# Patient Record
Sex: Female | Born: 1955
Health system: Southern US, Community
[De-identification: ages and names within clinical notes are randomized; demographics above are authoritative.]

## PROBLEM LIST (undated history)

## (undated) ENCOUNTER — Emergency Department (HOSPITAL_COMMUNITY): Discharge status: Absent without leave | Payer: 59

## (undated) DIAGNOSIS — T7840XA Allergy, unspecified, initial encounter: Secondary | ICD-10-CM

## (undated) DIAGNOSIS — Z8051 Family history of malignant neoplasm of kidney: Secondary | ICD-10-CM

## (undated) DIAGNOSIS — F32A Depression, unspecified: Secondary | ICD-10-CM

## (undated) DIAGNOSIS — K589 Irritable bowel syndrome without diarrhea: Secondary | ICD-10-CM

## (undated) DIAGNOSIS — K219 Gastro-esophageal reflux disease without esophagitis: Secondary | ICD-10-CM

## (undated) DIAGNOSIS — Z923 Personal history of irradiation: Secondary | ICD-10-CM

## (undated) DIAGNOSIS — G43909 Migraine, unspecified, not intractable, without status migrainosus: Secondary | ICD-10-CM

## (undated) DIAGNOSIS — F329 Major depressive disorder, single episode, unspecified: Secondary | ICD-10-CM

## (undated) DIAGNOSIS — Z800 Family history of malignant neoplasm of digestive organs: Secondary | ICD-10-CM

## (undated) DIAGNOSIS — Z803 Family history of malignant neoplasm of breast: Secondary | ICD-10-CM

## (undated) DIAGNOSIS — C50919 Malignant neoplasm of unspecified site of unspecified female breast: Secondary | ICD-10-CM

## (undated) DIAGNOSIS — M8580 Other specified disorders of bone density and structure, unspecified site: Secondary | ICD-10-CM

## (undated) DIAGNOSIS — H269 Unspecified cataract: Secondary | ICD-10-CM

## (undated) DIAGNOSIS — F419 Anxiety disorder, unspecified: Secondary | ICD-10-CM

## (undated) DIAGNOSIS — Z8041 Family history of malignant neoplasm of ovary: Secondary | ICD-10-CM

## (undated) DIAGNOSIS — K2270 Barrett's esophagus without dysplasia: Secondary | ICD-10-CM

## (undated) HISTORY — DX: Family history of malignant neoplasm of digestive organs: Z80.0

## (undated) HISTORY — DX: Migraine, unspecified, not intractable, without status migrainosus: G43.909

## (undated) HISTORY — DX: Family history of malignant neoplasm of kidney: Z80.51

## (undated) HISTORY — DX: Family history of malignant neoplasm of ovary: Z80.41

## (undated) HISTORY — DX: Gastro-esophageal reflux disease without esophagitis: K21.9

## (undated) HISTORY — PX: EYE SURGERY: SHX253

## (undated) HISTORY — DX: Barrett's esophagus without dysplasia: K22.70

## (undated) HISTORY — DX: Other specified disorders of bone density and structure, unspecified site: M85.80

## (undated) HISTORY — DX: Irritable bowel syndrome, unspecified: K58.9

## (undated) HISTORY — DX: Depression, unspecified: F32.A

## (undated) HISTORY — PX: CYSTOSCOPY: SUR368

## (undated) HISTORY — DX: Family history of malignant neoplasm of breast: Z80.3

## (undated) HISTORY — DX: Allergy, unspecified, initial encounter: T78.40XA

## (undated) HISTORY — PX: BREAST LUMPECTOMY: SHX2

## (undated) HISTORY — DX: Major depressive disorder, single episode, unspecified: F32.9

## (undated) HISTORY — DX: Unspecified cataract: H26.9

---
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
## 1976-04-05 HISTORY — PX: MOUTH SURGERY: SHX715

## 1994-04-05 HISTORY — PX: CERVICAL CONE BIOPSY: SUR198

## 1999-02-05 ENCOUNTER — Encounter: Admission: RE | Admit: 1999-02-05 | Discharge: 1999-02-05 | Payer: Self-pay | Admitting: Obstetrics and Gynecology

## 1999-02-05 ENCOUNTER — Encounter: Payer: Self-pay | Admitting: Obstetrics and Gynecology

## 2000-05-12 ENCOUNTER — Encounter: Admission: RE | Admit: 2000-05-12 | Discharge: 2000-05-12 | Payer: Self-pay | Admitting: Obstetrics and Gynecology

## 2000-05-12 ENCOUNTER — Encounter: Payer: Self-pay | Admitting: Obstetrics and Gynecology

## 2000-06-29 ENCOUNTER — Encounter: Payer: Self-pay | Admitting: Family Medicine

## 2000-06-29 ENCOUNTER — Ambulatory Visit (HOSPITAL_COMMUNITY): Admission: RE | Admit: 2000-06-29 | Discharge: 2000-06-29 | Payer: Self-pay | Admitting: Family Medicine

## 2001-06-26 ENCOUNTER — Ambulatory Visit (HOSPITAL_COMMUNITY): Admission: RE | Admit: 2001-06-26 | Discharge: 2001-06-26 | Payer: Self-pay | Admitting: Family Medicine

## 2001-06-26 ENCOUNTER — Encounter: Payer: Self-pay | Admitting: Family Medicine

## 2001-08-07 ENCOUNTER — Encounter: Admission: RE | Admit: 2001-08-07 | Discharge: 2001-08-07 | Payer: Self-pay | Admitting: Obstetrics and Gynecology

## 2001-08-07 ENCOUNTER — Encounter: Payer: Self-pay | Admitting: Obstetrics and Gynecology

## 2002-05-28 ENCOUNTER — Ambulatory Visit (HOSPITAL_COMMUNITY): Admission: RE | Admit: 2002-05-28 | Discharge: 2002-05-28 | Payer: Self-pay | Admitting: Gastroenterology

## 2002-05-28 ENCOUNTER — Encounter (INDEPENDENT_AMBULATORY_CARE_PROVIDER_SITE_OTHER): Payer: Self-pay | Admitting: *Deleted

## 2002-06-07 ENCOUNTER — Encounter: Payer: Self-pay | Admitting: Gastroenterology

## 2002-06-07 ENCOUNTER — Encounter: Admission: RE | Admit: 2002-06-07 | Discharge: 2002-06-07 | Payer: Self-pay | Admitting: Gastroenterology

## 2003-03-06 ENCOUNTER — Encounter: Admission: RE | Admit: 2003-03-06 | Discharge: 2003-03-06 | Payer: Self-pay | Admitting: Obstetrics and Gynecology

## 2004-07-06 ENCOUNTER — Encounter: Admission: RE | Admit: 2004-07-06 | Discharge: 2004-07-06 | Payer: Self-pay | Admitting: Obstetrics and Gynecology

## 2007-03-20 ENCOUNTER — Encounter: Admission: RE | Admit: 2007-03-20 | Discharge: 2007-03-20 | Payer: Self-pay | Admitting: Specialist

## 2007-07-21 HISTORY — PX: COLONOSCOPY: SHX174

## 2009-09-04 ENCOUNTER — Ambulatory Visit (HOSPITAL_COMMUNITY): Admission: RE | Admit: 2009-09-04 | Discharge: 2009-09-04 | Payer: Self-pay | Admitting: Obstetrics and Gynecology

## 2010-03-18 ENCOUNTER — Ambulatory Visit (HOSPITAL_COMMUNITY)
Admission: RE | Admit: 2010-03-18 | Discharge: 2010-03-18 | Payer: Self-pay | Source: Home / Self Care | Attending: Family Medicine | Admitting: Family Medicine

## 2010-04-26 ENCOUNTER — Encounter: Payer: Self-pay | Admitting: Family Medicine

## 2010-08-18 ENCOUNTER — Other Ambulatory Visit: Payer: Self-pay | Admitting: Family Medicine

## 2010-08-18 DIAGNOSIS — Z1231 Encounter for screening mammogram for malignant neoplasm of breast: Secondary | ICD-10-CM

## 2010-08-21 NOTE — Op Note (Signed)
NAME:  Carolyn Cooper, Carolyn Cooper                         ACCOUNT NO.:  [id number]   MEDICAL RECORD NO.:  [id number]                   PATIENT TYPE:  AMB   LOCATION:  ENDO                                 FACILITY:  [facility]   PHYSICIAN:  Petra Kuba, M.D.                 DATE OF BIRTH:  18-Jun-1955   DATE OF PROCEDURE:  05/28/2002  DATE OF DISCHARGE:                                 OPERATIVE REPORT   PROCEDURE:  Colonoscopy with biopsy.   ENDOSCOPIST:  Petra Kuba, M.D.   INDICATIONS FOR PROCEDURE:  Bright red blood per  rectum, diarrhea.  A consent was signed after the risks, benefits, methods and options were  thoroughly discussed in the office.   MEDICINES USED:  Demerol 60 mg, Versed 6 mg.   DESCRIPTION OF PROCEDURE:  The rectal inspection was pertinent for external  hemorrhoids, small.  A digital examination was negative.  The pediatric  adjustable colonoscope was inserted and advanced around the colon to the  cecum.  This did require rolling her on her back and some abdominal  pressure.  No obvious abnormality was seen on insertion.  The cecum was  identified by the appendiceal orifice and the ileocecal valve.  In fact the  scope was inserted a very short ways into the terminal ileum which was  normal.  Photo documentation and biopsies were obtained.  The scope was  slowly withdrawn.  Random colon biopsies were obtained and put in a second  container.  The prep was adequate.  There was some liquid stool that  required washing and suctioning.  The scope was slowly withdrawn through the  colon.  No abnormalities were seen.  Specifically, no polyps, masses,  diverticula, or signs of colitis.  Once back in the rectum, the scope was  retroflexed.  There were some internal hemorrhoids.  The scope was drained  and readvanced a short ways up the left side of the colon.  Air was  suctioned and the scope removed.  The patient tolerated the procedure well.  There was no obvious immediate  complication.   ENDOSCOPIC ASSESSMENT:  1. Internal and external hemorrhoids.  2. Otherwise within normal limits to the terminal ileum, status post ransom     biopsies throughout.   PLAN:  1. Await pathology to rule out microscopic abnormality.  2. A trial of antispasmodics.  3.     Probably will proceed with an upper GI, small bowel follow-through next.  4. Have her to see me back p.r.n. or in two months, recheck symptoms.  Make     sure that no further workup plans are needed.                                               Lavada Mesi  Magod, M.D.    MEM/MEDQ  D:  05/28/2002  T:  05/28/2002  Job:  [id number]   cc:   Magnus Sinning. Dimple Casey, M.D.  [address] [place]  [place] [address]  Fax: [phone]

## 2010-09-08 ENCOUNTER — Ambulatory Visit (HOSPITAL_COMMUNITY)
Admission: RE | Admit: 2010-09-08 | Discharge: 2010-09-08 | Disposition: A | Discharge status: Home / Self Care | Payer: 59 | Source: Ambulatory Visit | Attending: Family Medicine | Admitting: Family Medicine

## 2010-09-08 DIAGNOSIS — Z1231 Encounter for screening mammogram for malignant neoplasm of breast: Secondary | ICD-10-CM

## 2010-09-28 ENCOUNTER — Ambulatory Visit (INDEPENDENT_AMBULATORY_CARE_PROVIDER_SITE_OTHER): Payer: 59 | Admitting: Gynecology

## 2010-09-28 DIAGNOSIS — K648 Other hemorrhoids: Secondary | ICD-10-CM

## 2010-09-28 DIAGNOSIS — N950 Postmenopausal bleeding: Secondary | ICD-10-CM

## 2010-10-01 ENCOUNTER — Ambulatory Visit (HOSPITAL_COMMUNITY)
Admission: RE | Admit: 2010-10-01 | Discharge: 2010-10-01 | Disposition: A | Discharge status: Home / Self Care | Payer: 59 | Source: Ambulatory Visit | Attending: Family Medicine | Admitting: Family Medicine

## 2010-10-01 DIAGNOSIS — Z1231 Encounter for screening mammogram for malignant neoplasm of breast: Secondary | ICD-10-CM | POA: Insufficient documentation

## 2010-10-06 ENCOUNTER — Other Ambulatory Visit: Payer: 59

## 2010-10-06 ENCOUNTER — Other Ambulatory Visit: Payer: Self-pay | Admitting: Gynecology

## 2010-10-06 ENCOUNTER — Ambulatory Visit (INDEPENDENT_AMBULATORY_CARE_PROVIDER_SITE_OTHER): Payer: 59 | Admitting: Gynecology

## 2010-10-06 DIAGNOSIS — N882 Stricture and stenosis of cervix uteri: Secondary | ICD-10-CM

## 2010-10-06 DIAGNOSIS — N800 Endometriosis of uterus: Secondary | ICD-10-CM

## 2010-10-06 DIAGNOSIS — N83339 Acquired atrophy of ovary and fallopian tube, unspecified side: Secondary | ICD-10-CM

## 2010-10-06 DIAGNOSIS — N950 Postmenopausal bleeding: Secondary | ICD-10-CM

## 2011-06-07 ENCOUNTER — Ambulatory Visit: Payer: 59 | Attending: Family Medicine | Admitting: Physical Therapy

## 2011-06-07 DIAGNOSIS — M25676 Stiffness of unspecified foot, not elsewhere classified: Secondary | ICD-10-CM | POA: Insufficient documentation

## 2011-06-07 DIAGNOSIS — R5381 Other malaise: Secondary | ICD-10-CM | POA: Insufficient documentation

## 2011-06-07 DIAGNOSIS — IMO0001 Reserved for inherently not codable concepts without codable children: Secondary | ICD-10-CM | POA: Insufficient documentation

## 2011-06-07 DIAGNOSIS — M25673 Stiffness of unspecified ankle, not elsewhere classified: Secondary | ICD-10-CM | POA: Insufficient documentation

## 2011-06-07 DIAGNOSIS — M25579 Pain in unspecified ankle and joints of unspecified foot: Secondary | ICD-10-CM | POA: Insufficient documentation

## 2011-06-10 ENCOUNTER — Ambulatory Visit: Payer: 59 | Admitting: Physical Therapy

## 2011-06-14 ENCOUNTER — Ambulatory Visit: Payer: 59 | Admitting: Physical Therapy

## 2011-06-17 ENCOUNTER — Ambulatory Visit: Payer: 59 | Admitting: *Deleted

## 2011-06-21 ENCOUNTER — Ambulatory Visit: Payer: 59 | Admitting: Physical Therapy

## 2011-09-03 ENCOUNTER — Ambulatory Visit (INDEPENDENT_AMBULATORY_CARE_PROVIDER_SITE_OTHER): Payer: 59 | Admitting: Gynecology

## 2011-09-03 ENCOUNTER — Encounter: Payer: Self-pay | Admitting: Gynecology

## 2011-09-03 VITALS — BP 112/78 | Ht 65.25 in | Wt 164.0 lb

## 2011-09-03 DIAGNOSIS — M899 Disorder of bone, unspecified: Secondary | ICD-10-CM

## 2011-09-03 DIAGNOSIS — M8580 Other specified disorders of bone density and structure, unspecified site: Secondary | ICD-10-CM

## 2011-09-03 DIAGNOSIS — Z01419 Encounter for gynecological examination (general) (routine) without abnormal findings: Secondary | ICD-10-CM

## 2011-09-03 DIAGNOSIS — Z79890 Hormone replacement therapy (postmenopausal): Secondary | ICD-10-CM

## 2011-09-03 DIAGNOSIS — K644 Residual hemorrhoidal skin tags: Secondary | ICD-10-CM

## 2011-09-03 MED ORDER — MEDROXYPROGESTERONE ACETATE 2.5 MG PO TABS: 2.5000 mg | ORAL_TABLET | Freq: Every day | ORAL | Status: DC

## 2011-09-03 MED ORDER — ESTRADIOL 1 MG PO TABS: 1.0000 mg | ORAL_TABLET | Freq: Every day | ORAL | Status: DC

## 2011-09-03 NOTE — Patient Instructions (Signed)
Wean from hormone replacement as we discussed. Follow up if any issues. Follow up for mammogram as scheduled. Follow up for repeat bone density as discussed with your primary physician. Follow up for repeat annual gynecologic exam in 1 year

## 2011-09-03 NOTE — Progress Notes (Signed)
Carolyn Cooper 06-06-1955 [id number]        56 y.o.  for annual exam.  Several issues noted below.  Past medical history,surgical history, medications, allergies, family history and social history were all reviewed and documented in the EPIC chart. ROS:  Was performed and pertinent positives and negatives are included in the history.  Exam: Carolyn Cooper chaperone present Filed Vitals:   09/03/11 1115  BP: 112/78   General appearance  Normal Skin grossly normal Head/Neck normal with no cervical or supraclavicular adenopathy thyroid normal Lungs  clear Cardiac RR, without RMG Abdominal  soft, nontender, without masses, organomegaly or hernia Breasts  examined lying and sitting without masses, retractions, discharge or axillary adenopathy. Pelvic  Ext/BUS/vagina  normal   Cervix  normal with stenotic os noted  Uterus  axial, normal size, shape and contour, midline and mobile nontender   Adnexa  Without masses or tenderness    Anus and perineum  normal   Rectovaginal  normal sphincter tone without palpated masses or tenderness. External hemorrhoids noted.   Assessment/Plan:  56 y.o. female for annual exam.    1. HRT. Patient continues on Estrace 1 mg and Provera 2.5 mg. I again reviewed the issues of HRT in the WHI study with increased risk of stroke, heart attack, DVT and breast cancer. Patient wants to try to wean. I suggested decreasing 2.5 mg x2 weeks of Estrace then every other night then stopping. If she does well that she will stay off of this. If she has resumption of significant hot flushes she will restart her HRT accepting the risks as we reviewed. She did have a light menses in October. She underwent sonohysterogram with endometrial biopsy July 2012 which showed proliferative endometrium and no cavity defects. She's done no bleeding since October. 2. Mammography. Patient is due for mammogram in July and she knows to schedule this. SBE monthly reviewed. 3. Pap smear. Patient does have  history of cone biopsy in 96 with normal Pap smears since then. Her last Pap smear was April 2012 which was normal with negative HPV screen.  Will plan repeat at 5 your interval per current screening guidelines. 4. Osteopenia. Patient reports last DEXA 5 years ago through her primary physician's office. Have recommended repeat now she: Arrange this with them. Vitamin D level last year good at 65. 5. Colonoscopy. She is up-to-date with screening and is due to have this repeated in 2 years. 6. Health maintenance. No blood work was ordered today as she has a normal lipid profile glucose TSH last year. Assuming she continues well from a gynecologic standpoint and she will see me in a year, sooner as needed.    Dara Lords MD, 11:36 AM 09/03/2011

## 2011-09-04 LAB — URINALYSIS W MICROSCOPIC + REFLEX CULTURE
Bilirubin Urine: NEGATIVE
Casts: NONE SEEN
Glucose, UA: NEGATIVE mg/dL
Hgb urine dipstick: NEGATIVE
Leukocytes, UA: NEGATIVE
Protein, ur: NEGATIVE mg/dL
pH: 6 (ref 5.0–8.0)

## 2011-09-08 ENCOUNTER — Other Ambulatory Visit: Payer: Self-pay | Admitting: Gynecology

## 2011-09-08 DIAGNOSIS — Z1231 Encounter for screening mammogram for malignant neoplasm of breast: Secondary | ICD-10-CM

## 2011-10-05 ENCOUNTER — Ambulatory Visit (HOSPITAL_COMMUNITY)
Admission: RE | Admit: 2011-10-05 | Discharge: 2011-10-05 | Disposition: A | Discharge status: Home / Self Care | Payer: 59 | Source: Ambulatory Visit | Attending: Gynecology | Admitting: Gynecology

## 2011-10-05 DIAGNOSIS — Z1231 Encounter for screening mammogram for malignant neoplasm of breast: Secondary | ICD-10-CM | POA: Insufficient documentation

## 2011-12-01 ENCOUNTER — Encounter: Payer: Self-pay | Admitting: Gynecology

## 2012-04-29 ENCOUNTER — Telehealth: Payer: Self-pay

## 2012-04-29 NOTE — Telephone Encounter (Signed)
Pt needs refill on her adderall please call her at [phone]

## 2012-05-01 NOTE — Telephone Encounter (Signed)
Please pull paper chart.  

## 2012-05-01 NOTE — Telephone Encounter (Signed)
Message taken for wrong patient

## 2012-07-14 ENCOUNTER — Ambulatory Visit (INDEPENDENT_AMBULATORY_CARE_PROVIDER_SITE_OTHER): Payer: 59 | Admitting: *Deleted

## 2012-07-14 ENCOUNTER — Telehealth: Payer: Self-pay | Admitting: *Deleted

## 2012-07-14 VITALS — BP 112/73 | HR 84 | Temp 98.1°F

## 2012-07-14 DIAGNOSIS — R110 Nausea: Secondary | ICD-10-CM

## 2012-07-14 MED ORDER — PROMETHAZINE HCL 25 MG/ML IJ SOLN
25.0000 mg | Freq: Once | INTRAMUSCULAR | Status: AC
  Administered 2012-07-14: 25 mg via INTRAMUSCULAR

## 2012-07-14 NOTE — Patient Instructions (Addendum)
Viral Gastroenteritis Viral gastroenteritis is also known as stomach flu. This condition affects the stomach and intestinal tract. It can cause sudden diarrhea and vomiting. The illness typically lasts 3 to 8 days. Most people develop an immune response that eventually gets rid of the virus. While this natural response develops, the virus can make you quite ill. CAUSES  Many different viruses can cause gastroenteritis, such as rotavirus or noroviruses. You can catch one of these viruses by consuming contaminated food or water. You may also catch a virus by sharing utensils or other personal items with an infected person or by touching a contaminated surface. SYMPTOMS  The most common symptoms are diarrhea and vomiting. These problems can cause a severe loss of body fluids (dehydration) and a body salt (electrolyte) imbalance. Other symptoms may include:  Fever.  Headache.  Fatigue.  Abdominal pain. DIAGNOSIS  Your caregiver can usually diagnose viral gastroenteritis based on your symptoms and a physical exam. A stool sample may also be taken to test for the presence of viruses or other infections. TREATMENT  This illness typically goes away on its own. Treatments are aimed at rehydration. The most serious cases of viral gastroenteritis involve vomiting so severely that you are not able to keep fluids down. In these cases, fluids must be given through an intravenous line (IV). HOME CARE INSTRUCTIONS   Drink enough fluids to keep your urine clear or pale yellow. Drink small amounts of fluids frequently and increase the amounts as tolerated.  Ask your caregiver for specific rehydration instructions.  Avoid:  Foods high in sugar.  Alcohol.  Carbonated drinks.  Tobacco.  Juice.  Caffeine drinks.  Extremely hot or cold fluids.  Fatty, greasy foods.  Too much intake of anything at one time.  Dairy products until 24 to 48 hours after diarrhea stops.  You may consume probiotics.  Probiotics are active cultures of beneficial bacteria. They may lessen the amount and number of diarrheal stools in adults. Probiotics can be found in yogurt with active cultures and in supplements.  Wash your hands well to avoid spreading the virus.  Only take over-the-counter or prescription medicines for pain, discomfort, or fever as directed by your caregiver. Do not give aspirin to children. Antidiarrheal medicines are not recommended.  Ask your caregiver if you should continue to take your regular prescribed and over-the-counter medicines.  Keep all follow-up appointments as directed by your caregiver. SEEK IMMEDIATE MEDICAL CARE IF:   You are unable to keep fluids down.  You do not urinate at least once every 6 to 8 hours.  You develop shortness of breath.  You notice blood in your stool or vomit. This may look like coffee grounds.  You have abdominal pain that increases or is concentrated in one small area (localized).  You have persistent vomiting or diarrhea.  You have a fever.  The patient is a child younger than 3 months, and he or she has a fever.  The patient is a child older than 3 months, and he or she has a fever and persistent symptoms.  The patient is a child older than 3 months, and he or she has a fever and symptoms suddenly get worse.  The patient is a baby, and he or she has no tears when crying. MAKE SURE YOU:   Understand these instructions.  Will watch your condition.  Will get help right away if you are not doing well or get worse. Document Released: 03/22/2005 Document Revised: 06/14/2011 Document Reviewed: 01/06/2011   ExitCare Patient Information 2013 [place], [place].   Promethazine injection What is this medicine? PROMETHAZINE (proe METH a zeen) is an antihistamine. It is used to treat allergic reactions and to treat or prevent nausea and vomiting from illness or motion sickness. It is also used to make you sleep before surgery, and to help  treat pain or nausea after surgery. This medicine may be used for other purposes; ask your health care provider or pharmacist if you have questions. What should I tell my health care provider before I take this medicine? They need to know if you have any of these conditions: -glaucoma -high blood pressure or heart disease -kidney disease -liver disease -lung or breathing disease, like asthma -prostate trouble -pain or difficulty passing urine -seizures -an unusual or allergic reaction to promethazine or phenothiazines, other medicines, foods, dyes, or preservatives -pregnant or trying to get pregnant -breast-feeding How should I use this medicine? This medicine is for injection into a muscle, or into a vein. It is given by a health care professional in a hospital or clinic setting. Talk to your pediatrician regarding the use of this medicine in children. This medicine should not be given to infants and children younger than 36 years old. Overdosage: If you think you have taken too much of this medicine contact a poison control center or emergency room at once. NOTE: This medicine is only for you. Do not share this medicine with others. What if I miss a dose? This does not apply. What may interact with this medicine? Do not take this medicine with any of the following medications: -medicines called MAO Inhibitors like Nardil, Parnate, Marplan, Eldepryl -other phenothiazines like trimethobenzamide This medicine may also interact with the following medications: -barbiturates like phenobarbital -bromocriptine -certain antidepressants -certain antihistamines used in allergy or cold medicines -epinephrine -levodopa -medicines for sleep -medicines for mental problems and psychotic disturbances -medicines for movement abnormalities as in Parkinson's disease, or for gastrointestinal problems -muscle relaxants -prescription pain medicines This list may not describe all possible  interactions. Give your health care provider a list of all the medicines, herbs, non-prescription drugs, or dietary supplements you use. Also tell them if you smoke, drink alcohol, or use illegal drugs. Some items may interact with your medicine. What should I watch for while using this medicine? Your condition will be monitored carefully while you are receiving this medicine. Your healthcare professional will discuss with you the risks and the benefits of using this medicine. This medicine has caused serious side effects in some patients after it was injected into a vein. Watch closely for any signs or symptoms of a local reaction like burning, pain, redness, swelling, and blistering and tell your healthcare professional immediately if any occur. These symptoms may occur when you receive the injection or may occur hours or even days after the injection. You may get drowsy or dizzy. Do not drive, use machinery, or do anything that needs mental alertness until you know how this medicine affects you. To reduce the risk of dizzy or fainting spells, do not stand or sit up quickly, especially if you are an older patient. Alcohol may increase dizziness and drowsiness. Avoid alcoholic drinks. Your mouth may get dry. Chewing sugarless gum or sucking hard candy, and drinking plenty of water will help. This medicine may cause dry eyes and blurred vision. If you wear contact lenses you may feel some discomfort. Lubricating drops may help. See your eye doctor if the problem does not go away or is severe.  Keep out of the sun, or wear protective clothing outdoors and use a sunscreen. Do not use sun lamps or sun tanning beds or booths. If you are diabetic, check your blood-sugar levels regularly. What side effects may I notice from receiving this medicine? Side effects that you should report to your doctor or health care professional as soon as possible: -allergic reactions like skin rash, itching or hives, swelling of  the face, lips, or tongue -blurred vision -burning, blistering, pain, redness, and/or swelling at the injection site -irregular heartbeat, palpitations or chest pain -muscle or facial twitches -pain or difficulty passing urine -seizures -slowed or shallow breathing -unusual bleeding or bruising -yellowing of the eyes or skin Side effects that usually do not require medical attention (report to your doctor or health care professional if they continue or are bothersome): -headache -nightmares, agitation, nervousness, excitability, not able to sleep (these are more likely in children) -stuffy nose This list may not describe all possible side effects. Call your doctor for medical advice about side effects. You may report side effects to FDA at 1-800-FDA-1088. Where should I keep my medicine? This drug is given in a hospital or clinic and will not be stored at home. NOTE: This sheet is a summary. It may not cover all possible information. If you have questions about this medicine, talk to your doctor, pharmacist, or health care provider.  2013, Elsevier/Gold Standard. (12/20/2007 4:23:31 PM)  Patient to go home and rest.

## 2012-07-14 NOTE — Progress Notes (Signed)
Pt complains of abd discomfort and nausea x 1 day.  Has not vomited at this point.  Several close contacts have had vomiting and diarrhea over the past couple of days.  She is requesting something for nausea.

## 2012-07-14 NOTE — Telephone Encounter (Signed)
Discussed with Dr. Christell Constant patient will receive Phenergan 25mg  injection and will go home to rest.

## 2012-08-15 ENCOUNTER — Ambulatory Visit: Payer: 59

## 2012-08-16 ENCOUNTER — Ambulatory Visit: Payer: 59

## 2012-08-21 ENCOUNTER — Ambulatory Visit: Payer: 59

## 2012-09-11 ENCOUNTER — Telehealth: Payer: Self-pay

## 2012-09-11 NOTE — Telephone Encounter (Signed)
error 

## 2012-09-14 ENCOUNTER — Other Ambulatory Visit: Payer: Self-pay | Admitting: Gynecology

## 2012-09-22 ENCOUNTER — Other Ambulatory Visit: Payer: Self-pay | Admitting: Nurse Practitioner

## 2012-09-25 ENCOUNTER — Encounter: Payer: Self-pay | Admitting: Family Medicine

## 2012-10-03 ENCOUNTER — Other Ambulatory Visit: Payer: Self-pay | Admitting: Gynecology

## 2012-10-03 DIAGNOSIS — Z1231 Encounter for screening mammogram for malignant neoplasm of breast: Secondary | ICD-10-CM

## 2012-10-11 ENCOUNTER — Ambulatory Visit (INDEPENDENT_AMBULATORY_CARE_PROVIDER_SITE_OTHER): Payer: 59 | Admitting: Gynecology

## 2012-10-11 ENCOUNTER — Encounter: Payer: Self-pay | Admitting: Gynecology

## 2012-10-11 VITALS — BP 122/74 | Ht 64.75 in | Wt 167.0 lb

## 2012-10-11 DIAGNOSIS — M899 Disorder of bone, unspecified: Secondary | ICD-10-CM

## 2012-10-11 DIAGNOSIS — M8580 Other specified disorders of bone density and structure, unspecified site: Secondary | ICD-10-CM

## 2012-10-11 DIAGNOSIS — Z79890 Hormone replacement therapy (postmenopausal): Secondary | ICD-10-CM

## 2012-10-11 DIAGNOSIS — Z01419 Encounter for gynecological examination (general) (routine) without abnormal findings: Secondary | ICD-10-CM

## 2012-10-11 DIAGNOSIS — Z13220 Encounter for screening for lipoid disorders: Secondary | ICD-10-CM

## 2012-10-11 DIAGNOSIS — M949 Disorder of cartilage, unspecified: Secondary | ICD-10-CM

## 2012-10-11 LAB — LIPID PANEL
LDL Cholesterol: 88 mg/dL (ref 0–99)
Total CHOL/HDL Ratio: 2.5 Ratio
VLDL: 16 mg/dL (ref 0–40)

## 2012-10-11 LAB — TSH: TSH: 2.444 u[IU]/mL (ref 0.350–4.500)

## 2012-10-11 LAB — COMPREHENSIVE METABOLIC PANEL
ALT: 11 U/L (ref 0–35)
AST: 16 U/L (ref 0–37)
Alkaline Phosphatase: 89 U/L (ref 39–117)
Creat: 0.79 mg/dL (ref 0.50–1.10)
Sodium: 141 mEq/L (ref 135–145)
Total Bilirubin: 0.3 mg/dL (ref 0.3–1.2)

## 2012-10-11 LAB — CBC WITH DIFFERENTIAL/PLATELET
Basophils Absolute: 0.1 10*3/uL (ref 0.0–0.1)
Eosinophils Absolute: 0.1 10*3/uL (ref 0.0–0.7)
Eosinophils Relative: 1 % (ref 0–5)
Lymphocytes Relative: 25 % (ref 12–46)
MCH: 31.8 pg (ref 26.0–34.0)
MCV: 92.6 fL (ref 78.0–100.0)
Neutrophils Relative %: 66 % (ref 43–77)
Platelets: 287 10*3/uL (ref 150–400)
RDW: 13.2 % (ref 11.5–15.5)
WBC: 7.7 10*3/uL (ref 4.0–10.5)

## 2012-10-11 MED ORDER — ESTRADIOL 1 MG PO TABS: 1.0000 mg | ORAL_TABLET | Freq: Every day | ORAL | Status: DC

## 2012-10-11 MED ORDER — MEDROXYPROGESTERONE ACETATE 2.5 MG PO TABS: ORAL_TABLET | ORAL | Status: DC

## 2012-10-11 NOTE — Progress Notes (Signed)
Carolyn Cooper [birth year] [id number]        57 y.o.  G4P4 for annual exam.  Several issues noted below.  Past medical history,surgical history, medications, allergies, family history and social history were all reviewed and documented in the EPIC chart.  ROS:  Performed and pertinent positives and negatives are included in the history, assessment and plan .  Exam: Biomedical scientist Filed Vitals:   10/11/12 1501  BP: 122/74  Height: 5' 4.75" (1.645 m)  Weight: 167 lb (75.751 kg)   General appearance  Normal Skin grossly normal Head/Neck normal with no cervical or supraclavicular adenopathy thyroid normal Lungs  clear Cardiac RR, without RMG Abdominal  soft, nontender, without masses, organomegaly or hernia Breasts  examined lying and sitting without masses, retractions, discharge or axillary adenopathy. Pelvic  Ext/BUS/vagina  normal   Cervix  normal   Uterus  anteverted, normal size, shape and contour, midline and mobile nontender   Adnexa  Without masses or tenderness    Anus and perineum  normal   Rectovaginal  normal sphincter tone without palpated masses or tenderness.    Assessment/Plan:  57 y.o. G46P4 female for annual exam.   1. HRT. Patient is on Estrace 1 mg and Provera 2.5 mg doing well. She had tried weaning this past year and had unacceptable hot flushes and restarted.  I again reviewed the whole issue of HRT with her to include the WHI study with increased risk of stroke, heart attack, DVT and breast cancer. The ACOG and NAMS statements for lowest dose for the shortest period of time reviewed. Transdermal versus oral first-pass effect benefit discussed as well as whether naturalized progesterone is "safer". She has been on the current regimen for some time and prefers to remain on this.. I did ask her again during the winter months to try weaning and she will go ahead and do this. I refilled her estradiol 1 mg and Provera 2.5 mg daily. She's done no bleeding and knows to  report any vaginal bleeding. 2. Osteopenia. DEXA 2012 at her work showed T score -1.3. Recommend repeat this year at a 2 year interval. Increase calcium vitamin D reviewed. Check vitamin D level today. 3. Mammography due now and patient is to schedule this. SBE monthly reviewed. 4. Pap smear 2012. No Pap smear done today. Patient does have history of cone biopsy 1996 with subsequent Pap smears normal. Plan repeat Pap smear next year 3 year interval. 5. Colonoscopy 4 years ago. Repeat at their recommended interval. 6. Health maintenance. Baseline CBC comprehensive metabolic panel lipid profile TSH vitamin D urinalysis ordered. Followup one year, sooner as needed.    Note: This document was prepared with digital dictation and possible smart phrase technology. Any transcriptional errors that result from this process are unintentional.   Dara Lords MD, 3:40 PM 10/11/2012

## 2012-10-11 NOTE — Patient Instructions (Signed)
Followup in one year, sooner as needed. Repeat the bone density at your work this year.

## 2012-10-12 ENCOUNTER — Encounter: Payer: Self-pay | Admitting: Gynecology

## 2012-10-12 LAB — URINALYSIS W MICROSCOPIC + REFLEX CULTURE
Glucose, UA: NEGATIVE mg/dL
Leukocytes, UA: NEGATIVE
Nitrite: NEGATIVE
Protein, ur: NEGATIVE mg/dL
Urobilinogen, UA: 0.2 mg/dL (ref 0.0–1.0)

## 2012-10-12 LAB — VITAMIN D 25 HYDROXY (VIT D DEFICIENCY, FRACTURES): Vit D, 25-Hydroxy: 40 ng/mL (ref 30–89)

## 2012-10-16 ENCOUNTER — Telehealth: Payer: Self-pay | Admitting: *Deleted

## 2012-10-17 ENCOUNTER — Other Ambulatory Visit: Payer: Self-pay | Admitting: Family Medicine

## 2012-10-17 DIAGNOSIS — Z780 Asymptomatic menopausal state: Secondary | ICD-10-CM

## 2012-10-17 NOTE — Telephone Encounter (Signed)
Done

## 2012-10-20 ENCOUNTER — Ambulatory Visit (HOSPITAL_COMMUNITY)
Admission: RE | Admit: 2012-10-20 | Discharge: 2012-10-20 | Disposition: A | Discharge status: Home / Self Care | Payer: 59 | Source: Ambulatory Visit | Attending: Gynecology | Admitting: Gynecology

## 2012-10-20 DIAGNOSIS — Z1231 Encounter for screening mammogram for malignant neoplasm of breast: Secondary | ICD-10-CM | POA: Insufficient documentation

## 2012-10-23 ENCOUNTER — Other Ambulatory Visit: Payer: Self-pay | Admitting: Gynecology

## 2012-10-23 DIAGNOSIS — R928 Other abnormal and inconclusive findings on diagnostic imaging of breast: Secondary | ICD-10-CM

## 2012-10-25 ENCOUNTER — Ambulatory Visit (HOSPITAL_COMMUNITY)
Admission: RE | Admit: 2012-10-25 | Discharge: 2012-10-25 | Disposition: A | Discharge status: Home / Self Care | Payer: 59 | Source: Ambulatory Visit | Attending: Gynecology | Admitting: Gynecology

## 2012-10-25 ENCOUNTER — Other Ambulatory Visit (HOSPITAL_COMMUNITY): Payer: Self-pay | Admitting: Gynecology

## 2012-10-25 DIAGNOSIS — R928 Other abnormal and inconclusive findings on diagnostic imaging of breast: Secondary | ICD-10-CM | POA: Insufficient documentation

## 2012-10-31 ENCOUNTER — Other Ambulatory Visit: Payer: 59

## 2012-11-03 ENCOUNTER — Other Ambulatory Visit: Payer: 59

## 2012-11-24 ENCOUNTER — Ambulatory Visit (INDEPENDENT_AMBULATORY_CARE_PROVIDER_SITE_OTHER): Payer: 59 | Admitting: Physician Assistant

## 2012-11-24 ENCOUNTER — Encounter: Payer: Self-pay | Admitting: Physician Assistant

## 2012-11-24 VITALS — BP 116/71 | HR 65 | Temp 97.6°F | Ht 64.75 in | Wt 167.0 lb

## 2012-11-24 DIAGNOSIS — R21 Rash and other nonspecific skin eruption: Secondary | ICD-10-CM

## 2012-11-24 DIAGNOSIS — L0100 Impetigo, unspecified: Secondary | ICD-10-CM

## 2012-11-24 MED ORDER — MUPIROCIN 2 % EX OINT: TOPICAL_OINTMENT | Freq: Three times a day (TID) | CUTANEOUS | Status: DC

## 2012-11-24 NOTE — Progress Notes (Signed)
  Subjective:    Patient ID: Carolyn Cooper, female    DOB: 1955/09/27, 57 y.o.   MRN: [id number]  HPI 57 y/o female presents for recurrent & pruritic rash on nasolabial furrow x several months. Has been treated with Bactroban in Jan '14 w/ no relief. She states that it started on her chin and has progressed to her nasal area. No other contacts with similar symptoms.     Review of Systems  Constitutional: Negative for fever, chills and fatigue.  HENT: Negative for facial swelling, rhinorrhea, mouth sores and neck pain.   Skin: Positive for color change (erythema in AA) and rash (papular, patch rash). Negative for pallor and wound.       Objective:   Physical Exam PE reveals approximately 2cm x 1.5cm localized patch plaque lesion w/ papules and erythema. Minimal crusting. 2 separate papules are seen inferior to lower lip. Patient denies any other lesions on body surfaces.         Assessment & Plan:  1. Impetigo: Bacterial culture was taken to r/o staph or strep infection. Meanwhile, I will tx w/ Mupirocin 2% ointment Tid. If bacterial culture returns negative, I suggest culturing for herpetic origin and possible biopsy to confirm diagnosis of possible eczema/dermatitis. I also suggesting using OTC moisturizer such as Aveeno or Cetaphil. Clean with soap and water. Do not use any other creams besides ones prescribed. RTC if s/s do not improve.

## 2012-11-24 NOTE — Patient Instructions (Signed)
Use ointment 3 times daily on affected areas. Moisturize with a good moisturizer such as aveeno or cetaphil. Clean with soap and water. If does not improve with treatment, we will culture for herpetic origin or biopsy.

## 2012-11-26 LAB — AEROBIC CULTURE

## 2012-11-28 ENCOUNTER — Ambulatory Visit (INDEPENDENT_AMBULATORY_CARE_PROVIDER_SITE_OTHER): Payer: 59 | Admitting: Pharmacist Clinician (PhC)/ Clinical Pharmacy Specialist

## 2012-11-28 DIAGNOSIS — R635 Abnormal weight gain: Secondary | ICD-10-CM

## 2012-11-28 MED ORDER — PHENTERMINE HCL 30 MG PO CAPS: 30.0000 mg | ORAL_CAPSULE | ORAL | Status: DC

## 2012-11-28 NOTE — Progress Notes (Signed)
Patient presents today to discuss options on how to loose weight since going through menopausal and experiencing considerable weight gain.  She had taken phenertmine in past years with great success.  Patient is normotensive and has no contraindications to therapy with phentermine.  She did not experience hypertension or tachycardia when she took it in the past.  [facility] and have patient check her BP in office weekly to monitor adverse effects.  Will check in weight weekly as well.  Healthy diet and exercise reviewed with patient on visit today.

## 2012-12-14 ENCOUNTER — Telehealth: Payer: Self-pay | Admitting: *Deleted

## 2012-12-14 MED ORDER — VALACYCLOVIR HCL 1 G PO TABS: 1000.0000 mg | ORAL_TABLET | Freq: Two times a day (BID) | ORAL | Status: DC

## 2012-12-14 NOTE — Telephone Encounter (Signed)
CAN YOU CALL IN VALTREX TO [facility]. DEBBIE HAS SMALL ON BUMPS ON FACE THAT ARE DRAINING. THANKS.

## 2012-12-14 NOTE — Telephone Encounter (Signed)
[facility]ed [facility] 

## 2013-02-07 ENCOUNTER — Other Ambulatory Visit: Payer: Self-pay

## 2013-02-07 MED ORDER — ESTRADIOL 1 MG PO TABS: 1.0000 mg | ORAL_TABLET | Freq: Every day | ORAL | Status: DC

## 2013-02-19 ENCOUNTER — Other Ambulatory Visit: Payer: Self-pay | Admitting: Nurse Practitioner

## 2013-03-16 ENCOUNTER — Other Ambulatory Visit: Payer: Self-pay | Admitting: *Deleted

## 2013-03-16 MED ORDER — SUMATRIPTAN SUCCINATE 100 MG PO TABS: 100.0000 mg | ORAL_TABLET | ORAL | Status: DC | PRN

## 2013-03-22 ENCOUNTER — Ambulatory Visit: Payer: 59

## 2013-03-22 ENCOUNTER — Encounter: Payer: Self-pay | Admitting: Family Medicine

## 2013-03-22 ENCOUNTER — Ambulatory Visit (INDEPENDENT_AMBULATORY_CARE_PROVIDER_SITE_OTHER): Payer: 59 | Admitting: Family Medicine

## 2013-03-22 ENCOUNTER — Ambulatory Visit (INDEPENDENT_AMBULATORY_CARE_PROVIDER_SITE_OTHER): Payer: 59

## 2013-03-22 VITALS — BP 106/70 | HR 78 | Temp 99.0°F | Ht 65.5 in | Wt 162.0 lb

## 2013-03-22 DIAGNOSIS — M545 Low back pain: Secondary | ICD-10-CM

## 2013-03-22 DIAGNOSIS — S39012A Strain of muscle, fascia and tendon of lower back, initial encounter: Secondary | ICD-10-CM

## 2013-03-22 DIAGNOSIS — S335XXA Sprain of ligaments of lumbar spine, initial encounter: Secondary | ICD-10-CM

## 2013-03-22 MED ORDER — NAPROXEN 500 MG PO TABS: 500.0000 mg | ORAL_TABLET | Freq: Two times a day (BID) | ORAL | Status: DC

## 2013-03-22 MED ORDER — CYCLOBENZAPRINE HCL 10 MG PO TABS: 10.0000 mg | ORAL_TABLET | Freq: Three times a day (TID) | ORAL | Status: DC | PRN

## 2013-03-22 NOTE — Progress Notes (Signed)
   Subjective:    Patient ID: Carolyn Cooper, female    DOB: 07-24-1955, 57 y.o.   MRN: [id number]  HPI This 57 y.o. female presents for evaluation of back discomfort over her left lumbar spine and numbness And tingling radiating down to her right thigh.  She has this on occasion.  She has had a LS xray.   Review of Systems    No chest pain, SOB, HA, dizziness, vision change, N/V, diarrhea, constipation, dysuria, urinary urgency or frequency, myalgias, arthralgias or rash.  Objective:   Physical Exam  Vital signs noted  Well developed well nourished female.  HEENT - Head atraumatic Normocephalic                Eyes - PERRLA, Conjuctiva - clear Sclera- Clear EOMI                Ears - EAC's Wnl TM's Wnl Gross Hearing WNL                Throat - oropharanx wnl Respiratory - Lungs CTA bilateral Cardiac - RRR S1 and S2 without murmur GI - Abdomen soft Nontender and bowel sounds active x 4 MS - TTP LS spine right LS muscles.  Lumbar spine xray - No fracture Prelimnary reading by Angeline Slim      Assessment & Plan:  LBP radiating to right leg - Plan: DG Lumbar Spine 2-3 Views, cyclobenzaprine (FLEXERIL) 10 MG tablet, naproxen (NAPROSYN) 500 MG tablet  Lumbar strain, initial encounter - Plan: cyclobenzaprine (FLEXERIL) 10 MG tablet, naproxen (NAPROSYN) 500 MG tablet   Deatra Canter FNP

## 2013-03-26 ENCOUNTER — Ambulatory Visit: Payer: 59 | Admitting: Family Medicine

## 2013-03-28 ENCOUNTER — Encounter: Payer: Self-pay | Admitting: General Practice

## 2013-03-28 ENCOUNTER — Ambulatory Visit (INDEPENDENT_AMBULATORY_CARE_PROVIDER_SITE_OTHER): Payer: 59 | Admitting: General Practice

## 2013-03-28 VITALS — BP 126/79 | HR 77 | Temp 98.4°F

## 2013-03-28 DIAGNOSIS — N390 Urinary tract infection, site not specified: Secondary | ICD-10-CM

## 2013-03-28 DIAGNOSIS — IMO0001 Reserved for inherently not codable concepts without codable children: Secondary | ICD-10-CM

## 2013-03-28 DIAGNOSIS — R350 Frequency of micturition: Secondary | ICD-10-CM

## 2013-03-28 LAB — POCT URINALYSIS DIPSTICK
Bilirubin, UA: NEGATIVE
Nitrite, UA: NEGATIVE
Protein, UA: NEGATIVE
Urobilinogen, UA: NEGATIVE
pH, UA: 6.5

## 2013-03-28 LAB — POCT UA - MICROSCOPIC ONLY
Casts, Ur, LPF, POC: NEGATIVE
Crystals, Ur, HPF, POC: NEGATIVE
Yeast, UA: NEGATIVE

## 2013-03-28 MED ORDER — CIPROFLOXACIN HCL 500 MG PO TABS: 500.0000 mg | ORAL_TABLET | Freq: Two times a day (BID) | ORAL | Status: DC

## 2013-03-28 NOTE — Progress Notes (Signed)
   Subjective:    Patient ID: Carolyn Cooper, female    DOB: 01/11/1956, 57 y.o.   MRN: [id number]  Urinary Tract Infection  This is a new problem. The current episode started in the past 7 days. The problem has been gradually worsening. The quality of the pain is described as aching and burning. The pain is at a severity of 3/10. There has been no fever. She is not sexually active. There is no history of pyelonephritis. Associated symptoms include frequency and urgency. Pertinent negatives include no chills, flank pain, hematuria or nausea. She has tried nothing for the symptoms. There is no history of catheterization or recurrent UTIs.      Review of Systems  Constitutional: Negative for fever and chills.  Respiratory: Negative for chest tightness and shortness of breath.   Cardiovascular: Negative for chest pain and palpitations.  Gastrointestinal: Negative for nausea.  Genitourinary: Positive for urgency and frequency. Negative for hematuria and flank pain.  Neurological: Negative for dizziness, weakness and headaches.       Objective:   Physical Exam  Constitutional: She is oriented to person, place, and time. She appears well-developed and well-nourished.  Cardiovascular: Normal rate, regular rhythm and normal heart sounds.   Pulmonary/Chest: Effort normal and breath sounds normal.  Abdominal: Soft. Bowel sounds are normal. She exhibits no distension. There is tenderness in the suprapubic area. There is no CVA tenderness.  Neurological: She is alert and oriented to person, place, and time.  Skin: Skin is warm and dry.  Psychiatric: She has a normal mood and affect.      Results for orders placed in visit on 03/28/13  POCT URINALYSIS DIPSTICK      Result Value Range   Color, UA gold     Clarity, UA clear     Glucose, UA neg     Bilirubin, UA neg     Ketones, UA neg     Spec Grav, UA <=1.005     Blood, UA large     pH, UA 6.5     Protein, UA neg     Urobilinogen, UA  negative     Nitrite, UA neg     Leukocytes, UA small (1+)    POCT UA - MICROSCOPIC ONLY      Result Value Range   WBC, Ur, HPF, POC 10-15     RBC, urine, microscopic 10-12     Bacteria, U Microscopic many     Mucus, UA neg     Epithelial cells, urine per micros few     Crystals, Ur, HPF, POC neg     Casts, Ur, LPF, POC neg     Yeast, UA neg         Assessment & Plan:  1. Frequency  - POCT urinalysis dipstick - POCT UA - Microscopic Only  2. UTI (urinary tract infection)  - ciprofloxacin (CIPRO) 500 MG tablet; Take 1 tablet (500 mg total) by mouth 2 (two) times daily.  Dispense: 20 tablet; Refill: 0 -Increase fluid intake AZO over the counter X2 days Frequent voiding Proper perineal hygiene RTO prn Patient verbalized understanding [name], FNP-C

## 2013-03-28 NOTE — Patient Instructions (Signed)
Urinary Tract Infection  Urinary tract infections (UTIs) can develop anywhere along your urinary tract. Your urinary tract is your body's drainage system for removing wastes and extra water. Your urinary tract includes two kidneys, two ureters, a bladder, and a urethra. Your kidneys are a pair of bean-shaped organs. Each kidney is about the size of your fist. They are located below your ribs, one on each side of your spine.  CAUSES  Infections are caused by microbes, which are microscopic organisms, including fungi, viruses, and bacteria. These organisms are so small that they can only be seen through a microscope. Bacteria are the microbes that most commonly cause UTIs.  SYMPTOMS   Symptoms of UTIs may vary by age and gender of the patient and by the location of the infection. Symptoms in young women typically include a frequent and intense urge to urinate and a painful, burning feeling in the bladder or urethra during urination. Older women and men are more likely to be tired, shaky, and weak and have muscle aches and abdominal pain. A fever may mean the infection is in your kidneys. Other symptoms of a kidney infection include pain in your back or sides below the ribs, nausea, and vomiting.  DIAGNOSIS  To diagnose a UTI, your caregiver will ask you about your symptoms. Your caregiver also will ask to provide a urine sample. The urine sample will be tested for bacteria and white blood cells. White blood cells are made by your body to help fight infection.  TREATMENT   Typically, UTIs can be treated with medication. Because most UTIs are caused by a bacterial infection, they usually can be treated with the use of antibiotics. The choice of antibiotic and length of treatment depend on your symptoms and the type of bacteria causing your infection.  HOME CARE INSTRUCTIONS   If you were prescribed antibiotics, take them exactly as your caregiver instructs you. Finish the medication even if you feel better after you  have only taken some of the medication.   Drink enough water and fluids to keep your urine clear or pale yellow.   Avoid caffeine, tea, and carbonated beverages. They tend to irritate your bladder.   Empty your bladder often. Avoid holding urine for long periods of time.   Empty your bladder before and after sexual intercourse.   After a bowel movement, women should cleanse from front to back. Use each tissue only once.  SEEK MEDICAL CARE IF:    You have back pain.   You develop a fever.   Your symptoms do not begin to resolve within 3 days.  SEEK IMMEDIATE MEDICAL CARE IF:    You have severe back pain or lower abdominal pain.   You develop chills.   You have nausea or vomiting.   You have continued burning or discomfort with urination.  MAKE SURE YOU:    Understand these instructions.   Will watch your condition.   Will get help right away if you are not doing well or get worse.  Document Released: 12/30/2004 Document Revised: 09/21/2011 Document Reviewed: 04/30/2011  [employer] Patient Information 2014 [employer].

## 2013-04-05 ENCOUNTER — Other Ambulatory Visit: Payer: Self-pay | Admitting: General Practice

## 2013-04-06 ENCOUNTER — Other Ambulatory Visit: Payer: Self-pay | Admitting: *Deleted

## 2013-04-10 MED ORDER — ELETRIPTAN HYDROBROMIDE 40 MG PO TABS: 40.0000 mg | ORAL_TABLET | ORAL | Status: DC | PRN

## 2013-05-12 ENCOUNTER — Other Ambulatory Visit: Payer: Self-pay | Admitting: Nurse Practitioner

## 2013-07-15 ENCOUNTER — Other Ambulatory Visit: Payer: Self-pay | Admitting: Family Medicine

## 2013-08-10 ENCOUNTER — Other Ambulatory Visit: Payer: Self-pay | Admitting: *Deleted

## 2013-08-10 MED ORDER — SUMATRIPTAN SUCCINATE 100 MG PO TABS: 100.0000 mg | ORAL_TABLET | ORAL | Status: DC | PRN

## 2013-09-06 ENCOUNTER — Other Ambulatory Visit: Payer: Self-pay | Admitting: Nurse Practitioner

## 2013-09-11 ENCOUNTER — Other Ambulatory Visit: Payer: Self-pay | Admitting: Nurse Practitioner

## 2013-09-11 ENCOUNTER — Other Ambulatory Visit: Payer: Self-pay | Admitting: Family Medicine

## 2013-09-12 NOTE — Telephone Encounter (Signed)
Last seen 12-14. Please advise on refill

## 2013-09-28 ENCOUNTER — Other Ambulatory Visit: Payer: Self-pay | Admitting: Gynecology

## 2013-10-04 ENCOUNTER — Other Ambulatory Visit: Payer: Self-pay | Admitting: Gynecology

## 2013-10-04 DIAGNOSIS — Z1231 Encounter for screening mammogram for malignant neoplasm of breast: Secondary | ICD-10-CM

## 2013-10-12 ENCOUNTER — Other Ambulatory Visit (HOSPITAL_COMMUNITY)
Admission: RE | Admit: 2013-10-12 | Discharge: 2013-10-12 | Disposition: A | Discharge status: Home / Self Care | Payer: 59 | Source: Ambulatory Visit | Attending: Gynecology | Admitting: Gynecology

## 2013-10-12 ENCOUNTER — Ambulatory Visit (INDEPENDENT_AMBULATORY_CARE_PROVIDER_SITE_OTHER): Payer: 59 | Admitting: Gynecology

## 2013-10-12 ENCOUNTER — Encounter: Payer: Self-pay | Admitting: Gynecology

## 2013-10-12 VITALS — BP 120/70 | Ht 66.0 in | Wt 168.0 lb

## 2013-10-12 DIAGNOSIS — Z01419 Encounter for gynecological examination (general) (routine) without abnormal findings: Secondary | ICD-10-CM | POA: Insufficient documentation

## 2013-10-12 DIAGNOSIS — M949 Disorder of cartilage, unspecified: Secondary | ICD-10-CM

## 2013-10-12 DIAGNOSIS — N952 Postmenopausal atrophic vaginitis: Secondary | ICD-10-CM

## 2013-10-12 DIAGNOSIS — K644 Residual hemorrhoidal skin tags: Secondary | ICD-10-CM

## 2013-10-12 DIAGNOSIS — Z79890 Hormone replacement therapy (postmenopausal): Secondary | ICD-10-CM

## 2013-10-12 DIAGNOSIS — M8580 Other specified disorders of bone density and structure, unspecified site: Secondary | ICD-10-CM

## 2013-10-12 DIAGNOSIS — M899 Disorder of bone, unspecified: Secondary | ICD-10-CM

## 2013-10-12 DIAGNOSIS — Z1151 Encounter for screening for human papillomavirus (HPV): Secondary | ICD-10-CM | POA: Insufficient documentation

## 2013-10-12 MED ORDER — ESTRADIOL 1 MG PO TABS: 1.0000 mg | ORAL_TABLET | Freq: Every day | ORAL | Status: DC

## 2013-10-12 MED ORDER — MEDROXYPROGESTERONE ACETATE 2.5 MG PO TABS: ORAL_TABLET | ORAL | Status: DC

## 2013-10-12 NOTE — Patient Instructions (Signed)
Followup in one year, sooner as needed. Report any vaginal bleeding. Comment if you want a referral in reference to your hemorrhoids to the general surgeon. Schedule followup for your bone density.  You may obtain a copy of any labs that were done today by logging onto MyChart as outlined in the instructions provided with your AVS (after visit summary). The office will not call with normal lab results but certainly if there are any significant abnormalities then we will contact you.   Health Maintenance, Female A healthy lifestyle and preventative care can promote health and wellness.  Maintain regular health, dental, and eye exams.  Eat a healthy diet. Foods like vegetables, fruits, whole grains, low-fat dairy products, and lean protein foods contain the nutrients you need without too many calories. Decrease your intake of foods high in solid fats, added sugars, and salt. Get information about a proper diet from your caregiver, if necessary.  Regular physical exercise is one of the most important things you can do for your health. Most adults should get at least 150 minutes of moderate-intensity exercise (any activity that increases your heart rate and causes you to sweat) each week. In addition, most adults need muscle-strengthening exercises on 2 or more days a week.   Maintain a healthy weight. The body mass index (BMI) is a screening tool to identify possible weight problems. It provides an estimate of body fat based on height and weight. Your caregiver can help determine your BMI, and can help you achieve or maintain a healthy weight. For adults 20 years and older:  A BMI below 18.5 is considered underweight.  A BMI of 18.5 to 24.9 is normal.  A BMI of 25 to 29.9 is considered overweight.  A BMI of 30 and above is considered obese.  Maintain normal blood lipids and cholesterol by exercising and minimizing your intake of saturated fat. Eat a balanced diet with plenty of fruits and  vegetables. Blood tests for lipids and cholesterol should begin at age 43 and be repeated every 5 years. If your lipid or cholesterol levels are high, you are over 50, or you are a high risk for heart disease, you may need your cholesterol levels checked more frequently.Ongoing high lipid and cholesterol levels should be treated with medicines if diet and exercise are not effective.  If you smoke, find out from your caregiver how to quit. If you do not use tobacco, do not start.  Lung cancer screening is recommended for adults aged 46 80 years who are at high risk for developing lung cancer because of a history of smoking. Yearly low-dose computed tomography (CT) is recommended for people who have at least a 30-pack-year history of smoking and are a current smoker or have quit within the past 15 years. A pack year of smoking is smoking an average of 1 pack of cigarettes a day for 1 year (for example: 1 pack a day for 30 years or 2 packs a day for 15 years). Yearly screening should continue until the smoker has stopped smoking for at least 15 years. Yearly screening should also be stopped for people who develop a health problem that would prevent them from having lung cancer treatment.  If you are pregnant, do not drink alcohol. If you are breastfeeding, be very cautious about drinking alcohol. If you are not pregnant and choose to drink alcohol, do not exceed 1 drink per day. One drink is considered to be 12 ounces (355 mL) of beer, 5 ounces (148  mL) of wine, or 1.5 ounces (44 mL) of liquor.  Avoid use of street drugs. Do not share needles with anyone. Ask for help if you need support or instructions about stopping the use of drugs.  High blood pressure causes heart disease and increases the risk of stroke. Blood pressure should be checked at least every 1 to 2 years. Ongoing high blood pressure should be treated with medicines, if weight loss and exercise are not effective.  If you are 62 to 57 years  old, ask your caregiver if you should take aspirin to prevent strokes.  Diabetes screening involves taking a blood sample to check your fasting blood sugar level. This should be done once every 3 years, after age 71, if you are within normal weight and without risk factors for diabetes. Testing should be considered at a younger age or be carried out more frequently if you are overweight and have at least 1 risk factor for diabetes.  Breast cancer screening is essential preventative care for women. You should practice "breast self-awareness." This means understanding the normal appearance and feel of your breasts and may include breast self-examination. Any changes detected, no matter how small, should be reported to a caregiver. Women in their 57s and 30s should have a clinical breast exam (CBE) by a caregiver as part of a regular health exam every 1 to 3 years. After age 22, women should have a CBE every year. Starting at age 26, women should consider having a mammogram (breast X-ray) every year. Women who have a family history of breast cancer should talk to their caregiver about genetic screening. Women at a high risk of breast cancer should talk to their caregiver about having an MRI and a mammogram every year.  Breast cancer gene (BRCA)-related cancer risk assessment is recommended for women who have family members with BRCA-related cancers. BRCA-related cancers include breast, ovarian, tubal, and peritoneal cancers. Having family members with these cancers may be associated with an increased risk for harmful changes (mutations) in the breast cancer genes BRCA1 and BRCA2. Results of the assessment will determine the need for genetic counseling and BRCA1 and BRCA2 testing.  The Pap test is a screening test for cervical cancer. Women should have a Pap test starting at age 84. Between ages 63 and 20, Pap tests should be repeated every 2 years. Beginning at age 54, you should have a Pap test every 3 years  as long as the past 3 Pap tests have been normal. If you had a hysterectomy for a problem that was not cancer or a condition that could lead to cancer, then you no longer need Pap tests. If you are between ages 74 and 16, and you have had normal Pap tests going back 10 years, you no longer need Pap tests. If you have had past treatment for cervical cancer or a condition that could lead to cancer, you need Pap tests and screening for cancer for at least 20 years after your treatment. If Pap tests have been discontinued, risk factors (such as a new sexual partner) need to be reassessed to determine if screening should be resumed. Some women have medical problems that increase the chance of getting cervical cancer. In these cases, your caregiver may recommend more frequent screening and Pap tests.  The human papillomavirus (HPV) test is an additional test that may be used for cervical cancer screening. The HPV test looks for the virus that can cause the cell changes on the cervix. The cells  collected during the Pap test can be tested for HPV. The HPV test could be used to screen women aged 47 years and older, and should be used in women of any age who have unclear Pap test results. After the age of 11, women should have HPV testing at the same frequency as a Pap test.  Colorectal cancer can be detected and often prevented. Most routine colorectal cancer screening begins at the age of 81 and continues through age 71. However, your caregiver may recommend screening at an earlier age if you have risk factors for colon cancer. On a yearly basis, your caregiver may provide home test kits to check for hidden blood in the stool. Use of a small camera at the end of a tube, to directly examine the colon (sigmoidoscopy or colonoscopy), can detect the earliest forms of colorectal cancer. Talk to your caregiver about this at age 18, when routine screening begins. Direct examination of the colon should be repeated every 5 to 10  years through age 72, unless early forms of pre-cancerous polyps or small growths are found.  Hepatitis C blood testing is recommended for all people born from [birth year] through 1964 and any individual with known risks for hepatitis C.  Practice safe sex. Use condoms and avoid high-risk sexual practices to reduce the spread of sexually transmitted infections (STIs). Sexually active women aged 15 and younger should be checked for Chlamydia, which is a common sexually transmitted infection. Older women with new or multiple partners should also be tested for Chlamydia. Testing for other STIs is recommended if you are sexually active and at increased risk.  Osteoporosis is a disease in which the bones lose minerals and strength with aging. This can result in serious bone fractures. The risk of osteoporosis can be identified using a bone density scan. Women ages 51 and over and women at risk for fractures or osteoporosis should discuss screening with their caregivers. Ask your caregiver whether you should be taking a calcium supplement or vitamin D to reduce the rate of osteoporosis.  Menopause can be associated with physical symptoms and risks. Hormone replacement therapy is available to decrease symptoms and risks. You should talk to your caregiver about whether hormone replacement therapy is right for you.  Use sunscreen. Apply sunscreen liberally and repeatedly throughout the day. You should seek shade when your shadow is shorter than you. Protect yourself by wearing long sleeves, pants, a wide-brimmed hat, and sunglasses year round, whenever you are outdoors.  Notify your caregiver of new moles or changes in moles, especially if there is a change in shape or color. Also notify your caregiver if a mole is larger than the size of a pencil eraser.  Stay current with your immunizations. Document Released: 10/05/2010 Document Revised: 07/17/2012 Document Reviewed: 10/05/2010 [facility] Patient Information 2014  [place].

## 2013-10-12 NOTE — Progress Notes (Signed)
Carolyn Cooper 03-Aug-1955 [id number]        58 y.o.  G4P4 for annual exam.  Several issues noted below.  Past medical history,surgical history, problem list, medications, allergies, family history and social history were all reviewed and documented as reviewed in the EPIC chart.  ROS:  12 system ROS performed with pertinent positives and negatives included in the history, assessment and plan.   Additional significant findings :  None   Exam: Carolyn Cooper Vitals:   10/12/13 1523  BP: 120/70  Height: 5\' 6"  (1.676 m)  Weight: 168 lb (76.204 kg)   General appearance:  Normal affect, orientation and appearance. Skin: Grossly normal HEENT: Without gross lesions.  No cervical or supraclavicular adenopathy. Thyroid normal.  Lungs:  Clear without wheezing, rales or rhonchi Cardiac: RR, without RMG Abdominal:  Soft, nontender, without masses, guarding, rebound, organomegaly or hernia Breasts:  Examined lying and sitting without masses, retractions, discharge or axillary adenopathy. Pelvic:  Ext/BUS/vagina with generalized atrophic changes  Cervix with atrophic changes. Pap/HPV  Uterus anteverted, normal size, shape and contour, midline and mobile nontender   Adnexa  Without masses or tenderness    Anus and perineum  with significant old hemorrhoids 360.  Rectovaginal  Normal sphincter tone without palpated masses or tenderness.    Assessment/Plan:  58 y.o. G35P4 female for annual exam.   1. Postmenopausal/HRT. Patient continues on Estrace 1 mg and Provera 2.5 mg daily. She is doing well with this and wants to continue. She has done no vaginal bleeding. We've discussed the issues of transdermal and whether this is safer from a thrombosis standpoint as well as whether naturalized progesterone is "safer" then Provera. Patient does not desire to change her regimen and understands the risks. I again reviewed the WHI study with increased risk of stroke heart attack DVT and breast cancer. The  ACOG and NAMS statements her lowest dose for shortness period time. At this point patient wants to continue and I refilled her time to year. She is going to try to wean this coming fall with Estrace 0.5 mg and Prometrium 2.5 and then hopefully wean all the way off. Patient knows to report any vaginal bleeding. 2. Hemorrhoids. Patient does have bouts of bleeding with her hemorrhoids which are impressive on exam. They do not cause her any pain. Options for referral to general surgeon to discuss treatment options reviewed but declined. Patient prefers observation right now. 3. Osteopenia. DEXA 2012 with T score -1.3. Did not followup for DEXA last year as we discussed. I put a new order in and she is going to arrange this through her work. Increased calcium vitamin D reviewed. 4. Mammography scheduled and she'll followup for this. SBE monthly reviewed. 5. Colonoscopy 2010. Repeat at their recommended interval. 6. Pap smear 2012. Pap/HPV done today. Does have history of cervical cone biopsy 1996. Negative Pap smear since. Assuming negative then plan repeat at 3-5 year interval. 7. Health maintenance. No routine lab work done as she has this done through her primary physician's office. Followup in one year, sooner as needed.   Note: This document was prepared with digital dictation and possible smart phrase technology. Any transcriptional errors that result from this process are unintentional.   Anastasio Auerbach MD, 3:52 PM 10/12/2013

## 2013-10-12 NOTE — Addendum Note (Signed)
Addended by: Nelva Nay on: 10/12/2013 04:05 PM   Modules accepted: Orders

## 2013-10-13 LAB — URINALYSIS W MICROSCOPIC + REFLEX CULTURE
Bacteria, UA: NONE SEEN
Bilirubin Urine: NEGATIVE
CASTS: NONE SEEN
Crystals: NONE SEEN
Glucose, UA: NEGATIVE mg/dL
Ketones, ur: NEGATIVE mg/dL
LEUKOCYTES UA: NEGATIVE
Nitrite: NEGATIVE
PH: 6.5 (ref 5.0–8.0)
PROTEIN: NEGATIVE mg/dL
Specific Gravity, Urine: <1.005 — ABNORMAL LOW (ref 1.005–1.030)
Urobilinogen, UA: 0.2 mg/dL (ref 0.0–1.0)

## 2013-10-16 LAB — CYTOLOGY - PAP

## 2013-10-26 ENCOUNTER — Ambulatory Visit (HOSPITAL_COMMUNITY)
Admission: RE | Admit: 2013-10-26 | Discharge: 2013-10-26 | Disposition: A | Discharge status: Home / Self Care | Payer: 59 | Source: Ambulatory Visit | Attending: Gynecology | Admitting: Gynecology

## 2013-10-26 DIAGNOSIS — Z1231 Encounter for screening mammogram for malignant neoplasm of breast: Secondary | ICD-10-CM

## 2013-10-31 ENCOUNTER — Encounter: Payer: Self-pay | Admitting: Family Medicine

## 2013-10-31 ENCOUNTER — Ambulatory Visit (INDEPENDENT_AMBULATORY_CARE_PROVIDER_SITE_OTHER): Payer: 59 | Admitting: Family Medicine

## 2013-10-31 VITALS — BP 116/61 | HR 72 | Temp 98.5°F | Ht 66.0 in | Wt 168.0 lb

## 2013-10-31 DIAGNOSIS — G43019 Migraine without aura, intractable, without status migrainosus: Secondary | ICD-10-CM

## 2013-11-01 LAB — CMP14+EGFR
ALT: 8 IU/L (ref 0–32)
AST: 13 IU/L (ref 0–40)
Albumin/Globulin Ratio: 1.7 (ref 1.1–2.5)
Albumin: 4.1 g/dL (ref 3.5–5.5)
Alkaline Phosphatase: 92 IU/L (ref 39–117)
BUN/Creatinine Ratio: 13 (ref 9–23)
BUN: 11 mg/dL (ref 6–24)
CALCIUM: 9.5 mg/dL (ref 8.7–10.2)
CO2: 27 mmol/L (ref 18–29)
Chloride: 100 mmol/L (ref 97–108)
Creatinine, Ser: 0.85 mg/dL (ref 0.57–1.00)
GFR calc Af Amer: 88 mL/min/{1.73_m2} (ref >59)
GFR calc non Af Amer: 76 mL/min/{1.73_m2} (ref >59)
Globulin, Total: 2.4 g/dL (ref 1.5–4.5)
Glucose: 70 mg/dL (ref 65–99)
POTASSIUM: 4 mmol/L (ref 3.5–5.2)
SODIUM: 141 mmol/L (ref 134–144)
Total Bilirubin: 0.4 mg/dL (ref 0.0–1.2)
Total Protein: 6.5 g/dL (ref 6.0–8.5)

## 2013-11-01 LAB — CBC WITH DIFFERENTIAL
BASOS ABS: 0 10*3/uL (ref 0.0–0.2)
Basos: 0 %
EOS ABS: 0.1 10*3/uL (ref 0.0–0.4)
Eos: 1 %
HCT: 39.8 % (ref 34.0–46.6)
Hemoglobin: 13.3 g/dL (ref 11.1–15.9)
IMMATURE GRANS (ABS): 0 10*3/uL (ref 0.0–0.1)
Immature Granulocytes: 0 %
Lymphocytes Absolute: 1.4 10*3/uL (ref 0.7–3.1)
Lymphs: 22 %
MCH: 32.4 pg (ref 26.6–33.0)
MCHC: 33.4 g/dL (ref 31.5–35.7)
MCV: 97 fL (ref 79–97)
Monocytes Absolute: 0.5 10*3/uL (ref 0.1–0.9)
Monocytes: 8 %
NEUTROS PCT: 69 %
Neutrophils Absolute: 4.5 10*3/uL (ref 1.4–7.0)
PLATELETS: 256 10*3/uL (ref 150–379)
RBC: 4.1 x10E6/uL (ref 3.77–5.28)
RDW: 13 % (ref 12.3–15.4)
WBC: 6.6 10*3/uL (ref 3.4–10.8)

## 2013-11-01 NOTE — Progress Notes (Signed)
   Subjective:    Patient ID: Carolyn Cooper, female    DOB: [birth year], 58 y.o.   MRN: [id number]  HPI Here for general exam  Sees GYN and on HRT and neurology for migraines.  Now on triptans and Depakote and needs lab for the latter    Review of Systems  Constitutional: Negative.   Respiratory: Negative.   Cardiovascular: Negative.   Neurological: Positive for headaches (HA's 3-4 times/week).  All other systems reviewed and are negative.      Objective:   Physical Exam  Constitutional: She is oriented to person, place, and time. She appears well-developed and well-nourished.  Eyes: Conjunctivae and EOM are normal.  Neck: Normal range of motion. Neck supple.  Cardiovascular: Normal rate, regular rhythm and normal heart sounds.   Pulmonary/Chest: Effort normal and breath sounds normal.  Abdominal: Soft. Bowel sounds are normal.  Musculoskeletal: Normal range of motion.  Neurological: She is alert and oriented to person, place, and time. She has normal reflexes.  Skin: Skin is warm and dry.  Psychiatric: She has a normal mood and affect. Her behavior is normal. Thought content normal.          Assessment & Plan:  The encounter diagnosis was Migraine without aura, with intractable migraine, so stated, without mention of status migrainosus.Will check labs for neurology office

## 2013-11-05 ENCOUNTER — Encounter: Payer: Self-pay | Admitting: Family Medicine

## 2014-01-21 ENCOUNTER — Other Ambulatory Visit: Payer: Self-pay | Admitting: *Deleted

## 2014-01-21 MED ORDER — SUMATRIPTAN SUCCINATE 100 MG PO TABS: ORAL_TABLET | ORAL | Status: DC

## 2014-01-26 ENCOUNTER — Other Ambulatory Visit: Payer: Self-pay | Admitting: Nurse Practitioner

## 2014-01-30 ENCOUNTER — Ambulatory Visit (INDEPENDENT_AMBULATORY_CARE_PROVIDER_SITE_OTHER): Payer: 59 | Admitting: Pharmacist

## 2014-01-30 ENCOUNTER — Encounter: Payer: Self-pay | Admitting: Pharmacist

## 2014-01-30 ENCOUNTER — Ambulatory Visit (INDEPENDENT_AMBULATORY_CARE_PROVIDER_SITE_OTHER): Payer: 59

## 2014-01-30 VITALS — Ht 66.0 in | Wt 168.0 lb

## 2014-01-30 DIAGNOSIS — M8580 Other specified disorders of bone density and structure, unspecified site: Secondary | ICD-10-CM

## 2014-01-30 DIAGNOSIS — G43909 Migraine, unspecified, not intractable, without status migrainosus: Secondary | ICD-10-CM | POA: Insufficient documentation

## 2014-01-30 NOTE — Progress Notes (Signed)
Patient ID: Carolyn Cooper, female   DOB: [birth year], 58 y.o.   MRN: [id number] [facility] Current Height: Height: 5\' 6"  (167.6 cm)       Current Weight: Weight: 168 lb (76.204 kg)       Ethnicity:Caucasian    HPI: Does pt already have a diagnosis of:  Osteopenia?  Yes Osteoporosis?  No  Back Pain?  No       Kyphosis?  No Prior fracture?  No Med(s) for Osteoporosis/Osteopenia:  Calcium 600mg  QD, estradiol 1mg  daily Med(s) previously tried for Osteoporosis/Osteopenia:  Fosamax 35mg                                                               PMH: Age at menopause:  unsure Hysterectomy?  No Oophorectomy?  No HRT? Yes - Current.  Type/duration: estradiol and medroxyprogesterone - patient and her GYN has discussed plan to taper and discontinue hormone therapy over the next few months.   Steroid Use?  No Thyroid med?  No History of cancer?  No History of digestive disorders (ie Crohn's)?  Yes - IBS Last Vitamin D Result:  40 (10/11/2012) Last GFR Result:  76 (10/31/2013)   FH/SH: Family history of osteoporosis?  No Parent with history of hip fracture?  No Family history of breast cancer?  Yes - maternal aunt Exercise?  No Smoking?  No Alcohol?  Yes - occasional - not daily    Calcium Assessment Calcium Intake  # of servings/day  Calcium mg  Milk (8 oz) 0  x  300  = 0  Yogurt (4 oz) 0 x  200 = 0  Cheese (1 oz) 1 x  200 = 200mg   Other Calcium sources   250mg   Ca supplement Calcium 600mg  qd = 600mg    Estimated calcium intake per day 1050mg     DEXA Results Date of Test T-Score for AP Spine L1-L4 T-Score for Total Left Hip T-Score for Total Right Hip  01/30/2014 1.1 -1.0 -0.9  09/09/2010 0.6 -1.3 -1.1  01/31/2008 1.1 -1.0 -1.1  05/19/2004 0.9 -1.2 --   FRAX 10 year estimate: Total FX risk:  7.2%  (consider medication if >/= 20%) Hip FX risk:  0.6%  (consider medication if >/= 3%)  Assessment: Osteopenia with improved BMD - low calcium  intake  Recommendations: 1.  Discussed BMD results and fracture risk 2.  recommend calcium 1200mg  daily through supplementation or diet.  3.  recommend weight bearing exercise - 30 minutes at least 4 days per week.   4.  Counseled and educated about fall risk and prevention.  Recheck DEXA:  2 years - especially important since plan to discontinue estradiol over the next few months.   Time spent counseling patient:  15 minutes  Cherre Robins, PharmD, CPP

## 2014-02-04 ENCOUNTER — Encounter: Payer: Self-pay | Admitting: Pharmacist

## 2014-03-10 ENCOUNTER — Other Ambulatory Visit: Payer: Self-pay | Admitting: Family Medicine

## 2014-03-25 ENCOUNTER — Other Ambulatory Visit: Payer: Self-pay | Admitting: *Deleted

## 2014-03-25 MED ORDER — RIZATRIPTAN BENZOATE 10 MG PO TABS: 10.0000 mg | ORAL_TABLET | ORAL | Status: DC | PRN

## 2014-03-25 NOTE — Telephone Encounter (Signed)
Please review and advise.

## 2014-04-02 ENCOUNTER — Other Ambulatory Visit: Payer: Self-pay | Admitting: *Deleted

## 2014-04-02 MED ORDER — ELETRIPTAN HYDROBROMIDE 40 MG PO TABS: 40.0000 mg | ORAL_TABLET | ORAL | Status: DC | PRN

## 2014-04-15 ENCOUNTER — Other Ambulatory Visit: Payer: Self-pay | Admitting: *Deleted

## 2014-04-16 MED ORDER — SUMATRIPTAN SUCCINATE 100 MG PO TABS: ORAL_TABLET | ORAL | Status: DC

## 2014-05-27 ENCOUNTER — Other Ambulatory Visit: Payer: Self-pay | Admitting: *Deleted

## 2014-05-27 MED ORDER — NARATRIPTAN HCL 2.5 MG PO TABS: ORAL_TABLET | ORAL | Status: DC

## 2014-05-30 ENCOUNTER — Other Ambulatory Visit: Payer: Self-pay | Admitting: *Deleted

## 2014-05-30 MED ORDER — OSELTAMIVIR PHOSPHATE 75 MG PO CAPS: 75.0000 mg | ORAL_CAPSULE | Freq: Two times a day (BID) | ORAL | Status: DC

## 2014-06-11 ENCOUNTER — Other Ambulatory Visit: Payer: Self-pay | Admitting: *Deleted

## 2014-06-11 MED ORDER — ELETRIPTAN HYDROBROMIDE 40 MG PO TABS: 40.0000 mg | ORAL_TABLET | ORAL | Status: DC | PRN

## 2014-06-17 ENCOUNTER — Ambulatory Visit: Payer: Self-pay | Admitting: Family Medicine

## 2014-06-19 ENCOUNTER — Other Ambulatory Visit: Payer: Self-pay | Admitting: *Deleted

## 2014-06-19 MED ORDER — SUMATRIPTAN SUCCINATE 100 MG PO TABS: ORAL_TABLET | ORAL | Status: DC

## 2014-06-19 NOTE — Telephone Encounter (Signed)
Patient has an appt with you at the end of the month. Please advise on refill

## 2014-06-25 ENCOUNTER — Ambulatory Visit (INDEPENDENT_AMBULATORY_CARE_PROVIDER_SITE_OTHER): Payer: 59 | Admitting: Family Medicine

## 2014-06-25 ENCOUNTER — Encounter: Payer: Self-pay | Admitting: Family Medicine

## 2014-06-25 VITALS — BP 129/79 | HR 88 | Temp 97.4°F | Ht 66.0 in | Wt 176.4 lb

## 2014-06-25 DIAGNOSIS — R5383 Other fatigue: Secondary | ICD-10-CM | POA: Diagnosis not present

## 2014-06-25 DIAGNOSIS — R51 Headache: Secondary | ICD-10-CM

## 2014-06-25 DIAGNOSIS — E785 Hyperlipidemia, unspecified: Secondary | ICD-10-CM | POA: Diagnosis not present

## 2014-06-25 DIAGNOSIS — E559 Vitamin D deficiency, unspecified: Secondary | ICD-10-CM

## 2014-06-25 DIAGNOSIS — Z139 Encounter for screening, unspecified: Secondary | ICD-10-CM

## 2014-06-25 DIAGNOSIS — G4700 Insomnia, unspecified: Secondary | ICD-10-CM

## 2014-06-25 DIAGNOSIS — R519 Headache, unspecified: Secondary | ICD-10-CM

## 2014-06-25 LAB — POCT CBC
Granulocyte percent: 72 %G (ref 37–80)
HEMATOCRIT: 40.6 % (ref 37.7–47.9)
Hemoglobin: 12.8 g/dL (ref 12.2–16.2)
Lymph, poc: 1.9 (ref 0.6–3.4)
MCH: 30.5 pg (ref 27–31.2)
MCHC: 31.4 g/dL — AB (ref 31.8–35.4)
MCV: 97 fL (ref 80–97)
MPV: 8.3 fL (ref 0–99.8)
POC Granulocyte: 6.1 (ref 2–6.9)
POC LYMPH %: 22.3 % (ref 10–50)
Platelet Count, POC: 262 10*3/uL (ref 142–424)
RBC: 4.19 M/uL (ref 4.04–5.48)
RDW, POC: 12.4 %
WBC: 8.5 10*3/uL (ref 4.6–10.2)

## 2014-06-25 MED ORDER — ESCITALOPRAM OXALATE 20 MG PO TABS: 20.0000 mg | ORAL_TABLET | Freq: Every day | ORAL | Status: DC

## 2014-06-25 MED ORDER — NORTRIPTYLINE HCL 10 MG PO CAPS: 10.0000 mg | ORAL_CAPSULE | Freq: Every day | ORAL | Status: DC

## 2014-06-25 MED ORDER — FLURBIPROFEN 100 MG PO TABS
100.0000 mg | ORAL_TABLET | Freq: Every day | ORAL | Status: DC
Start: 2014-06-25 — End: 2014-08-21

## 2014-06-25 MED ORDER — TERBINAFINE HCL 250 MG PO TABS: 250.0000 mg | ORAL_TABLET | Freq: Every day | ORAL | Status: DC

## 2014-06-25 MED ORDER — TOPIRAMATE 25 MG PO TABS: 25.0000 mg | ORAL_TABLET | Freq: Two times a day (BID) | ORAL | Status: DC

## 2014-06-25 NOTE — Progress Notes (Signed)
Subjective:  Patient ID: Carolyn Cooper, female    DOB: 1955/08/01  Age: 59 y.o. MRN: [id number]  CC: migraines and Nail Problem   HPI Carolyn Cooper presents for Migraines since age 44.HAs had multiple failures of preventive including depakote, topamax, calan, amlodipine, imiprmine. Has seen nDr. Earley Favor at [facility] clinic.Botox didn't work. No relief with caffeine avoidance. Stopping hormones two mos didn't help.Rare aura of left side vision hemianopsia.  History Carolyn Cooper has a past medical history of IBS (irritable bowel syndrome); Migraines; and Osteopenia (2012).   She has past surgical history that includes Mouth surgery (1978); Cervical cone biopsy (1996); and Hernia repair.   Her family history includes Cancer in her maternal aunt; Diverticulitis in her mother; Heart disease in her father; Ovarian cancer in her maternal grandmother.She reports that she has never smoked. She has never used smokeless tobacco. She reports that she drinks alcohol. She reports that she does not use illicit drugs.  Current Outpatient Prescriptions on File Prior to Visit  Medication Sig Dispense Refill  . calcium carbonate (OS-CAL) 600 MG TABS Take 600 mg by mouth 2 (two) times daily with a meal.    . Cholecalciferol (VITAMIN D) 400 UNITS capsule Take 800 Units by mouth daily.      Marland Kitchen eletriptan (RELPAX) 40 MG tablet Take 1 tablet (40 mg total) by mouth as needed for migraine or headache. May repeat x1 in 24hours 10 tablet 0  . naratriptan (AMERGE) 2.5 MG tablet USE AS DIRECTED 9 tablet 2  . rizatriptan (MAXALT) 10 MG tablet Take 1 tablet (10 mg total) by mouth as needed for migraine. May repeat in 2 hours if needed 9 tablet 0  . SUMAtriptan (IMITREX) 100 MG tablet TAKE 1 TAB BY MOUTH EVERY 2 HOURS AS NEEDED MAY REPEAT IN 2 HOURS IF HEADACHE PERSISTS 10 tablet 1   No current facility-administered medications on file prior to visit.    ROS Review of Systems  Constitutional: Negative for fever, chills, diaphoresis,  appetite change, fatigue and unexpected weight change.  HENT: Negative for congestion, ear pain, hearing loss, postnasal drip, rhinorrhea, sneezing, sore throat and trouble swallowing.   Eyes: Negative for pain.  Respiratory: Negative for cough, chest tightness and shortness of breath.   Cardiovascular: Negative for chest pain and palpitations.  Gastrointestinal: Negative for nausea, vomiting, abdominal pain, diarrhea and constipation.  Genitourinary: Negative for dysuria, frequency and menstrual problem.  Musculoskeletal: Negative for joint swelling and arthralgias.  Skin: Negative for rash.  Neurological: Negative for dizziness, weakness, numbness and headaches.  Psychiatric/Behavioral: Negative for dysphoric mood and agitation.    Objective:  BP 129/79 mmHg  Pulse 88  Temp(Src) 97.4 F (36.3 C) (Oral)  Ht '5\' 6"'  (1.676 m)  Wt 176 lb 6.4 oz (80.015 kg)  BMI 28.49 kg/m2  BP Readings from Last 3 Encounters:  06/25/14 129/79  10/31/13 116/61  10/12/13 120/70    Wt Readings from Last 3 Encounters:  06/25/14 176 lb 6.4 oz (80.015 kg)  01/30/14 168 lb (76.204 kg)  10/31/13 168 lb (76.204 kg)     Physical Exam  Constitutional: She is oriented to person, place, and time. She appears well-developed and well-nourished. No distress.  HENT:  Head: Normocephalic and atraumatic.  Right Ear: External ear normal.  Left Ear: External ear normal.  Nose: Nose normal.  Mouth/Throat: Oropharynx is clear and moist.  Eyes: Conjunctivae and EOM are normal. Pupils are equal, round, and reactive to light.  Neck: Normal range of motion. Neck  supple. No thyromegaly present.  Cardiovascular: Normal rate, regular rhythm and normal heart sounds.   No murmur heard. Pulmonary/Chest: Effort normal and breath sounds normal. No respiratory distress. She has no wheezes. She has no rales.  Abdominal: Soft. Bowel sounds are normal. She exhibits no distension. There is no tenderness.  Lymphadenopathy:     She has no cervical adenopathy.  Neurological: She is alert and oriented to person, place, and time. She has normal reflexes.  Skin: Skin is warm and dry.  Psychiatric: She has a normal mood and affect. Her behavior is normal. Judgment and thought content normal.    No results found for: HGBA1C  Lab Results  Component Value Date   WBC 8.5 06/25/2014   HGB 12.8 06/25/2014   HCT 40.6 06/25/2014   PLT 256 10/31/2013   GLUCOSE 88 06/25/2014   CHOL 193 06/25/2014   TRIG 98 06/25/2014   HDL 83 06/25/2014   LDLCALC 90 06/25/2014   ALT 19 06/25/2014   AST 16 06/25/2014   NA 142 06/25/2014   K 4.0 06/25/2014   CL 101 06/25/2014   CREATININE 0.84 06/25/2014   BUN 17 06/25/2014   CO2 26 06/25/2014   TSH 2.330 06/25/2014    Mm Screening Breast Tomo Bilateral  10/26/2013   CLINICAL DATA:  Screening.  EXAM: DIGITAL SCREENING BILATERAL MAMMOGRAM WITH 3D TOMO WITH CAD  COMPARISON:  Previous exam(s).  ACR Breast Density Category c: The breast tissue is heterogeneously dense, which may obscure small masses.  FINDINGS: There are no findings suspicious for malignancy. Images were processed with CAD.  IMPRESSION: No mammographic evidence of malignancy. A result letter of this screening mammogram will be mailed directly to the patient.  RECOMMENDATION: Screening mammogram in one year. (Code:SM-B-01Y)  BI-RADS CATEGORY  1: Negative.   Electronically Signed   By: Curlene Dolphin M.D.   On: 10/26/2013 13:01    Assessment & Plan:   Carolyn Cooper was seen today for migraines and nail problem.  Diagnoses and all orders for this visit:  Insomnia Orders: -     POCT CBC -     Thyroid Panel With TSH; Future -     Thyroid Panel With TSH  Headache disorder Orders: -     Thyroid Panel With TSH; Future -     Thyroid Panel With TSH  Other fatigue Orders: -     POCT CBC -     CMP14+EGFR; Future -     Thyroid Panel With TSH; Future -     Vit D  25 hydroxy (rtn osteoporosis monitoring); Future -      CMP14+EGFR -     Thyroid Panel With TSH -     Vit D  25 hydroxy (rtn osteoporosis monitoring) -     Lipid panel  Screening Orders: -     NMR, lipoprofile; Future -     Cancel: NMR, lipoprofile -     Lipid panel  Other orders -     nortriptyline (PAMELOR) 10 MG capsule; Take 1 capsule (10 mg total) by mouth at bedtime. -     topiramate (TOPAMAX) 25 MG tablet; Take 1 tablet (25 mg total) by mouth 2 (two) times daily. -     flurbiprofen (ANSAID) 100 MG tablet; Take 1 tablet (100 mg total) by mouth daily. -     escitalopram (LEXAPRO) 20 MG tablet; Take 1 tablet (20 mg total) by mouth daily. -     terbinafine (LAMISIL) 250 MG tablet; Take 1 tablet (  250 mg total) by mouth daily. -     Lipid panel -     Vitamin D, Ergocalciferol, (DRISDOL) 50000 UNITS CAPS capsule; Take 1 capsule (50,000 Units total) by mouth 2 (two) times a week.   I have discontinued Ms. Uher's divalproex, estradiol, medroxyPROGESTERone, and oseltamivir. I have also changed her flurbiprofen and escitalopram. Additionally, I am having her start on nortriptyline, topiramate, terbinafine, and Vitamin D (Ergocalciferol). Lastly, I am having her maintain her Vitamin D, calcium carbonate, rizatriptan, naratriptan, eletriptan, and SUMAtriptan.  Meds ordered this encounter  Medications  . nortriptyline (PAMELOR) 10 MG capsule    Sig: Take 1 capsule (10 mg total) by mouth at bedtime.    Dispense:  30 capsule    Refill:  2  . topiramate (TOPAMAX) 25 MG tablet    Sig: Take 1 tablet (25 mg total) by mouth 2 (two) times daily.    Dispense:  30 tablet    Refill:  2  . flurbiprofen (ANSAID) 100 MG tablet    Sig: Take 1 tablet (100 mg total) by mouth daily.    Dispense:  45 tablet    Refill:  1  . escitalopram (LEXAPRO) 20 MG tablet    Sig: Take 1 tablet (20 mg total) by mouth daily.    Dispense:  90 tablet    Refill:  3  . terbinafine (LAMISIL) 250 MG tablet    Sig: Take 1 tablet (250 mg total) by mouth daily.    Dispense:   30 tablet    Refill:  2  . Vitamin D, Ergocalciferol, (DRISDOL) 50000 UNITS CAPS capsule    Sig: Take 1 capsule (50,000 Units total) by mouth 2 (two) times a week.    Dispense:  30 capsule    Refill:  0     Follow-up: Return in about 1 month (around 07/26/2014).  Claretta Fraise, M.D.

## 2014-06-26 ENCOUNTER — Encounter: Payer: Self-pay | Admitting: Family Medicine

## 2014-06-26 LAB — CMP14+EGFR
A/G RATIO: 1.6 (ref 1.1–2.5)
ALT: 19 IU/L (ref 0–32)
AST: 16 IU/L (ref 0–40)
Albumin: 4.4 g/dL (ref 3.5–5.5)
Alkaline Phosphatase: 90 IU/L (ref 39–117)
BUN/Creatinine Ratio: 20 (ref 9–23)
BUN: 17 mg/dL (ref 6–24)
Bilirubin Total: 0.3 mg/dL (ref 0.0–1.2)
CALCIUM: 9.6 mg/dL (ref 8.7–10.2)
CO2: 26 mmol/L (ref 18–29)
Chloride: 101 mmol/L (ref 97–108)
Creatinine, Ser: 0.84 mg/dL (ref 0.57–1.00)
GFR calc Af Amer: 89 mL/min/{1.73_m2} (ref >59)
GFR calc non Af Amer: 77 mL/min/{1.73_m2} (ref >59)
Globulin, Total: 2.7 g/dL (ref 1.5–4.5)
Glucose: 88 mg/dL (ref 65–99)
Potassium: 4 mmol/L (ref 3.5–5.2)
SODIUM: 142 mmol/L (ref 134–144)
Total Protein: 7.1 g/dL (ref 6.0–8.5)

## 2014-06-26 LAB — THYROID PANEL WITH TSH
Free Thyroxine Index: 1.3 (ref 1.2–4.9)
T3 Uptake Ratio: 25 % (ref 24–39)
T4 TOTAL: 5.3 ug/dL (ref 4.5–12.0)
TSH: 2.33 u[IU]/mL (ref 0.450–4.500)

## 2014-06-26 LAB — VITAMIN D 25 HYDROXY (VIT D DEFICIENCY, FRACTURES): Vit D, 25-Hydroxy: 24.8 ng/mL — ABNORMAL LOW (ref 30.0–100.0)

## 2014-06-26 LAB — LIPID PANEL
CHOLESTEROL TOTAL: 193 mg/dL (ref 100–199)
Chol/HDL Ratio: 2.3 ratio units (ref 0.0–4.4)
HDL: 83 mg/dL (ref >39)
LDL Calculated: 90 mg/dL (ref 0–99)
TRIGLYCERIDES: 98 mg/dL (ref 0–149)
VLDL Cholesterol Cal: 20 mg/dL (ref 5–40)

## 2014-06-26 MED ORDER — VITAMIN D (ERGOCALCIFEROL) 1.25 MG (50000 UNIT) PO CAPS: 50000.0000 [IU] | ORAL_CAPSULE | ORAL | Status: DC

## 2014-07-05 ENCOUNTER — Other Ambulatory Visit: Payer: Self-pay | Admitting: *Deleted

## 2014-07-05 MED ORDER — RIZATRIPTAN BENZOATE 10 MG PO TABS: 10.0000 mg | ORAL_TABLET | ORAL | Status: DC | PRN

## 2014-07-15 ENCOUNTER — Other Ambulatory Visit: Payer: Self-pay | Admitting: *Deleted

## 2014-07-15 DIAGNOSIS — R51 Headache: Principal | ICD-10-CM

## 2014-07-15 DIAGNOSIS — R519 Headache, unspecified: Secondary | ICD-10-CM

## 2014-07-15 MED ORDER — TOPIRAMATE 25 MG PO TABS: 25.0000 mg | ORAL_TABLET | Freq: Two times a day (BID) | ORAL | Status: DC

## 2014-07-29 ENCOUNTER — Other Ambulatory Visit: Payer: Self-pay | Admitting: Nurse Practitioner

## 2014-07-29 MED ORDER — TOBRAMYCIN-DEXAMETHASONE 0.3-0.1 % OP SUSP: 2.0000 [drp] | OPHTHALMIC | Status: DC

## 2014-07-31 ENCOUNTER — Encounter: Payer: Self-pay | Admitting: Family Medicine

## 2014-07-31 ENCOUNTER — Ambulatory Visit (INDEPENDENT_AMBULATORY_CARE_PROVIDER_SITE_OTHER): Payer: 59 | Admitting: Family Medicine

## 2014-07-31 VITALS — BP 125/71 | HR 69 | Temp 97.3°F | Ht 66.0 in | Wt 169.2 lb

## 2014-07-31 DIAGNOSIS — G43009 Migraine without aura, not intractable, without status migrainosus: Secondary | ICD-10-CM

## 2014-07-31 MED ORDER — DAKINS (1/2 STRENGTH) 0.25 % EX SOLN: CUTANEOUS | Status: DC

## 2014-07-31 NOTE — Progress Notes (Signed)
Subjective:  Patient ID: Carolyn Cooper, female    DOB: 1955/05/04  Age: 59 y.o. MRN: [id number]  CC: Migraine   HPI Carolyn Cooper presents for migraine headaches. She says she's had about 3 this month. These are 6/10. They are relieved by the triptan's. Primarily she is sumatriptan 10. Half a pill seems to be sufficient. She could not tolerate the topiramate so she discontinued that. She had previously discontinued the Pamelor as well. Both of them caused so much drowsiness that she was nonfunctional.  History Carolyn Cooper has a past medical history of IBS (irritable bowel syndrome); Migraines; and Osteopenia (2012).   She has past surgical history that includes Mouth surgery (1978); Cervical cone biopsy (1996); and Hernia repair.   Her family history includes Cancer in her maternal aunt; Diverticulitis in her mother; Heart disease in her father; Ovarian cancer in her maternal grandmother.She reports that she has never smoked. She has never used smokeless tobacco. She reports that she drinks alcohol. She reports that she does not use illicit drugs.  Current Outpatient Prescriptions on File Prior to Visit  Medication Sig Dispense Refill  . calcium carbonate (OS-CAL) 600 MG TABS Take 600 mg by mouth 2 (two) times daily with a meal.    . Cholecalciferol (VITAMIN D) 400 UNITS capsule Take 800 Units by mouth daily.      Marland Kitchen eletriptan (RELPAX) 40 MG tablet Take 1 tablet (40 mg total) by mouth as needed for migraine or headache. May repeat x1 in 24hours 10 tablet 0  . escitalopram (LEXAPRO) 20 MG tablet Take 1 tablet (20 mg total) by mouth daily. 90 tablet 3  . flurbiprofen (ANSAID) 100 MG tablet Take 1 tablet (100 mg total) by mouth daily. 45 tablet 1  . naratriptan (AMERGE) 2.5 MG tablet USE AS DIRECTED 9 tablet 2  . rizatriptan (MAXALT) 10 MG tablet Take 1 tablet (10 mg total) by mouth as needed for migraine. May repeat in 2 hours if needed 9 tablet 2  . SUMAtriptan (IMITREX) 100 MG tablet TAKE 1 TAB  BY MOUTH EVERY 2 HOURS AS NEEDED MAY REPEAT IN 2 HOURS IF HEADACHE PERSISTS 10 tablet 1  . terbinafine (LAMISIL) 250 MG tablet Take 1 tablet (250 mg total) by mouth daily. 30 tablet 2  . tobramycin-dexamethasone (TOBRADEX) ophthalmic solution Place 2 drops into both eyes every 4 (four) hours while awake. 5 mL 0  . Vitamin D, Ergocalciferol, (DRISDOL) 50000 UNITS CAPS capsule Take 1 capsule (50,000 Units total) by mouth 2 (two) times a week. 30 capsule 0   No current facility-administered medications on file prior to visit.    ROS Review of Systems  Constitutional: Negative for fever, chills, diaphoresis, activity change and appetite change.  HENT: Negative for congestion, ear pain, hearing loss, postnasal drip, rhinorrhea, sneezing, sore throat and trouble swallowing.   Eyes: Negative for pain.  Respiratory: Negative for cough, chest tightness and shortness of breath.   Cardiovascular: Negative for chest pain and palpitations.  Gastrointestinal: Negative for nausea, vomiting, abdominal pain, diarrhea and constipation.  Genitourinary: Negative for dysuria, frequency and menstrual problem.  Musculoskeletal: Negative for joint swelling and arthralgias.  Skin: Negative for rash.  Neurological: Positive for headaches. Negative for dizziness, speech difficulty, weakness and numbness.    Objective:  BP 125/71 mmHg  Pulse 69  Temp(Src) 97.3 F (36.3 C) (Oral)  Ht 5\' 6"  (1.676 m)  Wt 169 lb 3.2 oz (76.749 kg)  BMI 27.32 kg/m2  BP Readings from Last 3  Encounters:  07/31/14 125/71  06/25/14 129/79  10/31/13 116/61    Wt Readings from Last 3 Encounters:  07/31/14 169 lb 3.2 oz (76.749 kg)  06/25/14 176 lb 6.4 oz (80.015 kg)  01/30/14 168 lb (76.204 kg)     Physical Exam  Constitutional: She is oriented to person, place, and time. She appears well-developed and well-nourished. No distress.  HENT:  Head: Normocephalic and atraumatic.  Right Ear: External ear normal.  Left Ear:  External ear normal.  Nose: Nose normal.  Mouth/Throat: Oropharynx is clear and moist.  Eyes: Conjunctivae are normal. Pupils are equal, round, and reactive to light.  Neck: Normal range of motion. Neck supple. No thyromegaly present.  Cardiovascular: Normal rate, regular rhythm and normal heart sounds.   No murmur heard. Pulmonary/Chest: Effort normal and breath sounds normal. No respiratory distress. She has no wheezes. She has no rales.  Lymphadenopathy:    She has no cervical adenopathy.  Neurological: She is alert and oriented to person, place, and time. She has normal reflexes.  Skin: Skin is warm and dry.  Psychiatric: She has a normal mood and affect. Her behavior is normal. Judgment and thought content normal.    No results found for: HGBA1C  Lab Results  Component Value Date   WBC 8.5 06/25/2014   HGB 12.8 06/25/2014   HCT 40.6 06/25/2014   PLT 256 10/31/2013   GLUCOSE 88 06/25/2014   CHOL 193 06/25/2014   TRIG 98 06/25/2014   HDL 83 06/25/2014   LDLCALC 90 06/25/2014   ALT 19 06/25/2014   AST 16 06/25/2014   NA 142 06/25/2014   K 4.0 06/25/2014   CL 101 06/25/2014   CREATININE 0.84 06/25/2014   BUN 17 06/25/2014   CO2 26 06/25/2014   TSH 2.330 06/25/2014    Mm Screening Breast Tomo Bilateral  10/26/2013   CLINICAL DATA:  Screening.  EXAM: DIGITAL SCREENING BILATERAL MAMMOGRAM WITH 3D TOMO WITH CAD  COMPARISON:  Previous exam(s).  ACR Breast Density Category c: The breast tissue is heterogeneously dense, which may obscure small masses.  FINDINGS: There are no findings suspicious for malignancy. Images were processed with CAD.  IMPRESSION: No mammographic evidence of malignancy. A result letter of this screening mammogram will be mailed directly to the patient.  RECOMMENDATION: Screening mammogram in one year. (Code:SM-B-01Y)  BI-RADS CATEGORY  1: Negative.   Electronically Signed   By: Curlene Dolphin M.D.   On: 10/26/2013 13:01    Assessment & Plan:   Carolyn Cooper was  seen today for migraine.  Diagnoses and all orders for this visit:  Migraine without aura and without status migrainosus, not intractable  Other orders -     sodium hypochlorite (DAKIN'S 1/2 STRENGTH) external solution; Soak the toes in this solution 1-3 times daily. Use warm to hot water and add just enough to get the smell of the bleach. Soak for about 10 minutes. Dry and apply Vicks salve  I have discontinued Ms. Jeziorski's nortriptyline and topiramate. I am also having her start on sodium hypochlorite. Additionally, I am having her maintain her Vitamin D, calcium carbonate, naratriptan, eletriptan, SUMAtriptan, flurbiprofen, escitalopram, terbinafine, Vitamin D (Ergocalciferol), rizatriptan, and tobramycin-dexamethasone.  Meds ordered this encounter  Medications  . sodium hypochlorite (DAKIN'S 1/2 STRENGTH) external solution    Sig: Soak the toes in this solution 1-3 times daily. Use warm to hot water and add just enough to get the smell of the bleach. Soak for about 10 minutes. Dry and apply Vicks  salve    Dispense:  473 mL    Refill:  5     Follow-up: Return in about 6 months (around 01/30/2015).  Claretta Fraise, M.D.

## 2014-08-19 ENCOUNTER — Other Ambulatory Visit: Payer: Self-pay | Admitting: Nurse Practitioner

## 2014-08-19 ENCOUNTER — Telehealth: Payer: Self-pay | Admitting: *Deleted

## 2014-08-19 MED ORDER — OLOPATADINE HCL 0.1 % OP SOLN: 1.0000 [drp] | Freq: Two times a day (BID) | OPHTHALMIC | Status: DC

## 2014-08-19 NOTE — Telephone Encounter (Signed)
The drops that you gave her for her eyes, is not working. The are blurry and feels like a film is over them, also feels like sand in them.

## 2014-08-21 ENCOUNTER — Other Ambulatory Visit: Payer: Self-pay | Admitting: Family Medicine

## 2014-08-28 ENCOUNTER — Other Ambulatory Visit: Payer: Self-pay | Admitting: *Deleted

## 2014-08-28 MED ORDER — SUMATRIPTAN SUCCINATE 100 MG PO TABS: ORAL_TABLET | ORAL | Status: DC

## 2014-09-05 ENCOUNTER — Other Ambulatory Visit: Payer: Self-pay | Admitting: Nurse Practitioner

## 2014-09-05 MED ORDER — ALUMINUM CHLORIDE 20 % EX SOLN: Freq: Every day | CUTANEOUS | Status: DC

## 2014-09-12 ENCOUNTER — Other Ambulatory Visit: Payer: Self-pay | Admitting: *Deleted

## 2014-09-12 MED ORDER — RIZATRIPTAN BENZOATE 10 MG PO TABS: 10.0000 mg | ORAL_TABLET | ORAL | Status: DC | PRN

## 2014-10-03 ENCOUNTER — Other Ambulatory Visit: Payer: Self-pay | Admitting: Gynecology

## 2014-10-03 DIAGNOSIS — Z1231 Encounter for screening mammogram for malignant neoplasm of breast: Secondary | ICD-10-CM

## 2014-10-08 ENCOUNTER — Other Ambulatory Visit: Payer: Self-pay | Admitting: Family Medicine

## 2014-10-22 ENCOUNTER — Other Ambulatory Visit: Payer: Self-pay | Admitting: *Deleted

## 2014-10-22 MED ORDER — ELETRIPTAN HYDROBROMIDE 40 MG PO TABS: 40.0000 mg | ORAL_TABLET | ORAL | Status: DC | PRN

## 2014-10-23 ENCOUNTER — Ambulatory Visit (INDEPENDENT_AMBULATORY_CARE_PROVIDER_SITE_OTHER): Payer: 59 | Admitting: Physician Assistant

## 2014-10-23 ENCOUNTER — Encounter: Payer: Self-pay | Admitting: Physician Assistant

## 2014-10-23 VITALS — BP 134/80 | HR 72 | Temp 97.7°F | Ht 66.0 in | Wt 162.0 lb

## 2014-10-23 DIAGNOSIS — L570 Actinic keratosis: Secondary | ICD-10-CM | POA: Diagnosis not present

## 2014-10-23 DIAGNOSIS — D1800 Hemangioma unspecified site: Secondary | ICD-10-CM | POA: Diagnosis not present

## 2014-10-23 NOTE — Progress Notes (Signed)
   Subjective:    Patient ID: Carolyn Cooper, female    DOB: [birth year], 59 y.o.   MRN: [id number]  HPI 59 y/o female presents with c/o enlarging scaling lesion on bilateral hands. No h/o skin cancer.     Review of Systems  Skin:       Scaling lesions on bilateral dorsal surfaces of hands Dark lesions   All other systems reviewed and are negative.      Objective:   Physical Exam  Constitutional: She is oriented to person, place, and time.  Neurological: She is alert and oriented to person, place, and time.  Skin:  Hyperpigmented, scaling lesions on dorsal surface of bilateral hands.   Hemangioma on LLE distal , lateral to knee.   Psychiatric: She has a normal mood and affect. Her behavior is normal. Judgment and thought content normal.          Assessment & Plan:  1. Hemangioma Benign - no further treatment required  2. Actinic keratosis - areas on dorsal surface of hands treated with cryosurgery x 7. If lesions recur, may need biopsy    RTO prn   Brelyn Woehl A. Benjamin Stain PA-C

## 2014-10-24 ENCOUNTER — Other Ambulatory Visit: Payer: Self-pay | Admitting: *Deleted

## 2014-10-24 MED ORDER — NARATRIPTAN HCL 2.5 MG PO TABS: ORAL_TABLET | ORAL | Status: DC

## 2014-11-22 ENCOUNTER — Encounter: Payer: 59 | Admitting: Gynecology

## 2014-12-06 ENCOUNTER — Encounter: Payer: Self-pay | Admitting: Gynecology

## 2014-12-06 ENCOUNTER — Ambulatory Visit (HOSPITAL_COMMUNITY)
Admission: RE | Admit: 2014-12-06 | Discharge: 2014-12-06 | Disposition: A | Discharge status: Home / Self Care | Payer: 59 | Source: Ambulatory Visit | Attending: Gynecology | Admitting: Gynecology

## 2014-12-06 ENCOUNTER — Ambulatory Visit (INDEPENDENT_AMBULATORY_CARE_PROVIDER_SITE_OTHER): Payer: 59 | Admitting: Gynecology

## 2014-12-06 VITALS — BP 120/76 | Ht 65.0 in | Wt 166.0 lb

## 2014-12-06 DIAGNOSIS — Z1231 Encounter for screening mammogram for malignant neoplasm of breast: Secondary | ICD-10-CM | POA: Diagnosis present

## 2014-12-06 DIAGNOSIS — Z01419 Encounter for gynecological examination (general) (routine) without abnormal findings: Secondary | ICD-10-CM

## 2014-12-06 NOTE — Patient Instructions (Signed)

## 2014-12-06 NOTE — Progress Notes (Signed)
Carolyn Cooper 1955/06/06 [id number]        59 y.o.  G4P4 for annual exam.  Doing well without complaints  Past medical history,surgical history, problem list, medications, allergies, family history and social history were all reviewed and documented as reviewed in the EPIC chart.  ROS:  Performed with pertinent positives and negatives included in the history, assessment and plan.   Additional significant findings :  none   Exam: [name] Vitals:   12/06/14 1603  BP: 120/76  Height: 5\' 5"  (1.651 m)  Weight: 166 lb (75.297 kg)   General appearance:  Normal affect, orientation and appearance. Skin: Grossly normal HEENT: Without gross lesions.  No cervical or supraclavicular adenopathy. Thyroid normal.  Lungs:  Clear without wheezing, rales or rhonchi Cardiac: RR, without RMG Abdominal:  Soft, nontender, without masses, guarding, rebound, organomegaly or hernia Breasts:  Examined lying and sitting without masses, retractions, discharge or axillary adenopathy. Pelvic:  Ext/BUS/vagina normal with atrophic changes  Cervix with atrophic changes. Scarring from her cone biopsy.  Uterus anteverted, normal size, shape and contour, midline and mobile nontender   Adnexa  Without masses or tenderness    Anus and perineum  External hemorrhoids   Rectovaginal  Normal sphincter tone without palpated masses or tenderness.    Assessment/Plan:  59 y.o. G44P4 female for annual exam.   1. Postmenopausal/atrophic vaginal changes. Patient has weaned herself off of HRT and is doing well with occasional hot flash. No bleeding. Patient prefers to stay off of HRT. Will call if any issues or bleeding. 2. History of osteopenia 2012 T score -1.3. DEXA 2015 normal. Vitamin D level is past year was borderline low when she had been on extra vitamin D. She will have level checked at her primary physician's office. 3. Mammography today. Continue with annual mammography. SBE monthly reviewed. 4. Pap  smear/HPV negative 2015. No Pap smear done today. History of cone biopsy 1996 with normal Pap smears since then. 5. Colonoscopy 2010 with planned repeat interval 10 years. 6. External hemorrhoids. Occasionally bleeds. Does not want anything done with them. 7. Health maintenance. No routine blood work done as she had this done at her primary physician's office this past year. Follow up in one year, sooner as needed.   Anastasio Auerbach MD, 4:30 PM 12/06/2014

## 2014-12-19 ENCOUNTER — Ambulatory Visit (INDEPENDENT_AMBULATORY_CARE_PROVIDER_SITE_OTHER): Payer: 59 | Admitting: Family Medicine

## 2014-12-19 ENCOUNTER — Telehealth: Payer: Self-pay | Admitting: Family Medicine

## 2014-12-19 ENCOUNTER — Encounter: Payer: Self-pay | Admitting: Family Medicine

## 2014-12-19 VITALS — BP 114/77 | HR 80 | Temp 97.3°F | Ht 65.0 in | Wt 169.4 lb

## 2014-12-19 DIAGNOSIS — M533 Sacrococcygeal disorders, not elsewhere classified: Secondary | ICD-10-CM | POA: Diagnosis not present

## 2014-12-19 MED ORDER — NAPROXEN 500 MG PO TABS
500.0000 mg | ORAL_TABLET | Freq: Two times a day (BID) | ORAL | Status: DC
Start: 2014-12-19 — End: 2015-01-01

## 2014-12-19 NOTE — Progress Notes (Signed)
   HPI  Patient presents today for hip pain  She explains she has had smoldering indolent dull achy pain at her BL SI joints for about 1 year. It is moderate non limiting pain. It is worse in the am and gets better with moving around. She has no other joint stiffness or pains to be concerned for rheumatologic d/o. It seems to be slowly worsening.  She previously did well with rehab exercises and would like to try this again.   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   She has tried cymbalta which made her nauseous and elavil previously which made her too sleepy.  She is doing well on lexapro.   She denies groin pain or lateral hip pain. She denies injury and joint stiffness apart from her low back.    PMH: Smoking status noted ROS: Per HPI  Objective: BP 114/77 mmHg  Pulse 80  Temp(Src) 97.3 F (36.3 C) (Oral)  Ht 5\' 5"  (1.651 m)  Wt 169 lb 6.4 oz (76.839 kg)  BMI 28.19 kg/m2 Gen: NAD, alert, cooperative with exam HEENT: NCAT Resp: non labored Ext: No edema, warm Neuro: Alert and oriented, No gross deficits MSK:  Mild tenderness to palpation of BL SI joints No bony tenderness of lumbar spine  Assessment and plan:  # SI joint dysfunction Likely this, History not consistent with hip pathology Discussed rehab exercises, given handout from sports med advisor Schedule NSAIDs X 1 week while starting Refer to Dr. Charlann Boxer, Sports med, for possible manipulation and [facility] [place] to confirm or clarify Dx   Orders Placed This Encounter  Procedures  . Ambulatory referral to Sports Medicine    Referral Priority:  Routine    Referral Type:  Consultation    Number of Visits Requested:  1    Meds ordered this encounter  Medications  . naproxen (NAPROSYN) 500 MG tablet    Sig: Take 1 tablet (500 mg total) by mouth 2 (two) times daily with a meal.    Dispense:  30 tablet    Refill:  0    [name], MD Tristan Schroeder [facility] 12/19/2014, 9:54 AM

## 2014-12-19 NOTE — Patient Instructions (Signed)
   It sounds like SI joint dysfunction, NSAIDs can help for a few days while you start the stretches  I think seeing Dr. Tamala Julian will be beneficial.

## 2014-12-31 ENCOUNTER — Other Ambulatory Visit: Payer: Self-pay | Admitting: Family Medicine

## 2015-01-01 ENCOUNTER — Other Ambulatory Visit: Payer: Self-pay | Admitting: *Deleted

## 2015-01-02 MED ORDER — NAPROXEN 500 MG PO TABS: 500.0000 mg | ORAL_TABLET | Freq: Two times a day (BID) | ORAL | Status: DC

## 2015-01-10 ENCOUNTER — Encounter: Payer: Self-pay | Admitting: *Deleted

## 2015-01-10 ENCOUNTER — Encounter: Payer: Self-pay | Admitting: Family Medicine

## 2015-01-10 ENCOUNTER — Ambulatory Visit (INDEPENDENT_AMBULATORY_CARE_PROVIDER_SITE_OTHER): Payer: 59 | Admitting: Family Medicine

## 2015-01-10 VITALS — BP 132/80 | HR 69 | Ht 65.0 in | Wt 171.0 lb

## 2015-01-10 DIAGNOSIS — M533 Sacrococcygeal disorders, not elsewhere classified: Secondary | ICD-10-CM | POA: Diagnosis not present

## 2015-01-10 DIAGNOSIS — M9904 Segmental and somatic dysfunction of sacral region: Secondary | ICD-10-CM

## 2015-01-10 DIAGNOSIS — M9902 Segmental and somatic dysfunction of thoracic region: Secondary | ICD-10-CM | POA: Diagnosis not present

## 2015-01-10 DIAGNOSIS — M999 Biomechanical lesion, unspecified: Secondary | ICD-10-CM | POA: Insufficient documentation

## 2015-01-10 DIAGNOSIS — M9903 Segmental and somatic dysfunction of lumbar region: Secondary | ICD-10-CM | POA: Diagnosis not present

## 2015-01-10 NOTE — Progress Notes (Signed)
Pre visit review using our clinic review tool, if applicable. No additional management support is needed unless otherwise documented below in the visit note. 

## 2015-01-10 NOTE — Progress Notes (Signed)
[name] [address], [place] [address] Phone: [phone] Subjective:    I'm seeing this patient by the request  of:  Bradshaw MD.  CC: Back pain  Carolyn Cooper is a 59 y.o. female coming in with complaint of  Back pain. Patient's on the provider and there was concern for sacroiliac joint dysfunction. Patient describes the pain as more of a dull aching sensation and points to the bilateral sacroiliac joints for approximately 1 year. We describe it as moderate in severity. Does not limit her from her Cooper activities and seems to get better when she is moving. Patient states that she has multiple different other joints that seem to give her discomfort from time to time as well. Rates the severity of pain a 5 out of 10. Denies any radiation down the legs.   Wardell Honour, MD Past Surgical History  Procedure Laterality Date  . Mouth surgery  1978    WISDOM TEETH  . Cervical cone biopsy  1996   Social History  Substance Use Topics  . Smoking status: Never Smoker   . Smokeless tobacco: Never Used  . Alcohol Use: 0.0 oz/week    0 Standard drinks or equivalent per week     Comment: occassional   Allergies  Allergen Reactions  . Sulfa Antibiotics Hives   Family History  Problem Relation Age of Onset  . Ovarian cancer Maternal Grandmother   . Diverticulitis Mother   . Heart disease Father   . Breast cancer Maternal Aunt 65    Past medical history, social, surgical and family history all reviewed in electronic medical record.   Review of Systems: No headache, visual changes, nausea, vomiting, diarrhea, constipation, dizziness, abdominal pain, skin rash, fevers, chills, night sweats, weight loss, swollen lymph nodes, body aches, joint swelling, muscle aches, chest pain, shortness of breath, mood changes.   Objective Blood pressure 132/80, pulse 69, height 5\' 5"  (1.651 m), weight 171 lb (77.565 kg), SpO2 97 %.  General:  No apparent distress alert and oriented x3 mood and affect normal, dressed appropriately.  HEENT: Pupils equal, extraocular movements intact  Respiratory: Patient's speak in full sentences and does not appear short of breath  Cardiovascular: No lower extremity edema, non tender, no erythema  Skin: Warm dry intact with no signs of infection or rash on extremities or on axial skeleton.  Abdomen: Soft nontender  Neuro: Cranial nerves II through XII are intact, neurovascularly intact in all extremities with 2+ DTRs and 2+ pulses.  Lymph: No lymphadenopathy of posterior or anterior cervical chain or axillae bilaterally.  Gait normal with good balance and coordination.  MSK:  Non tender with full range of motion and good stability and symmetric strength and tone of shoulders, elbows, wrist, hip, knee and ankles bilaterally.  Back Exam:  Inspection: Unremarkable  Motion: Flexion 45 deg, Extension 45 deg, Side Bending to 45 deg bilaterally,  Rotation to 45 deg bilaterally  SLR laying: Negative  XSLR laying: Negative  Palpable tenderness: tender to palpation over the sacroiliac joints bilaterally minimally as well as over the greater trochanteric areas bilaterally. FABER: negative. Sensory change: Gross sensation intact to all lumbar and sacral dermatomes.  Reflexes: 2+ at both patellar tendons, 2+ at achilles tendons, Babinski's downgoing.  Strength at foot  Plantar-flexion: 5/5 Dorsi-flexion: 5/5 Eversion: 5/5 Inversion: 5/5  Leg strength  Quad: 5/5 Hamstring: 5/5 Hip flexor: 5/5 Hip abductors:3+/5  Gait unremarkable.  Osteopathic findings T3 extended rotated  and side bent right T7 extended rotated and side bent left L2 flexed rotated and side bent right Sacrum left on left  Procedure note [id number]; 15 minutes spent for Therapeutic exercises as stated in above notes.  This included exercises focusing on stretching, strengthening, with significant focus on eccentric aspects.Sacroiliac Joint  Mobilization and Rehab 1. Work on pretzel stretching, shoulder back and leg draped in front. 3-5 sets, 30 sec.. 2. hip abductor rotations. standing, hip flexion and rotation outward then inward. 3 sets, 15 reps. when can do comfortably, add ankle weights starting at 2 pounds.  3. cross over stretching - shoulder back to ground, same side leg crossover. 3-5 sets for 30 min..  4. rolling up and back knees to chest and rocking. 5. sacral tilt - 5 sets, hold for 5-10 seconds    Proper technique shown and discussed handout in great detail with ATC.  All questions were discussed and answered.      Impression and Recommendations:     This case required medical decision making of moderate complexity.

## 2015-01-10 NOTE — Assessment & Plan Note (Signed)
Patient does have some mild rotation of the sacroiliac joint. I do think that some of this is ergonomics at work. We discussed an adjustable standing desk that could be beneficial. We discussed icing regimen. Patient will try topical anti-inflammatory send natural supplementations. Patient worked with Product/process development scientist. Patient come back and 3-4 weeks for further evaluation and treatment.

## 2015-01-10 NOTE — Patient Instructions (Signed)
Good to see you Ice 20 minutes 2 times daily. Usually after activity and before bed. Exercises 3 times a week.  pennsaid pinkie amount topically 2 times daily as needed.  Turmeric 500mg  twice daily Vitamin D 2000 IU daily Good shoes Try to keep monitor at eye level When in a chair, keep tennis ball between shoulder blades Exercises on wall.  Heel and butt touching.  Raise leg 6 inches and hold 2 seconds.  Down slow for count of 4 seconds.  1 set of 30 reps daily on both sides.  See me again in 3-4 weeks.

## 2015-01-19 ENCOUNTER — Other Ambulatory Visit: Payer: Self-pay | Admitting: Family Medicine

## 2015-01-29 ENCOUNTER — Other Ambulatory Visit (INDEPENDENT_AMBULATORY_CARE_PROVIDER_SITE_OTHER): Payer: 59 | Admitting: *Deleted

## 2015-01-29 DIAGNOSIS — Z23 Encounter for immunization: Secondary | ICD-10-CM | POA: Diagnosis not present

## 2015-01-31 ENCOUNTER — Ambulatory Visit (INDEPENDENT_AMBULATORY_CARE_PROVIDER_SITE_OTHER): Payer: 59 | Admitting: Family Medicine

## 2015-01-31 ENCOUNTER — Encounter: Payer: Self-pay | Admitting: Family Medicine

## 2015-01-31 VITALS — BP 118/72 | HR 75 | Ht 65.0 in | Wt 176.0 lb

## 2015-01-31 DIAGNOSIS — M9904 Segmental and somatic dysfunction of sacral region: Secondary | ICD-10-CM

## 2015-01-31 DIAGNOSIS — M533 Sacrococcygeal disorders, not elsewhere classified: Secondary | ICD-10-CM

## 2015-01-31 DIAGNOSIS — M9903 Segmental and somatic dysfunction of lumbar region: Secondary | ICD-10-CM

## 2015-01-31 DIAGNOSIS — M999 Biomechanical lesion, unspecified: Secondary | ICD-10-CM

## 2015-01-31 NOTE — Progress Notes (Signed)
Pre visit review using our clinic review tool, if applicable. No additional management support is needed unless otherwise documented below in the visit note. 

## 2015-01-31 NOTE — Progress Notes (Signed)
[facility] [address], [place] [address] Phone: [phone] Subjective:    I'm seeing this patient by the request  of:  Bradshaw MD.  CC: Back pain  Carolyn Cooper is a 59 y.o. female coming in with complaint of  Back pain. Patient was seen previously for more of a sacroiliac joint dysfunction as well as poor posture causing some outpatient cervicogenic headaches. Discussed icing regimen, home exercises, and patient was to try topical anti-inflammatories and over-the-counter supplementations. Patient states she is 85-90% better. Feels that the exercises at the most helpful. Denies any numbness or tingling.   Wardell Honour, MD Past Surgical History  Procedure Laterality Date  . Mouth surgery  1978    WISDOM TEETH  . Cervical cone biopsy  1996   Social History  Substance Use Topics  . Smoking status: Never Smoker   . Smokeless tobacco: Never Used  . Alcohol Use: 0.0 oz/week    0 Standard drinks or equivalent per week     Comment: occassional   Allergies  Allergen Reactions  . Sulfa Antibiotics Hives   Family History  Problem Relation Age of Onset  . Ovarian cancer Maternal Grandmother   . Diverticulitis Mother   . Heart disease Father   . Breast cancer Maternal Aunt 16    Past medical history, social, surgical and family history all reviewed in electronic medical record.   Review of Systems: No headache, visual changes, nausea, vomiting, diarrhea, constipation, dizziness, abdominal pain, skin rash, fevers, chills, night sweats, weight loss, swollen lymph nodes, body aches, joint swelling, muscle aches, chest pain, shortness of breath, mood changes.   Objective Blood pressure 118/72, pulse 75, height 5\' 5"  (1.651 m), weight 176 lb (79.833 kg), SpO2 96 %.  General: No apparent distress alert and oriented x3 mood and affect normal, dressed appropriately.  HEENT: Pupils equal, extraocular movements intact    Respiratory: Patient's speak in full sentences and does not appear short of breath  Cardiovascular: No lower extremity edema, non tender, no erythema  Skin: Warm dry intact with no signs of infection or rash on extremities or on axial skeleton.  Abdomen: Soft nontender  Neuro: Cranial nerves II through XII are intact, neurovascularly intact in all extremities with 2+ DTRs and 2+ pulses.  Lymph: No lymphadenopathy of posterior or anterior cervical chain or axillae bilaterally.  Gait normal with good balance and coordination.  MSK:  Non tender with full range of motion and good stability and symmetric strength and tone of shoulders, elbows, wrist, hip, knee and ankles bilaterally.  Back Exam:  Inspection: Unremarkable  Motion: Flexion 45 deg, Extension 45 deg, Side Bending to 45 deg bilaterally,  Rotation to 45 deg bilaterally  SLR laying: Negative  XSLR laying: Negative  Palpable tenderness: Minimal tenderness from previous exam still though over the paraspinal musculature. FABER: negative. Sensory change: Gross sensation intact to all lumbar and sacral dermatomes.  Reflexes: 2+ at both patellar tendons, 2+ at achilles tendons, Babinski's downgoing.  Strength at foot  Plantar-flexion: 5/5 Dorsi-flexion: 5/5 Eversion: 5/5 Inversion: 5/5  Leg strength  Quad: 5/5 Hamstring: 5/5 Hip flexor: 5/5 Hip abductors:+/5  Gait unremarkable.  Osteopathic findings T3 extended rotated and side bent right T7 extended rotated and side bent left L2 flexed rotated and side bent right Sacrum left on left      Impression and Recommendations:     This case required medical decision making of moderate complexity.

## 2015-01-31 NOTE — Assessment & Plan Note (Addendum)
Decision today to treat with OMT was based on Physical Exam  After verbal consent patient was treated with HVLA, muscle energy, FPR techniques in cervical, thoracic, lumbar areas  Patient tolerated the procedure well with improvement in symptoms  Patient given exercises, stretches and lifestyle modifications  See medications in patient instructions if given  Patient will follow up in 6 weeks

## 2015-01-31 NOTE — Patient Instructions (Addendum)
Great to see you Ice will be your friend when you need it Continue the exercises at least 2-3 times a week Continue the vitamins No real changes otherwise See me again in 6-8 weeks.

## 2015-01-31 NOTE — Assessment & Plan Note (Signed)
Patient is doing significantly better at this time. Encourage her to continue the same conservative therapy. Patient will continue with the same vitamin supplementation. We discussed icing can be beneficial. Patient was follow-up again in 6 weeks.

## 2015-03-11 ENCOUNTER — Encounter: Payer: Self-pay | Admitting: Family Medicine

## 2015-03-11 ENCOUNTER — Ambulatory Visit (INDEPENDENT_AMBULATORY_CARE_PROVIDER_SITE_OTHER): Payer: 59 | Admitting: Family Medicine

## 2015-03-11 VITALS — BP 126/83 | HR 74 | Temp 97.3°F | Ht 65.0 in | Wt 174.0 lb

## 2015-03-11 DIAGNOSIS — J209 Acute bronchitis, unspecified: Secondary | ICD-10-CM

## 2015-03-11 MED ORDER — BENZONATATE 100 MG PO CAPS: 100.0000 mg | ORAL_CAPSULE | Freq: Three times a day (TID) | ORAL | Status: DC | PRN

## 2015-03-11 MED ORDER — AZITHROMYCIN 250 MG PO TABS: ORAL_TABLET | ORAL | Status: DC

## 2015-03-11 MED ORDER — GUAIFENESIN-CODEINE 100-10 MG/5ML PO SOLN: 5.0000 mL | Freq: Three times a day (TID) | ORAL | Status: DC | PRN

## 2015-03-11 NOTE — Patient Instructions (Signed)
Thanks for letting me take care of you!  Do not drive after the cough syrup, it does have a mild narcotic in it- Codeine.   Tessalon can be very helpful for daytime cough  Nasal saline would also be helpful, neti pots work great but be sure to use only city water or bottled water.   Acute Bronchitis Bronchitis is when the airways that extend from the windpipe into the lungs get red, puffy, and painful (inflamed). Bronchitis often causes thick spit (mucus) to develop. This leads to a cough. A cough is the most common symptom of bronchitis. In acute bronchitis, the condition usually begins suddenly and goes away over time (usually in 2 weeks). Smoking, allergies, and asthma can make bronchitis worse. Repeated episodes of bronchitis may cause more lung problems. HOME CARE  Rest.  Drink enough fluids to keep your pee (urine) clear or pale yellow (unless you need to limit fluids as told by your doctor).  Only take over-the-counter or prescription medicines as told by your doctor.  Avoid smoking and secondhand smoke. These can make bronchitis worse. If you are a smoker, think about using nicotine gum or skin patches. Quitting smoking will help your lungs heal faster.  Reduce the chance of getting bronchitis again by:  Washing your hands often.  Avoiding people with cold symptoms.  Trying not to touch your hands to your mouth, nose, or eyes.  Follow up with your doctor as told. GET HELP IF: Your symptoms do not improve after 1 week of treatment. Symptoms include:  Cough.  Fever.  Coughing up thick spit.  Body aches.  Chest congestion.  Chills.  Shortness of breath.  Sore throat. GET HELP RIGHT AWAY IF:   You have an increased fever.  You have chills.  You have severe shortness of breath.  You have bloody thick spit (sputum).  You throw up (vomit) often.  You lose too much body fluid (dehydration).  You have a severe headache.  You faint. MAKE SURE YOU:    Understand these instructions.  Will watch your condition.  Will get help right away if you are not doing well or get worse.   This information is not intended to replace advice given to you by your health care provider. Make sure you discuss any questions you have with your health care provider.   Document Released: 09/08/2007 Document Revised: 11/22/2012 Document Reviewed: 09/12/2012 Elsevier Interactive Patient Education [employer].

## 2015-03-11 NOTE — Progress Notes (Signed)
    HPI  Patient presents today for acute illness She is an Therapist, sports in my clinic  She reports 2 weeks of sinus congestion and non productive cough. She has been using OTC cough syrup for night time cough and vitamin C with no impropvement.  She feels she is getting worse still.  No dyspnea, chest pain, or facial pain.  NO ear pain.   PMH: Smoking status noted ROS: Per HPI  Objective: BP 126/83 mmHg  Pulse 74  Temp(Src) 97.3 F (36.3 C) (Oral)  Ht 5\' 5"  (1.651 m)  Wt 174 lb (78.926 kg)  BMI 28.96 kg/m2 Gen: NAD, alert, cooperative with exam HEENT: NCAT, TMs WNL BL, Nares clear, oropharynx clear, PERRLA CV: RRR, good S1/S2, no murmur Resp: CTABL, no wheezes, non-labored Ext: No edema, warm Neuro: Alert and oriented, No gross deficits  Assessment and plan:  # Acute bronchitis Reassuring lung exam, considering course of illness I think antibiotics are appropriate.  Azithro Codeine cough syrup for night time cough Tessalon RTC if worsening or fails to improve.     Meds ordered this encounter  Medications  . azithromycin (ZITHROMAX) 250 MG tablet    Sig: Take 2 tablets on day 1 and 1 tablet daily after that    Dispense:  6 tablet    Refill:  0  . guaiFENesin-codeine 100-10 MG/5ML syrup    Sig: Take 5 mLs by mouth 3 (three) times daily as needed for cough.    Dispense:  120 mL    Refill:  0  . benzonatate (TESSALON PERLES) 100 MG capsule    Sig: Take 1 capsule (100 mg total) by mouth 3 (three) times daily as needed for cough.    Dispense:  20 capsule    Refill:  [place], MD [facility] 03/11/2015, 9:52 AM

## 2015-03-14 ENCOUNTER — Ambulatory Visit: Payer: 59 | Admitting: Family Medicine

## 2015-04-04 ENCOUNTER — Other Ambulatory Visit (INDEPENDENT_AMBULATORY_CARE_PROVIDER_SITE_OTHER): Payer: 59

## 2015-04-04 ENCOUNTER — Encounter: Payer: Self-pay | Admitting: Family Medicine

## 2015-04-04 ENCOUNTER — Ambulatory Visit (INDEPENDENT_AMBULATORY_CARE_PROVIDER_SITE_OTHER): Payer: 59 | Admitting: Family Medicine

## 2015-04-04 VITALS — BP 128/84 | HR 75 | Ht 65.0 in | Wt 176.0 lb

## 2015-04-04 DIAGNOSIS — S83411A Sprain of medial collateral ligament of right knee, initial encounter: Secondary | ICD-10-CM | POA: Diagnosis not present

## 2015-04-04 DIAGNOSIS — M25561 Pain in right knee: Secondary | ICD-10-CM

## 2015-04-04 DIAGNOSIS — S83419A Sprain of medial collateral ligament of unspecified knee, initial encounter: Secondary | ICD-10-CM | POA: Insufficient documentation

## 2015-04-04 NOTE — Progress Notes (Signed)
Pre visit review using our clinic review tool, if applicable. No additional management support is needed unless otherwise documented below in the visit note. 

## 2015-04-04 NOTE — Assessment & Plan Note (Signed)
Patient does have more of a sprain of the MCL. I do think there is some degenerative changes of the meniscus as well. Patient elected try conservative therapy. We discussed topical anti-inflammatory and prescription was given, icing, and home exercises. Patient work with Product/process development scientist. We discussed if any instability the patient should come back for further evaluation. We may need x-rays as well as advanced imaging. We tested discussed that if patient has any worsening symptoms she should come back sooner otherwise will return to be reevaluated in 4 weeks.  Spent  25 minutes with patient face-to-face and had greater than 50% of counseling including as described above in assessment and plan.

## 2015-04-04 NOTE — Patient Instructions (Addendum)
Great to see you Happy New Year! Exercises 3 times a week.  Exercises 3 times a week.  pennsaid pinkie amount topically 2 times daily as needed.  See me again in 3-4 weeks to make sure healing well

## 2015-04-04 NOTE — Progress Notes (Signed)
[facility] [address], [place] [address] Phone: [phone] Subjective:    Chief complaint: Right knee pain  [id number] Carolyn Cooper is a 59 y.o. female coming in with complaint of  Right knee pain. States that his been 2 months. Does not remember injury but woke up with right-sided knee swelling. Pain hurts more of the medial aspect. Swelling resolved after 24 hours. Continued to have discomfort. Some twisting motions can give her discomfort. Has a past medical history significant for left-sided meniscal tear and states that this feels somewhat similar. Denies any radiation down the leg, any locking or giving out on her. Does respond to oral anti-inflammatories. Not changing daily activities but can have discomfort with them. No nighttime awakenings.   Kenn File, MD Past Surgical History  Procedure Laterality Date  . Mouth surgery  1978    WISDOM TEETH  . Cervical cone biopsy  1996   Social History  Substance Use Topics  . Smoking status: Never Smoker   . Smokeless tobacco: Never Used  . Alcohol Use: 0.0 oz/week    0 Standard drinks or equivalent per week     Comment: occassional   Allergies  Allergen Reactions  . Sulfa Antibiotics Hives   Family History  Problem Relation Age of Onset  . Ovarian cancer Maternal Grandmother   . Diverticulitis Mother   . Heart disease Father   . Breast cancer Maternal Aunt 47    Past medical history, social, surgical and family history all reviewed in electronic medical record.   Review of Systems: No headache, visual changes, nausea, vomiting, diarrhea, constipation, dizziness, abdominal pain, skin rash, fevers, chills, night sweats, weight loss, swollen lymph nodes, body aches, joint swelling, muscle aches, chest pain, shortness of breath, mood changes.   Objective Blood pressure 128/84, pulse 75, height 5\' 5"  (1.651 m), weight 176 lb (79.833 kg), SpO2 94 %.  General: No apparent  distress alert and oriented x3 mood and affect normal, dressed appropriately.  HEENT: Pupils equal, extraocular movements intact  Respiratory: Patient's speak in full sentences and does not appear short of breath  Cardiovascular: No lower extremity edema, non tender, no erythema  Skin: Warm dry intact with no signs of infection or rash on extremities or on axial skeleton.  Abdomen: Soft nontender  Neuro: Cranial nerves II through XII are intact, neurovascularly intact in all extremities with 2+ DTRs and 2+ pulses.  Lymph: No lymphadenopathy of posterior or anterior cervical chain or axillae bilaterally.  Gait normal with good balance and coordination.  MSK:  Non tender with full range of motion and good stability and symmetric strength and tone of shoulders, elbows, wrist, hip, and ankles bilaterally.  Knee: Right Normal to inspection with no erythema or effusion or obvious bony abnormalities. Pain over the medial joint line ROM full in flexion and extension and lower leg rotation. Ligaments with solid consistent endpoints including ACL, PCL, LCL, MCL. Patient does have pain with MCL stress Equivocal Mcmurray's, Apley's, and Thessalonian tests. Non painful patellar compression. Patellar glide with mild crepitus. Patellar and quadriceps tendons unremarkable. Hamstring and quadriceps strength is normal.    MSK US performed of: Right knee This study was ordered, performed, and interpreted by Charlann Boxer D.O.  Knee: All structures visualized. Anteromedial, meniscus does have some mild digit changes and maybe some mild displacement. Increasing Doppler flow noted. No true tear appreciated though. Patellar Tendon unremarkable on long and transverse views without effusion. No abnormality of prepatellar  bursa. LCL unremarkable but patient's MCL does have what appears to be a very small 5% tear on the articular side as well as a 5% tear on the superficial to the side. Mild increase in Doppler flow  and hypoechoic changes. No retraction and its insertion on the tibia.  IMPRESSION:  Partial tearing of the MCL as well as some degenerative changes of the medial meniscus.  Procedure note [id number]; 15 minutes spent for Therapeutic exercises as stated in above notes.  This included exercises focusing on stretching, strengthening, with significant focus on eccentric aspects.  Flexion and extension exercises working on hip abductor's as well as abductor's. Patient given exercises to work on the vastus medialis oblique as well as h core stabilization. Proper technique shown and discussed handout in great detail with ATC.  All questions were discussed and answered.      Impression and Recommendations:     This case required medical decision making of moderate complexity.

## 2015-04-07 ENCOUNTER — Other Ambulatory Visit: Payer: Self-pay | Admitting: Family Medicine

## 2015-04-09 ENCOUNTER — Telehealth: Payer: Self-pay | Admitting: Family Medicine

## 2015-04-09 MED ORDER — RIZATRIPTAN BENZOATE 10 MG PO TABS: ORAL_TABLET | ORAL | Status: DC

## 2015-04-09 NOTE — Telephone Encounter (Signed)
Pt aware.

## 2015-04-09 NOTE — Telephone Encounter (Signed)
Prescription sent. Thanks, WS. 

## 2015-04-25 ENCOUNTER — Ambulatory Visit: Payer: 59 | Admitting: Family Medicine

## 2015-06-21 ENCOUNTER — Other Ambulatory Visit: Payer: Self-pay | Admitting: Family Medicine

## 2015-06-25 ENCOUNTER — Ambulatory Visit (INDEPENDENT_AMBULATORY_CARE_PROVIDER_SITE_OTHER): Payer: 59 | Admitting: Pharmacist

## 2015-06-25 ENCOUNTER — Encounter: Payer: Self-pay | Admitting: Pharmacist

## 2015-06-25 VITALS — Ht 65.0 in | Wt 178.0 lb

## 2015-06-25 DIAGNOSIS — R635 Abnormal weight gain: Secondary | ICD-10-CM | POA: Diagnosis not present

## 2015-06-25 DIAGNOSIS — E663 Overweight: Secondary | ICD-10-CM

## 2015-06-25 MED ORDER — NALTREXONE-BUPROPION HCL ER 8-90 MG PO TB12: ORAL_TABLET | ORAL | Status: DC

## 2015-06-25 NOTE — Progress Notes (Signed)
Patient ID: Carolyn Cooper, female   DOB: Dec 29, 1955, 60 y.o.   MRN: [id number] Subjective:     Carolyn Cooper is a 60 y.o. female who I am asked to see in consultation for evaluation and treatment of overweight. Patient cites health, increased physical ability, self-image as reasons for wanting to lose weight.  Obesity History Weight in late teens: 110 lbs. Period of greatest weight gain: 20 lbs after last pregnancy Lowest adult weight: 110 Highest adult weight: 176   History of Weight Loss Efforts Weight watchers - 5 months, lost 10 lbs - but was always hungry. phntermine in past - lost about 12 lbs  Current Exercise Habits none  Current Eating Habits Number of regular meals per day: 3 Number of snacking episodes per day: 1 - ice cream at night Who shops for food? patient Who prepares food? patient Who eats with patient? patient, son and husband Binge behavior?: yes - ice cream at night Purge behavior? no Anorexic behavior? no Eating precipitated by stress? no Guilt feelings associated with eating? yes -    Other Potential Contributing Factors Use of alcohol: average 0 drinks/week Use of medications that may cause weight gain none Comorbidities: none The following portions of the patient's history were reviewed and updated as appropriate: allergies, current medications, past family history, past medical history, past social history, past surgical history and problem list.    Objective:    Ht 5\' 5"  (1.651 m)  Wt 178 lb (80.74 kg)  BMI 29.62 kg/m2 Body mass index is 29.62 kg/(m^2).    Assessment:    Over weight with BMI and comorbidities as noted above. Patient readiness to commit to diet and activity changes: good     Plan:    1. Sent in Rx for Contrave - to start with 1 tablet qam for 1 week, then 1 tablet bid for 1 week, then 2 tabs qam and 1 tablet qpm for 1 week, then 2 tablets bid there after.  Will stop Lexapro when starts Contrave 2. . Diet interventions:  moderate (500 kCal/d) deficit diet Proper food choices reviewed: yes Careful meal planning; avoiding ad hoc eating: yes Stimulus control to control unhealthy eating: yes Handouts given: sample diet given 4. Exercise intervention:  Informal measures, e.g. taking stairs instead of elevator: yes Formal exercise regimen: yes - patient interested in starting to bike for exercise 5. Follow up: 4 weeks and as needed.    Cherre Robins, PharmD, CPP

## 2015-07-07 ENCOUNTER — Telehealth: Payer: Self-pay | Admitting: *Deleted

## 2015-07-07 NOTE — Telephone Encounter (Signed)
Pt wanted to know if you would write her a prescription for phentermine, she says ya'll have discussed it.

## 2015-07-09 MED ORDER — PHENTERMINE HCL 37.5 MG PO TABS: 37.5000 mg | ORAL_TABLET | Freq: Every day | ORAL | Status: DC

## 2015-07-09 NOTE — Telephone Encounter (Signed)
Rx called into [facility] for #[facility] Recheck BP and weight in 1 month.

## 2015-07-15 ENCOUNTER — Ambulatory Visit (INDEPENDENT_AMBULATORY_CARE_PROVIDER_SITE_OTHER): Payer: 59 | Admitting: Family

## 2015-07-15 ENCOUNTER — Encounter: Payer: Self-pay | Admitting: Family

## 2015-07-15 VITALS — BP 138/75 | HR 70 | Temp 98.5°F | Ht 65.0 in | Wt 174.6 lb

## 2015-07-15 DIAGNOSIS — R300 Dysuria: Secondary | ICD-10-CM | POA: Diagnosis not present

## 2015-07-15 DIAGNOSIS — N3001 Acute cystitis with hematuria: Secondary | ICD-10-CM | POA: Diagnosis not present

## 2015-07-15 LAB — MICROSCOPIC EXAMINATION

## 2015-07-15 LAB — URINALYSIS, COMPLETE
Bilirubin, UA: NEGATIVE
GLUCOSE, UA: NEGATIVE
KETONES UA: NEGATIVE
NITRITE UA: NEGATIVE
Protein, UA: NEGATIVE
Specific Gravity, UA: 1.01 (ref 1.005–1.030)
UUROB: 0.2 mg/dL (ref 0.2–1.0)
pH, UA: 6.5 (ref 5.0–7.5)

## 2015-07-15 MED ORDER — NITROFURANTOIN MONOHYD MACRO 100 MG PO CAPS: 100.0000 mg | ORAL_CAPSULE | Freq: Two times a day (BID) | ORAL | Status: DC

## 2015-07-15 NOTE — Progress Notes (Signed)
   Subjective:    Patient ID: Carolyn Cooper, female    DOB: 16-Nov-1955, 60 y.o.   MRN: [id number]  Urinary Tract Infection  This is a new problem. The current episode started yesterday. The problem occurs every urination. The problem has been gradually worsening. The quality of the pain is described as aching. The pain is at a severity of 4/10. The pain is mild. There has been no fever. Associated symptoms include frequency, hematuria, hesitancy and urgency. Pertinent negatives include no chills, discharge, nausea or vomiting. The treatment provided no relief.      Review of Systems  Constitutional: Negative.  Negative for chills.  HENT: Negative.   Eyes: Negative.   Respiratory: Negative.  Negative for shortness of breath.   Cardiovascular: Negative.  Negative for palpitations.  Gastrointestinal: Negative.  Negative for nausea and vomiting.  Endocrine: Negative.   Genitourinary: Positive for hesitancy, urgency, frequency and hematuria.  Musculoskeletal: Negative.   Neurological: Negative.  Negative for headaches.  Hematological: Negative.   Psychiatric/Behavioral: Negative.   All other systems reviewed and are negative.      Objective:   Physical Exam  Constitutional: She is oriented to person, place, and time. She appears well-developed and well-nourished. No distress.  Eyes: Pupils are equal, round, and reactive to light.  Neck: Normal range of motion. Neck supple. No thyromegaly present.  Cardiovascular: Normal rate, regular rhythm, normal heart sounds and intact distal pulses.   No murmur heard. Pulmonary/Chest: Effort normal and breath sounds normal. No respiratory distress. She has no wheezes.  Abdominal: Soft. Bowel sounds are normal. She exhibits no distension. There is no tenderness.  Musculoskeletal: Normal range of motion. She exhibits no edema or tenderness.  Negative for CVA tenderness  Neurological: She is alert and oriented to person, place, and time.  Skin: Skin  is warm and dry.  Psychiatric: She has a normal mood and affect. Her behavior is normal. Judgment and thought content normal.  Vitals reviewed.    BP 138/75 mmHg  Pulse 70  Temp(Src) 98.5 F (36.9 C) (Oral)  Ht 5\' 5"  (1.651 m)  Wt 174 lb 9.6 oz (79.198 kg)  BMI 29.05 kg/m2      Assessment & Plan:  1. Dysuria - Urinalysis, Complete  2. Acute cystitis with hematuria -Force fluids AZO over the counter X2 days RTO prn Culture pending - nitrofurantoin, macrocrystal-monohydrate, (MACROBID) 100 MG capsule; Take 1 capsule (100 mg total) by mouth 2 (two) times daily.  Dispense: 10 capsule; Refill: 0  Evelina Dun, FNP

## 2015-07-15 NOTE — Patient Instructions (Signed)

## 2015-07-16 LAB — URINE CULTURE

## 2015-07-17 ENCOUNTER — Telehealth: Payer: Self-pay | Admitting: Family Medicine

## 2015-07-17 MED ORDER — NARATRIPTAN HCL 2.5 MG PO TABS: ORAL_TABLET | ORAL | Status: DC

## 2015-07-17 NOTE — Telephone Encounter (Signed)
RX refilled  

## 2015-07-17 NOTE — Telephone Encounter (Signed)
Pt notified of rx. 

## 2015-07-17 NOTE — Telephone Encounter (Signed)
Please review and advise.

## 2015-08-01 ENCOUNTER — Other Ambulatory Visit: Payer: Self-pay

## 2015-08-01 MED ORDER — RIZATRIPTAN BENZOATE 10 MG PO TABS: ORAL_TABLET | ORAL | Status: DC

## 2015-08-02 ENCOUNTER — Other Ambulatory Visit: Payer: Self-pay | Admitting: *Deleted

## 2015-08-02 MED ORDER — RIZATRIPTAN BENZOATE 10 MG PO TABS: ORAL_TABLET | ORAL | Status: DC

## 2015-08-07 ENCOUNTER — Ambulatory Visit: Payer: Self-pay | Admitting: Pharmacist

## 2015-08-29 ENCOUNTER — Encounter: Payer: Self-pay | Admitting: Podiatry

## 2015-08-29 ENCOUNTER — Ambulatory Visit (INDEPENDENT_AMBULATORY_CARE_PROVIDER_SITE_OTHER): Payer: 59 | Admitting: Podiatry

## 2015-08-29 VITALS — BP 115/73 | HR 85 | Resp 16 | Ht 65.0 in | Wt 172.0 lb

## 2015-08-29 DIAGNOSIS — B351 Tinea unguium: Secondary | ICD-10-CM

## 2015-08-29 NOTE — Progress Notes (Signed)
   Subjective:    Patient ID: Carolyn Cooper, female    DOB: 1956/01/20, 60 y.o.   MRN: [id number]  HPI Chief Complaint  Patient presents with  . Nail Problem    Bilateral; great toes; nail discoloration & thickened nails; pt stated, "Has used Lamisil for the past year and it did not help; wants to discuss laser treatment"      Review of Systems  Neurological: Positive for headaches.  All other systems reviewed and are negative.      Objective:   Physical Exam        Assessment & Plan:

## 2015-08-31 NOTE — Progress Notes (Signed)
Subjective:     Patient ID: Carolyn Cooper, female   DOB: May 28, 1955, 60 y.o.   MRN: [id number]  HPI patient presents with multiple year history of discoloration of her hallux nails and took one full year of Lamisil out resolution of symptoms. Is interested laser or other treatments we may have   Review of Systems  All other systems reviewed and are negative.      Objective:   Physical Exam  Constitutional: She is oriented to person, place, and time.  Cardiovascular: Intact distal pulses.   Musculoskeletal: Normal range of motion.  Neurological: She is oriented to person, place, and time.  Skin: Skin is warm.  Nursing note and vitals reviewed.  neurovascular status intact muscle strength adequate range of motion within normal limits with patient found to have discoloration of the distal two thirds of the nailbed bilateral with yellow like discoloration and no skin irritations noted. Patient's found have good digital perfusion and is well oriented 3     Assessment:     Possibility for fungal infection versus trauma or other unknown condition of nailbeds    Plan:     H&P conditions reviewed with patient. Today I went ahead and I did a culture of the nail to try to find out what may be causing this problem and patient will be seen back when we get the results

## 2015-09-04 ENCOUNTER — Other Ambulatory Visit: Payer: Self-pay | Admitting: *Deleted

## 2015-09-05 MED ORDER — SUMATRIPTAN SUCCINATE 100 MG PO TABS: ORAL_TABLET | ORAL | Status: DC

## 2015-09-09 ENCOUNTER — Other Ambulatory Visit: Payer: Self-pay | Admitting: Family Medicine

## 2015-09-09 DIAGNOSIS — Z1231 Encounter for screening mammogram for malignant neoplasm of breast: Secondary | ICD-10-CM

## 2015-09-11 ENCOUNTER — Encounter: Payer: Self-pay | Admitting: Family Medicine

## 2015-09-11 ENCOUNTER — Ambulatory Visit (INDEPENDENT_AMBULATORY_CARE_PROVIDER_SITE_OTHER): Payer: 59 | Admitting: Family Medicine

## 2015-09-11 VITALS — BP 124/79 | HR 89 | Temp 97.5°F | Ht 65.0 in | Wt 173.0 lb

## 2015-09-11 DIAGNOSIS — R358 Other polyuria: Secondary | ICD-10-CM

## 2015-09-11 DIAGNOSIS — Z0000 Encounter for general adult medical examination without abnormal findings: Secondary | ICD-10-CM | POA: Insufficient documentation

## 2015-09-11 DIAGNOSIS — M2550 Pain in unspecified joint: Secondary | ICD-10-CM | POA: Diagnosis not present

## 2015-09-11 DIAGNOSIS — E559 Vitamin D deficiency, unspecified: Secondary | ICD-10-CM

## 2015-09-11 DIAGNOSIS — R3589 Other polyuria: Secondary | ICD-10-CM

## 2015-09-11 MED ORDER — MELOXICAM 7.5 MG PO TABS: 7.5000 mg | ORAL_TABLET | Freq: Every day | ORAL | Status: DC

## 2015-09-11 NOTE — Progress Notes (Signed)
   HPI  Patient presents today here for follow-up, annual exam, and achy joints.  Pt is an RN in our clinic  Patient explains that over the last 2-3 months she's had increased achiness in her bilateral knees, left shoulder, and neck. It lasts about 5-10 minutes after she wakes up in the morning and ate some throughout the day.  She's been has another try medications. She previously tried Tumeric with no improvement. She has no discrete injuries or predominant pains to complain of.  The pains wake her up throughout the night, she falls back to sleep easily.  Healthcare maintenance She has a mammogram scheduled She would like labs done, she is not fasting today but she will return fasting.  Vitamin D deficiency She takes a daily supplement, she previously took 50,000 international units weekly for replacement.  PMH: Smoking status noted ROS: Per HPI  Objective: BP 124/79 mmHg  Pulse 89  Temp(Src) 97.5 F (36.4 C) (Oral)  Ht 5' 5" (1.651 m)  Wt 173 lb (78.472 kg)  BMI 28.79 kg/m2 Gen: NAD, alert, cooperative with exam HEENT: NCAT, EOMI, PERRL, TMs normal bilaterally, oropharynx clear CV: RRR, good S1/S2, no murmur Resp: CTABL, no wheezes, non-labored Abd: SNTND, BS present, no guarding or organomegaly Ext: No edema, warm Neuro: Alert and oriented, drink 5/5 and sensation intact in bilateral upper and lower extremities, 2+ patellar tendon reflexes, normal gait, EOMI, PERRLA  Assessment and plan:   # Arthralgias Short-term trial of mobic, 7.5 mg daily Discussed risks of using NSAIDs long-term  # Vitamin D deficiency Recheck levels, continue daily supplement  # Healthcare maintenance Checking hepatitis C  Annual physical exam Normal exam, healthcare maintenance is largely up-to-date, hepatitis C was added Pap smear is up-to-date, mammogram is scheduled, colonoscopy is also up-to-date.     Orders Placed This Encounter  Procedures  . CMP14+EGFR    Standing  Status: Future     Number of Occurrences:      Standing Expiration Date: 09/10/2016  . CBC with Differential/Platelet    Standing Status: Future     Number of Occurrences:      Standing Expiration Date: 09/10/2016  . Hepatitis C antibody    Standing Status: Future     Number of Occurrences:      Standing Expiration Date: 09/10/2016  . Lipid panel    Standing Status: Future     Number of Occurrences:      Standing Expiration Date: 09/10/2016  . TSH    Standing Status: Future     Number of Occurrences:      Standing Expiration Date: 09/10/2016  . VITAMIN D 25 Hydroxy (Vit-D Deficiency, Fractures)    Standing Status: Future     Number of Occurrences:      Standing Expiration Date: 09/10/2016    Meds ordered this encounter  Medications  . meloxicam (MOBIC) 7.5 MG tablet    Sig: Take 1 tablet (7.5 mg total) by mouth daily.    Dispense:  30 tablet    Refill:  1    Sam Bradshaw, MD [facility] 09/11/2015, 5:43 PM      

## 2015-09-11 NOTE — Addendum Note (Signed)
Addended by: Timmothy Euler on: 09/11/2015 05:49 PM   Modules accepted: Orders, Level of Service

## 2015-09-12 ENCOUNTER — Other Ambulatory Visit: Payer: 59

## 2015-09-12 DIAGNOSIS — R358 Other polyuria: Secondary | ICD-10-CM

## 2015-09-12 DIAGNOSIS — R3589 Other polyuria: Secondary | ICD-10-CM

## 2015-09-12 DIAGNOSIS — Z0000 Encounter for general adult medical examination without abnormal findings: Secondary | ICD-10-CM

## 2015-09-12 DIAGNOSIS — E559 Vitamin D deficiency, unspecified: Secondary | ICD-10-CM

## 2015-09-12 LAB — URINALYSIS, COMPLETE
BILIRUBIN UA: NEGATIVE
Glucose, UA: NEGATIVE
KETONES UA: NEGATIVE
LEUKOCYTES UA: NEGATIVE
Nitrite, UA: NEGATIVE
PROTEIN UA: NEGATIVE
SPEC GRAV UA: 1.01 (ref 1.005–1.030)
Urobilinogen, Ur: 0.2 mg/dL (ref 0.2–1.0)
pH, UA: 6.5 (ref 5.0–7.5)

## 2015-09-12 LAB — MICROSCOPIC EXAMINATION

## 2015-09-13 ENCOUNTER — Other Ambulatory Visit: Payer: Self-pay | Admitting: Family Medicine

## 2015-09-13 DIAGNOSIS — R3129 Other microscopic hematuria: Secondary | ICD-10-CM | POA: Insufficient documentation

## 2015-09-13 LAB — CMP14+EGFR
ALBUMIN: 4.4 g/dL (ref 3.5–5.5)
ALK PHOS: 120 IU/L — AB (ref 39–117)
ALT: 13 IU/L (ref 0–32)
AST: 14 IU/L (ref 0–40)
Albumin/Globulin Ratio: 1.8 (ref 1.2–2.2)
BILIRUBIN TOTAL: 0.4 mg/dL (ref 0.0–1.2)
BUN / CREAT RATIO: 19 (ref 9–23)
BUN: 15 mg/dL (ref 6–24)
CHLORIDE: 100 mmol/L (ref 96–106)
CO2: 23 mmol/L (ref 18–29)
Calcium: 9.7 mg/dL (ref 8.7–10.2)
Creatinine, Ser: 0.81 mg/dL (ref 0.57–1.00)
GFR calc Af Amer: 92 mL/min/{1.73_m2} (ref >59)
GFR calc non Af Amer: 80 mL/min/{1.73_m2} (ref >59)
GLOBULIN, TOTAL: 2.4 g/dL (ref 1.5–4.5)
GLUCOSE: 99 mg/dL (ref 65–99)
Potassium: 4.2 mmol/L (ref 3.5–5.2)
SODIUM: 142 mmol/L (ref 134–144)
Total Protein: 6.8 g/dL (ref 6.0–8.5)

## 2015-09-13 LAB — CBC WITH DIFFERENTIAL/PLATELET
BASOS ABS: 0 10*3/uL (ref 0.0–0.2)
Basos: 1 %
EOS (ABSOLUTE): 0.1 10*3/uL (ref 0.0–0.4)
Eos: 2 %
Hematocrit: 40.2 % (ref 34.0–46.6)
Hemoglobin: 13.4 g/dL (ref 11.1–15.9)
Immature Grans (Abs): 0 10*3/uL (ref 0.0–0.1)
Immature Granulocytes: 0 %
LYMPHS ABS: 1.3 10*3/uL (ref 0.7–3.1)
Lymphs: 23 %
MCH: 31.5 pg (ref 26.6–33.0)
MCHC: 33.3 g/dL (ref 31.5–35.7)
MCV: 95 fL (ref 79–97)
MONOS ABS: 0.5 10*3/uL (ref 0.1–0.9)
Monocytes: 9 %
NEUTROS PCT: 65 %
Neutrophils Absolute: 3.5 10*3/uL (ref 1.4–7.0)
PLATELETS: 301 10*3/uL (ref 150–379)
RBC: 4.25 x10E6/uL (ref 3.77–5.28)
RDW: 12.8 % (ref 12.3–15.4)
WBC: 5.4 10*3/uL (ref 3.4–10.8)

## 2015-09-13 LAB — TSH: TSH: 2.6 u[IU]/mL (ref 0.450–4.500)

## 2015-09-13 LAB — LIPID PANEL
CHOLESTEROL TOTAL: 182 mg/dL (ref 100–199)
Chol/HDL Ratio: 2.4 ratio units (ref 0.0–4.4)
HDL: 77 mg/dL (ref >39)
LDL Calculated: 93 mg/dL (ref 0–99)
Triglycerides: 58 mg/dL (ref 0–149)
VLDL CHOLESTEROL CAL: 12 mg/dL (ref 5–40)

## 2015-09-13 LAB — VITAMIN D 25 HYDROXY (VIT D DEFICIENCY, FRACTURES): Vit D, 25-Hydroxy: 34.6 ng/mL (ref 30.0–100.0)

## 2015-09-13 LAB — HEPATITIS C ANTIBODY

## 2015-09-23 ENCOUNTER — Telehealth: Payer: Self-pay | Admitting: *Deleted

## 2015-09-23 MED ORDER — ESCITALOPRAM OXALATE 20 MG PO TABS: ORAL_TABLET | ORAL | Status: DC

## 2015-09-23 NOTE — Telephone Encounter (Signed)
done

## 2015-09-24 ENCOUNTER — Other Ambulatory Visit: Payer: Self-pay | Admitting: *Deleted

## 2015-09-24 MED ORDER — NARATRIPTAN HCL 2.5 MG PO TABS: ORAL_TABLET | ORAL | Status: DC

## 2015-10-01 ENCOUNTER — Other Ambulatory Visit: Payer: Self-pay | Admitting: Family Medicine

## 2015-10-06 ENCOUNTER — Encounter: Payer: Self-pay | Admitting: Podiatry

## 2015-10-10 ENCOUNTER — Ambulatory Visit: Payer: 59 | Admitting: Podiatry

## 2015-10-24 ENCOUNTER — Ambulatory Visit (INDEPENDENT_AMBULATORY_CARE_PROVIDER_SITE_OTHER): Payer: 59 | Admitting: Podiatry

## 2015-10-24 ENCOUNTER — Encounter: Payer: Self-pay | Admitting: Podiatry

## 2015-10-24 DIAGNOSIS — B351 Tinea unguium: Secondary | ICD-10-CM | POA: Diagnosis not present

## 2015-10-24 MED ORDER — FLUCONAZOLE 150 MG PO TABS: 150.0000 mg | ORAL_TABLET | Freq: Once | ORAL | Status: DC

## 2015-10-29 ENCOUNTER — Other Ambulatory Visit: Payer: 59

## 2015-10-29 NOTE — Progress Notes (Signed)
Subjective:     Patient ID: Carolyn Cooper, female   DOB: 1955-12-28, 60 y.o.   MRN: [id number]  HPI patient presents stating I want to know if there's anything we can do for my nail   Review of Systems     Objective:   Physical Exam Patient's noted to have thick nails hallux bilateral with yellow brittle discoloration and patient's currently wearing polish and returns for discussion of pathology    Assessment:     Reviewed pathology with patient indicating a mycotic infection    Plan:     Reviewed mycotic infection and recommended laser therapy pulse oral therapy and topical. We will start in the fall and we will do approximately 4 treatments for this condition

## 2015-11-18 ENCOUNTER — Encounter: Payer: Self-pay | Admitting: Nurse Practitioner

## 2015-11-18 ENCOUNTER — Ambulatory Visit (INDEPENDENT_AMBULATORY_CARE_PROVIDER_SITE_OTHER): Payer: 59 | Admitting: Nurse Practitioner

## 2015-11-18 VITALS — BP 136/88 | HR 84 | Temp 97.2°F | Ht 65.0 in | Wt 188.0 lb

## 2015-11-18 DIAGNOSIS — L918 Other hypertrophic disorders of the skin: Secondary | ICD-10-CM | POA: Diagnosis not present

## 2015-11-18 NOTE — Patient Instructions (Signed)
Dressing Change A dressing is a material placed over wounds. It keeps the wound clean, dry, and protected from further injury.  BEFORE YOU BEGIN  Get your supplies together. Things you may need include:  Salt solution (saline).  Flexible gauze bandage.  Medicated cream.  Tape.  Gloves.  Belly (abdominal) pads.  Gauze squares.  Plastic bags.  Take pain medicine 30 minutes before the bandage change if you need it.  Take a shower before you do the first bandage change of the day. Put plastic wrap or a bag over the dressing. REMOVING YOUR OLD BANDAGE  Wash your hands with soap and water. Dry your hands with a clean towel.  Put on your gloves.  Remove any tape.  Remove the old bandage as told. If it sticks, put a small amount of warm water on it to loosen the bandage.  Remove any gauze or packing tape in your wound.  Take off your gloves.  Put the gloves, tape, gauze, or any packing tape in a plastic bag. CHANGING YOUR BANDAGE  Open the supplies.  Take the cap off the salt solution.  Open the gauze. Leave the gauze on the inside of the package.  Put on your gloves.  Clean your wound as told by your doctor.  Keep your wound dry if your doctor told you to do so.  Your doctor may tell you to do one or more of the following:  Pick up the gauze. Pour the salt solution over the gauze. Squeeze out the extra salt solution.  Put medicated cream or other medicine on your wound.  Put solution soaked gauze only in your wound, not on the skin around it.  Pack your wound loosely.  Put dry gauze on your wound.  Put belly pads over the dry gauze if your bandages soak through.  Tape the bandage in place so it will not fall off. Do not wrap the tape all the way around your arm or leg.  Wrap the bandage with the flexible gauze bandage as told by your doctor.  Take off your gloves. Put them in the plastic bag with the old bandage. Tie the bag shut and throw it  away.  Keep the bandage clean and dry.  Wash your hands. GET HELP RIGHT AWAY IF:   Your skin around the wound looks red.  Your wound feels more tender or sore.  You see yellowish-white fluid (pus) in the wound.  Your wound smells bad.  You have a fever.  Your skin around the wound has a red rash that itches and burns.  You see black or yellow skin in your wound that was not there before.  You feel sick to your stomach (nauseous), throw up (vomit), and feel very tired.   This information is not intended to replace advice given to you by your health care provider. Make sure you discuss any questions you have with your health care provider.   Document Released: 06/18/2008 Document Revised: 04/12/2014 Document Reviewed: 01/31/2011 Elsevier Interactive Patient Education [employer].

## 2015-11-18 NOTE — Progress Notes (Signed)
   Subjective:    Patient ID: Carolyn Cooper, female    DOB: 1955/11/11, 60 y.o.   MRN: [id number]  HPI Patient has skin tag on right inner breast area that she wants removed.    Review of Systems  Constitutional: Negative.   Respiratory: Negative.   Cardiovascular: Negative.   Genitourinary: Negative.   Neurological: Negative.   Psychiatric/Behavioral: Negative.   All other systems reviewed and are negative.      Objective:   Physical Exam  Constitutional: She is oriented to person, place, and time. She appears well-developed and well-nourished. No distress.  Cardiovascular: Normal rate, regular rhythm and normal heart sounds.   Pulmonary/Chest: Effort normal and breath sounds normal.  Neurological: She is alert and oriented to person, place, and time.  Skin:  Flesh colored skin tag right inner breast   Procedure;   Betadine topically  Snipped with stevens scissors  Ferric subsulfate for bleeding  Cleaned with NACL  Antibiotic ointment and bandaid applied         Assessment & Plan:   1. Skin tag    Keep clean and dry Watch for sign of infection RTO prn  Mary-Margaret Hassell Done, FNP

## 2015-12-19 ENCOUNTER — Encounter: Payer: Self-pay | Admitting: Gynecology

## 2015-12-19 ENCOUNTER — Other Ambulatory Visit: Payer: Self-pay | Admitting: Family Medicine

## 2015-12-19 ENCOUNTER — Ambulatory Visit
Admission: RE | Admit: 2015-12-19 | Discharge: 2015-12-19 | Disposition: A | Discharge status: Home / Self Care | Payer: 59 | Source: Ambulatory Visit | Attending: Family Medicine | Admitting: Family Medicine

## 2015-12-19 ENCOUNTER — Ambulatory Visit (INDEPENDENT_AMBULATORY_CARE_PROVIDER_SITE_OTHER): Payer: 59 | Admitting: Gynecology

## 2015-12-19 VITALS — BP 120/70 | Ht 66.0 in | Wt 180.0 lb

## 2015-12-19 DIAGNOSIS — N952 Postmenopausal atrophic vaginitis: Secondary | ICD-10-CM | POA: Diagnosis not present

## 2015-12-19 DIAGNOSIS — Z01419 Encounter for gynecological examination (general) (routine) without abnormal findings: Secondary | ICD-10-CM | POA: Diagnosis not present

## 2015-12-19 DIAGNOSIS — Z1231 Encounter for screening mammogram for malignant neoplasm of breast: Secondary | ICD-10-CM

## 2015-12-19 NOTE — Patient Instructions (Signed)

## 2015-12-19 NOTE — Progress Notes (Signed)
    KIZ SANTAANA 1956-03-30 [id number]        60 y.o.  G4P4  for annual exam.  Doing well without complaints.  Past medical history,surgical history, problem list, medications, allergies, family history and social history were all reviewed and documented as reviewed in the EPIC chart.  ROS:  Performed with pertinent positives and negatives included in the history, assessment and plan.   Additional significant findings :  None   Exam: [name] assistant Vitals:   12/19/15 1102  BP: 120/70  Weight: 180 lb (81.6 kg)  Height: 5\' 6"  (1.676 m)   Body mass index is 29.05 kg/m.  General appearance:  Normal affect, orientation and appearance. Skin: Grossly normal HEENT: Without gross lesions.  No cervical or supraclavicular adenopathy. Thyroid normal.  Lungs:  Clear without wheezing, rales or rhonchi Cardiac: RR, without RMG Abdominal:  Soft, nontender, without masses, guarding, rebound, organomegaly or hernia Breasts:  Examined lying and sitting without masses, retractions, discharge or axillary adenopathy. Pelvic:  Ext/BUS/Vagina with atrophic changes  Cervix with atrophic changes  Uterus anteverted, normal size, shape and contour, midline and mobile nontender   Adnexa without masses or tenderness    Anus and perineum normal   Rectovaginal normal sphincter tone without palpated masses or tenderness.    Assessment/Plan:  60 y.o. G22P4 female for annual exam.  1. Postmenopausal/atrophic genital changes. Off of her HRT doing well without significant hot flushes, night sweats, vaginal dryness or any vaginal bleeding. Continue to monitor report any issues or bleeding. 2. DEXA 2015 normal. Prior DEXA 2012 T score -1.3. Recommend repeat DEXA at 5 year interval. Increase calcium vitamin D. 3. Mammography today. Continue with annual mammography. SBE monthly reviewed. 4. Pap smear/HPV 2015 negative. No Pap smear done today. History of cone biopsy 1996 with normal Pap smears afterwards.  Plan repeat Pap smear approaching 5 year interval per current screening guidelines. 5. Colonoscopy 2010 with reported repeat interval 10 years. 6. Health maintenance. No routine blood work done. Does relate being told that she had blood in her urine previously at primary physician's office. Check urinalysis today. Follow up in one year, sooner as needed.   Anastasio Auerbach MD, 11:27 AM 12/19/2015

## 2015-12-20 LAB — URINALYSIS W MICROSCOPIC + REFLEX CULTURE
Bacteria, UA: NONE SEEN [HPF]
Bilirubin Urine: NEGATIVE
CASTS: NONE SEEN [LPF]
Crystals: NONE SEEN [HPF]
GLUCOSE, UA: NEGATIVE
Hgb urine dipstick: NEGATIVE
KETONES UR: NEGATIVE
LEUKOCYTES UA: NEGATIVE
NITRITE: NEGATIVE
PH: 6 (ref 5.0–8.0)
Protein, ur: NEGATIVE
RBC / HPF: NONE SEEN RBC/HPF (ref <=2)
SPECIFIC GRAVITY, URINE: 1.001 (ref 1.001–1.035)
Squamous Epithelial / LPF: NONE SEEN [HPF] (ref <=5)
WBC UA: NONE SEEN WBC/HPF (ref <=5)
Yeast: NONE SEEN [HPF]

## 2015-12-26 ENCOUNTER — Other Ambulatory Visit: Payer: 59

## 2016-01-01 ENCOUNTER — Ambulatory Visit: Payer: 59 | Admitting: Family Medicine

## 2016-01-13 ENCOUNTER — Telehealth: Payer: Self-pay | Admitting: *Deleted

## 2016-01-13 MED ORDER — NYSTATIN 100000 UNIT/GM EX POWD: Freq: Two times a day (BID) | CUTANEOUS | 1 refills | Status: DC

## 2016-01-13 NOTE — Telephone Encounter (Signed)
Okay to prescribe nystatin powder.

## 2016-01-13 NOTE — Telephone Encounter (Signed)
Sent and aware

## 2016-01-20 ENCOUNTER — Other Ambulatory Visit: Payer: 59

## 2016-01-27 ENCOUNTER — Ambulatory Visit (INDEPENDENT_AMBULATORY_CARE_PROVIDER_SITE_OTHER): Payer: 59

## 2016-01-27 DIAGNOSIS — B351 Tinea unguium: Secondary | ICD-10-CM

## 2016-01-27 MED ORDER — NONFORMULARY OR COMPOUNDED ITEM: 1.0000 g | Freq: Every day | 3 refills | Status: DC

## 2016-01-27 NOTE — Progress Notes (Signed)
Pt presents with mycotic infection of nails hallux and 5th bilateral  All other systems are negative  Laser therapy administered to affected nails and tolerated well. All safety precautions were in place. Pt inquired about use of a topical medication. [facility] nail topical was written and faxed. Pt informed that she should hear from them within the next few days. Re-appointed in 1 month for 2nd of 4 treatments

## 2016-02-04 ENCOUNTER — Ambulatory Visit (INDEPENDENT_AMBULATORY_CARE_PROVIDER_SITE_OTHER): Payer: 59 | Admitting: Pediatrics

## 2016-02-04 ENCOUNTER — Encounter: Payer: Self-pay | Admitting: Pediatrics

## 2016-02-04 VITALS — BP 120/77 | HR 72 | Temp 98.4°F | Ht 66.0 in

## 2016-02-04 DIAGNOSIS — L853 Xerosis cutis: Secondary | ICD-10-CM

## 2016-02-04 DIAGNOSIS — H578 Other specified disorders of eye and adnexa: Secondary | ICD-10-CM | POA: Diagnosis not present

## 2016-02-04 DIAGNOSIS — H5789 Other specified disorders of eye and adnexa: Secondary | ICD-10-CM

## 2016-02-04 NOTE — Progress Notes (Signed)
  Subjective:   Patient ID: Carolyn Cooper, female    DOB: Sep 20, 1955, 60 y.o.   MRN: [id number] CC: Rash (Eyes) and Itchy Eyes  HPI: Carolyn Cooper is a 60 y.o. female presenting for Rash (Eyes) and Itchy Eyes  Contacts from eye doctor last week, first time using contacts, eye doctor put in L eye, on the way home started seeing double, tried to take contact out, it was no longer in place Initially felt like something was caught in eye, feels better now Eye lid R side slightly irritated, weeping slightly at times Under L eye slightly red and puffy Has been using new make up remover wipes Using mascara, eye liner, no other make up Vision is back to normal now Eyes dont feel dry  Hands b/l slightly itchy Has been scratching Doesn't use any moisturizer or other creams on hands regularly   Relevant past medical, surgical, family and social history reviewed. Allergies and medications reviewed and updated. History  Smoking Status  . Never Smoker  Smokeless Tobacco  . Never Used   ROS: Per HPI   Objective:    BP 120/77   Pulse 72   Temp 98.4 F (36.9 C) (Oral)   Ht 5\' 6"  (1.676 m)   Wt Readings from Last 3 Encounters:  12/19/15 180 lb (81.6 kg)  11/18/15 188 lb (85.3 kg)  09/11/15 173 lb (78.5 kg)    Gen: NAD, alert, cooperative with exam, NCAT EYES: EOMI, no pain with EOM, no foreign bodies seen in eye, no conjunctival injection, or no icterus, slightly pink in tissue below L eye, no tenderness. R eye lid with slight pink, shiny in crease of eye lid, no swelling. LYMPH: no cervical LAD CV: NRRR, normal S1/S2, no murmur, distal pulses 2+ b/l Neuro: Alert and oriented Skin: hands b/l with slightly dry, flaking skin with some excoriations dorsal surface b/l  Assessment & Plan:  Carolyn Cooper was seen today for rash around eyes.   Diagnoses and all orders for this visit:  Eye irritation Stop all eye ointments, minimize eye make up, use new products if necessary Avoid contacts for  now  Xerosis of skin Moisturize at least twice a day, sleep with moisturizer, hands in gloves if needed. Avoid fragrance in lotions.  Follow up plan: prn Assunta Found, MD [place]

## 2016-02-16 ENCOUNTER — Other Ambulatory Visit: Payer: Self-pay | Admitting: *Deleted

## 2016-02-16 MED ORDER — NARATRIPTAN HCL 2.5 MG PO TABS: ORAL_TABLET | ORAL | 3 refills | Status: DC

## 2016-02-19 ENCOUNTER — Ambulatory Visit: Payer: 59 | Admitting: Family Medicine

## 2016-02-19 ENCOUNTER — Other Ambulatory Visit: Payer: Self-pay | Admitting: Family Medicine

## 2016-02-19 DIAGNOSIS — M898X8 Other specified disorders of bone, other site: Secondary | ICD-10-CM

## 2016-02-24 ENCOUNTER — Ambulatory Visit: Payer: Self-pay

## 2016-02-24 DIAGNOSIS — B351 Tinea unguium: Secondary | ICD-10-CM

## 2016-02-25 NOTE — Progress Notes (Signed)
Pt presents with mycotic infection of nails hallux and 5th bilateral  All other systems are negative  Laser therapy administered to affected nails and tolerated well. All safety precautions were in place. Pt inquired about use of a topical medication. [facility] nail topical was written and faxed. Pt informed that she should hear from them within the next few days. Re-appointed in 1 month for 4th and final treatment

## 2016-02-28 ENCOUNTER — Ambulatory Visit (INDEPENDENT_AMBULATORY_CARE_PROVIDER_SITE_OTHER): Payer: 59 | Admitting: Family Medicine

## 2016-02-28 ENCOUNTER — Encounter: Payer: Self-pay | Admitting: Family Medicine

## 2016-02-28 VITALS — BP 122/79 | HR 71 | Temp 98.3°F | Ht 66.0 in | Wt 175.0 lb

## 2016-02-28 DIAGNOSIS — J040 Acute laryngitis: Secondary | ICD-10-CM

## 2016-02-28 MED ORDER — AZITHROMYCIN 250 MG PO TABS: ORAL_TABLET | ORAL | 0 refills | Status: DC

## 2016-02-28 NOTE — Progress Notes (Signed)
BP 122/79   Pulse 71   Temp 98.3 F (36.8 C) (Oral)   Ht 5\' 6"  (1.676 m)   Wt 175 lb (79.4 kg)   BMI 28.25 kg/m    Subjective:    Patient ID: Carolyn Cooper, female    DOB: 22-Dec-1955, 60 y.o.   MRN: [id number]  HPI: Carolyn Cooper is a 60 y.o. female presenting on 02/28/2016 for Cough and Laryngitis   HPI Cough and sore throat and voice change Patient has been having cough and sore throat and voice change this from going on for the past day and a half. She denies any fevers or chills or shortness of breath or wheezing. The cough has caused her to almost completely lose her voice. She also feels like she is having sinus pressure and postnasal drainage along with it. She has used some Afrin and ibuprofen which helped some.  Relevant past medical, surgical, family and social history reviewed and updated as indicated. Interim medical history since our last visit reviewed. Allergies and medications reviewed and updated.  Review of Systems  Constitutional: Negative for chills and fever.  HENT: Positive for congestion, postnasal drip, rhinorrhea, sinus pressure, sore throat and voice change. Negative for ear discharge, ear pain and sneezing.   Eyes: Negative for pain, redness and visual disturbance.  Respiratory: Positive for cough. Negative for chest tightness, shortness of breath and wheezing.   Cardiovascular: Negative for chest pain and leg swelling.  Genitourinary: Negative for difficulty urinating and dysuria.  Musculoskeletal: Negative for back pain and gait problem.  Skin: Negative for rash.  Neurological: Negative for light-headedness and headaches.  Psychiatric/Behavioral: Negative for agitation and behavioral problems.  All other systems reviewed and are negative.   Per HPI unless specifically indicated above     Objective:    BP 122/79   Pulse 71   Temp 98.3 F (36.8 C) (Oral)   Ht 5\' 6"  (1.676 m)   Wt 175 lb (79.4 kg)   BMI 28.25 kg/m   Wt Readings from Last 3  Encounters:  02/28/16 175 lb (79.4 kg)  12/19/15 180 lb (81.6 kg)  11/18/15 188 lb (85.3 kg)    Physical Exam  Constitutional: She is oriented to person, place, and time. She appears well-developed and well-nourished. No distress.  HENT:  Right Ear: Tympanic membrane, external ear and ear canal normal.  Left Ear: Tympanic membrane, external ear and ear canal normal.  Nose: Mucosal edema and rhinorrhea present. No epistaxis. Right sinus exhibits no maxillary sinus tenderness and no frontal sinus tenderness. Left sinus exhibits no maxillary sinus tenderness and no frontal sinus tenderness.  Mouth/Throat: Uvula is midline and mucous membranes are normal. Posterior oropharyngeal edema and posterior oropharyngeal erythema present. No oropharyngeal exudate or tonsillar abscesses.  Eyes: Conjunctivae are normal.  Cardiovascular: Normal rate, regular rhythm, normal heart sounds and intact distal pulses.   No murmur heard. Pulmonary/Chest: Effort normal and breath sounds normal. No respiratory distress. She has no wheezes. She has no rales.  Musculoskeletal: Normal range of motion. She exhibits no edema or tenderness.  Neurological: She is alert and oriented to person, place, and time. Coordination normal.  Skin: Skin is warm and dry. No rash noted. She is not diaphoretic.  Psychiatric: She has a normal mood and affect. Her behavior is normal.  Vitals reviewed.     Assessment & Plan:   Problem List Items Addressed This Visit    None    Visit Diagnoses    Laryngitis,  acute    -  Primary   Likely viral, recommended Flonase and ibuprofen and Mucinex and nasal saline sprays and antihistamine. Send azithromycin in case does not improve or worsens   Relevant Medications   azithromycin (ZITHROMAX) 250 MG tablet       Follow up plan: Return if symptoms worsen or fail to improve.  Counseling provided for all of the vaccine components No orders of the defined types were placed in this  encounter.   Caryl Pina, MD [facility] 02/28/2016, 10:32 AM

## 2016-03-19 ENCOUNTER — Ambulatory Visit (HOSPITAL_COMMUNITY)
Admission: RE | Admit: 2016-03-19 | Discharge: 2016-03-19 | Disposition: A | Discharge status: Home / Self Care | Payer: 59 | Source: Ambulatory Visit | Attending: Family Medicine | Admitting: Family Medicine

## 2016-03-19 DIAGNOSIS — M898X8 Other specified disorders of bone, other site: Secondary | ICD-10-CM | POA: Diagnosis not present

## 2016-03-22 ENCOUNTER — Telehealth: Payer: Self-pay | Admitting: Family Medicine

## 2016-03-22 MED ORDER — PREDNISONE 20 MG PO TABS: 40.0000 mg | ORAL_TABLET | Freq: Every day | ORAL | 0 refills | Status: DC

## 2016-03-22 NOTE — Telephone Encounter (Signed)
Dscussed Injection vs course of oral prednisone. Pt opts for oral course.   Sent to [place], MD [facility] 03/22/2016, 2:39 PM

## 2016-03-23 ENCOUNTER — Other Ambulatory Visit: Payer: Self-pay

## 2016-03-23 DIAGNOSIS — B351 Tinea unguium: Secondary | ICD-10-CM

## 2016-04-05 HISTORY — PX: CATARACT EXTRACTION, BILATERAL: SHX1313

## 2016-04-14 ENCOUNTER — Telehealth: Payer: Self-pay

## 2016-04-14 NOTE — Telephone Encounter (Signed)
Will you send in a script for Zomig? Please advise

## 2016-04-15 MED ORDER — ZOLMITRIPTAN 5 MG PO TABS: 2.5000 mg | ORAL_TABLET | ORAL | 11 refills | Status: DC | PRN

## 2016-04-15 NOTE — Telephone Encounter (Signed)
Rx sent to [facility].   [name], MD [facility] 04/15/2016, 7:43 AM

## 2016-04-30 ENCOUNTER — Encounter: Payer: Self-pay | Admitting: Family Medicine

## 2016-04-30 ENCOUNTER — Ambulatory Visit (INDEPENDENT_AMBULATORY_CARE_PROVIDER_SITE_OTHER): Payer: 59 | Admitting: Family Medicine

## 2016-04-30 VITALS — BP 139/76 | HR 83 | Temp 98.0°F | Ht 66.0 in | Wt 181.0 lb

## 2016-04-30 DIAGNOSIS — M7062 Trochanteric bursitis, left hip: Secondary | ICD-10-CM

## 2016-04-30 DIAGNOSIS — M25552 Pain in left hip: Secondary | ICD-10-CM | POA: Diagnosis not present

## 2016-04-30 NOTE — Patient Instructions (Signed)
Great to see you!   

## 2016-04-30 NOTE — Progress Notes (Signed)
   HPI  Patient presents today here with greater trochanteric bursitis.  Patient's lines over the last 2 months or so she's had persistent left and right hip pain over the lateral hip, left is worse in the right. Left is very annoying at night when she is trying to lie on her side. She's tried stretching with no improvement She's also tried ice.  She requests steroid injection of the greater trochanter bursa  PMH: Smoking status noted ROS: Per HPI  Objective: BP 139/76   Pulse 83   Temp 98 F (36.7 C) (Oral)   Ht 5\' 6"  (1.676 m)   Wt 181 lb (82.1 kg)   BMI 29.21 kg/m  Gen: NAD, alert, cooperative with exam HEENT: NCAT CV: RRR, good S1/S2, no murmur Resp: CTABL, no wheezes, non-labored Ext: No edema, warm Neuro: Alert and oriented, No gross deficits MSK:  Tenderness to palpation of the left greater trochanter, less tenderness to palpation of the right greater trochanter  Greater trochanteric bursal injection Informed consent was reviewed and signed, placed on the chart Area was cleaned with Betadine 2 and wiped clear with alcohol. Using cold spray the area was anesthetized, then using a ring 2-gauge 1-1/2 inch needle 3 mL of 1% lidocaine without epinephrine and 1 mL of 40 mg/mL Kenalog were injected. The patient tolerated the procedure very easily, sterile bandage was placed.   Assessment and plan:  # Greater trochanteric bursitis As evidenced by MRI in Dec  Treated with bursal injection today as above Discussed and provided handout with stretches. Consider injection of the other into 3 weeks if not improved.     [name], MD [facility] 04/30/2016, 5:29 PM

## 2016-05-14 ENCOUNTER — Other Ambulatory Visit: Payer: Self-pay | Admitting: Family Medicine

## 2016-05-14 MED ORDER — OSELTAMIVIR PHOSPHATE 75 MG PO CAPS: 75.0000 mg | ORAL_CAPSULE | Freq: Every day | ORAL | 0 refills | Status: DC

## 2016-05-18 ENCOUNTER — Other Ambulatory Visit: Payer: Self-pay | Admitting: *Deleted

## 2016-05-18 MED ORDER — NARATRIPTAN HCL 2.5 MG PO TABS: ORAL_TABLET | ORAL | 3 refills | Status: DC

## 2016-05-18 NOTE — Telephone Encounter (Signed)
Just filled, this is to be placed on file.

## 2016-06-22 ENCOUNTER — Telehealth: Payer: Self-pay | Admitting: *Deleted

## 2016-06-22 MED ORDER — ESCITALOPRAM OXALATE 20 MG PO TABS: ORAL_TABLET | ORAL | 0 refills | Status: DC

## 2016-06-22 NOTE — Telephone Encounter (Signed)
done

## 2016-07-21 ENCOUNTER — Other Ambulatory Visit: Payer: Self-pay | Admitting: *Deleted

## 2016-07-22 MED ORDER — RIZATRIPTAN BENZOATE 10 MG PO TABS: 10.0000 mg | ORAL_TABLET | ORAL | 0 refills | Status: DC | PRN

## 2016-08-05 ENCOUNTER — Other Ambulatory Visit: Payer: Self-pay | Admitting: *Deleted

## 2016-08-05 MED ORDER — RIZATRIPTAN BENZOATE 10 MG PO TABS: 10.0000 mg | ORAL_TABLET | ORAL | 0 refills | Status: DC | PRN

## 2016-08-19 ENCOUNTER — Other Ambulatory Visit: Payer: Self-pay | Admitting: Family Medicine

## 2016-09-18 ENCOUNTER — Other Ambulatory Visit: Payer: Self-pay | Admitting: Family Medicine

## 2016-09-24 ENCOUNTER — Telehealth: Payer: Self-pay | Admitting: *Deleted

## 2016-09-24 MED ORDER — ESCITALOPRAM OXALATE 20 MG PO TABS: ORAL_TABLET | ORAL | 0 refills | Status: DC

## 2016-09-24 NOTE — Telephone Encounter (Signed)
done

## 2016-10-05 ENCOUNTER — Other Ambulatory Visit: Payer: Self-pay | Admitting: Family Medicine

## 2016-10-12 ENCOUNTER — Ambulatory Visit: Payer: 59 | Admitting: Family Medicine

## 2016-10-15 ENCOUNTER — Encounter: Payer: Self-pay | Admitting: Family Medicine

## 2016-10-15 ENCOUNTER — Ambulatory Visit (INDEPENDENT_AMBULATORY_CARE_PROVIDER_SITE_OTHER): Payer: 59 | Admitting: Family Medicine

## 2016-10-15 VITALS — BP 133/85 | HR 66 | Temp 97.5°F | Ht 66.0 in | Wt 178.0 lb

## 2016-10-15 DIAGNOSIS — E559 Vitamin D deficiency, unspecified: Secondary | ICD-10-CM

## 2016-10-15 DIAGNOSIS — G43809 Other migraine, not intractable, without status migrainosus: Secondary | ICD-10-CM

## 2016-10-15 DIAGNOSIS — M8580 Other specified disorders of bone density and structure, unspecified site: Secondary | ICD-10-CM

## 2016-10-15 DIAGNOSIS — R3129 Other microscopic hematuria: Secondary | ICD-10-CM

## 2016-10-15 DIAGNOSIS — Z0000 Encounter for general adult medical examination without abnormal findings: Secondary | ICD-10-CM

## 2016-10-15 MED ORDER — ERENUMAB-AOOE 70 MG/ML ~~LOC~~ SOAJ: 70.0000 mg | SUBCUTANEOUS | 11 refills | Status: DC

## 2016-10-15 NOTE — Progress Notes (Signed)
   HPI  Patient presents today for an annual physical exam.  Patient has Pap smears with GYN. She also has osteopenia and her gynecologist watches her DEXA scans.  She denies any complaints today except for severe migraine headaches. She isn't interested in [place]. She has tried TCAs, beta blockers, CCB, Topamax, Effexor, and Cymbalta. She's also tried Depakote. She has more than 15 headaches a month. She does have a reasonably good response trip tans.  She has osteopenia, takes vitamin D daily.  No blood in the urine observed, previously with 3-10 RBC per high-power field.  PMH: Smoking status noted ROS: Per HPI  Objective: BP 133/85   Pulse 66   Temp (!) 97.5 F (36.4 C) (Oral)   Ht '5\' 6"'$  (1.676 m)   Wt 178 lb (80.7 kg)   BMI 28.73 kg/m  Gen: NAD, alert, cooperative with exam HEENT: NCAT, EOMI, PERRL CV: RRR, good S1/S2, no murmur Resp: CTABL, no wheezes, non-labored Abd: SNTND, BS present, no guarding or organomegaly Ext: No edema, warm Neuro: Alert and oriented, No gross deficits  Assessment and plan:  # Annual physical exam Normal exam, healthy weight, slightly overweight Annual labs arranged for.  # Migraine headaches. Has failed multiple medications for prophylaxis, she has more than 14 headaches a month. Headache prophylactic medications tried and failed include Depakote, beta blockers, calcium channel blockers, TCAs, SNRIs, topamax Trial of aimovig  # microscopic hematuria Repeat UA, if +, work up or refer to uro  # osteopenia, vitamin D Checking level, dexa per GYN   Orders Placed This Encounter  Procedures  . CMP14+EGFR  . CBC with Differential/Platelet  . VITAMIN D 25 Hydroxy (Vit-D Deficiency, Fractures)  . Lipid panel  . TSH  . Urinalysis, Complete    Meds ordered this encounter  Medications  . Erenumab-aooe (AIMOVIG) 70 MG/ML SOAJ    Sig: Inject 70 mg into the skin every 30 (thirty) days.    Dispense:  1 mL    Refill:  [place], MD [facility] 10/15/2016, 12:41 PM

## 2016-10-15 NOTE — Patient Instructions (Signed)
Great to see you!  Leave a urine sample when it is convenient for you.   I have sent Aimovig to the pharmacy, check on the coverage. I can fill out a PA if necessary  Come back in 2-3 months in starting aimovig  Please have fasting labs collected

## 2016-10-18 ENCOUNTER — Other Ambulatory Visit: Payer: 59

## 2016-10-18 DIAGNOSIS — Z0000 Encounter for general adult medical examination without abnormal findings: Secondary | ICD-10-CM | POA: Diagnosis not present

## 2016-10-18 DIAGNOSIS — M8580 Other specified disorders of bone density and structure, unspecified site: Secondary | ICD-10-CM | POA: Diagnosis not present

## 2016-10-18 DIAGNOSIS — E559 Vitamin D deficiency, unspecified: Secondary | ICD-10-CM | POA: Diagnosis not present

## 2016-10-18 DIAGNOSIS — R3129 Other microscopic hematuria: Secondary | ICD-10-CM | POA: Diagnosis not present

## 2016-10-18 LAB — MICROSCOPIC EXAMINATION: Renal Epithel, UA: NONE SEEN /hpf

## 2016-10-18 LAB — URINALYSIS, COMPLETE
BILIRUBIN UA: NEGATIVE
GLUCOSE, UA: NEGATIVE
KETONES UA: NEGATIVE
NITRITE UA: NEGATIVE
Protein, UA: NEGATIVE
UUROB: 0.2 mg/dL (ref 0.2–1.0)
pH, UA: 6.5 (ref 5.0–7.5)

## 2016-10-20 LAB — CBC WITH DIFFERENTIAL/PLATELET
BASOS ABS: 0 10*3/uL (ref 0.0–0.2)
Basos: 1 %
EOS (ABSOLUTE): 0.2 10*3/uL (ref 0.0–0.4)
EOS: 3 %
HEMATOCRIT: 37.5 % (ref 34.0–46.6)
Hemoglobin: 12.8 g/dL (ref 11.1–15.9)
Immature Grans (Abs): 0 10*3/uL (ref 0.0–0.1)
Immature Granulocytes: 0 %
LYMPHS ABS: 1.7 10*3/uL (ref 0.7–3.1)
Lymphs: 28 %
MCH: 31.8 pg (ref 26.6–33.0)
MCHC: 34.1 g/dL (ref 31.5–35.7)
MCV: 93 fL (ref 79–97)
MONOS ABS: 0.5 10*3/uL (ref 0.1–0.9)
Monocytes: 9 %
Neutrophils Absolute: 3.7 10*3/uL (ref 1.4–7.0)
Neutrophils: 59 %
Platelets: 272 10*3/uL (ref 150–379)
RBC: 4.03 x10E6/uL (ref 3.77–5.28)
RDW: 13.5 % (ref 12.3–15.4)
WBC: 6.2 10*3/uL (ref 3.4–10.8)

## 2016-10-20 LAB — CMP14+EGFR
A/G RATIO: 2 (ref 1.2–2.2)
ALBUMIN: 4.4 g/dL (ref 3.6–4.8)
ALK PHOS: 117 IU/L (ref 39–117)
ALT: 12 IU/L (ref 0–32)
AST: 16 IU/L (ref 0–40)
BILIRUBIN TOTAL: 0.4 mg/dL (ref 0.0–1.2)
BUN / CREAT RATIO: 19 (ref 12–28)
BUN: 17 mg/dL (ref 8–27)
CHLORIDE: 104 mmol/L (ref 96–106)
CO2: 25 mmol/L (ref 20–29)
Calcium: 9.2 mg/dL (ref 8.7–10.3)
Creatinine, Ser: 0.9 mg/dL (ref 0.57–1.00)
GFR calc Af Amer: 80 mL/min/{1.73_m2} (ref >59)
GFR calc non Af Amer: 70 mL/min/{1.73_m2} (ref >59)
GLOBULIN, TOTAL: 2.2 g/dL (ref 1.5–4.5)
Glucose: 95 mg/dL (ref 65–99)
Potassium: 4.2 mmol/L (ref 3.5–5.2)
SODIUM: 144 mmol/L (ref 134–144)
Total Protein: 6.6 g/dL (ref 6.0–8.5)

## 2016-10-20 LAB — LIPID PANEL
CHOL/HDL RATIO: 2.4 ratio (ref 0.0–4.4)
CHOLESTEROL TOTAL: 167 mg/dL (ref 100–199)
HDL: 69 mg/dL (ref >39)
LDL CALC: 86 mg/dL (ref 0–99)
Triglycerides: 59 mg/dL (ref 0–149)
VLDL Cholesterol Cal: 12 mg/dL (ref 5–40)

## 2016-10-20 LAB — TSH: TSH: 2.85 u[IU]/mL (ref 0.450–4.500)

## 2016-10-20 LAB — VITAMIN D 25 HYDROXY (VIT D DEFICIENCY, FRACTURES): Vit D, 25-Hydroxy: 30.3 ng/mL (ref 30.0–100.0)

## 2016-10-21 ENCOUNTER — Ambulatory Visit (INDEPENDENT_AMBULATORY_CARE_PROVIDER_SITE_OTHER): Payer: 59

## 2016-10-21 ENCOUNTER — Other Ambulatory Visit: Payer: Self-pay | Admitting: Family Medicine

## 2016-10-21 DIAGNOSIS — R3129 Other microscopic hematuria: Secondary | ICD-10-CM

## 2016-11-02 DIAGNOSIS — R828 Abnormal findings on cytological and histological examination of urine: Secondary | ICD-10-CM | POA: Diagnosis not present

## 2016-11-02 DIAGNOSIS — R3129 Other microscopic hematuria: Secondary | ICD-10-CM | POA: Diagnosis not present

## 2016-11-04 DIAGNOSIS — R3129 Other microscopic hematuria: Secondary | ICD-10-CM | POA: Diagnosis not present

## 2016-11-10 DIAGNOSIS — H2513 Age-related nuclear cataract, bilateral: Secondary | ICD-10-CM | POA: Diagnosis not present

## 2016-11-10 DIAGNOSIS — H5203 Hypermetropia, bilateral: Secondary | ICD-10-CM | POA: Diagnosis not present

## 2016-11-10 DIAGNOSIS — H524 Presbyopia: Secondary | ICD-10-CM | POA: Diagnosis not present

## 2016-11-10 DIAGNOSIS — H25043 Posterior subcapsular polar age-related cataract, bilateral: Secondary | ICD-10-CM | POA: Diagnosis not present

## 2016-11-22 ENCOUNTER — Telehealth: Payer: Self-pay | Admitting: *Deleted

## 2016-11-22 NOTE — Telephone Encounter (Signed)
Yes go ahead and call in her medicine for her.

## 2016-11-22 NOTE — Telephone Encounter (Signed)
Phoned med in - pt aware

## 2016-11-22 NOTE — Telephone Encounter (Signed)
Patient is out of town and forgot her Lexapro. Wants to know if we can call her # 5 pills. She will be out of town until Friday. Call to [facility] pharmacy in [place]

## 2016-12-01 ENCOUNTER — Ambulatory Visit (INDEPENDENT_AMBULATORY_CARE_PROVIDER_SITE_OTHER): Payer: 59 | Admitting: *Deleted

## 2016-12-01 DIAGNOSIS — Z23 Encounter for immunization: Secondary | ICD-10-CM

## 2016-12-01 NOTE — Progress Notes (Signed)
Pt given shingrix vaccine Tolerated well 

## 2016-12-09 DIAGNOSIS — R3129 Other microscopic hematuria: Secondary | ICD-10-CM | POA: Diagnosis not present

## 2016-12-20 ENCOUNTER — Ambulatory Visit (INDEPENDENT_AMBULATORY_CARE_PROVIDER_SITE_OTHER): Payer: 59 | Admitting: Gynecology

## 2016-12-20 ENCOUNTER — Encounter: Payer: Self-pay | Admitting: Gynecology

## 2016-12-20 VITALS — BP 120/78 | Ht 66.0 in | Wt 178.0 lb

## 2016-12-20 DIAGNOSIS — N952 Postmenopausal atrophic vaginitis: Secondary | ICD-10-CM

## 2016-12-20 DIAGNOSIS — Z01411 Encounter for gynecological examination (general) (routine) with abnormal findings: Secondary | ICD-10-CM | POA: Diagnosis not present

## 2016-12-20 NOTE — Patient Instructions (Signed)
Followup in one year for annual exam, sooner if any issues 

## 2016-12-20 NOTE — Progress Notes (Signed)
    Carolyn Cooper [birth year] [id number]        61 y.o.  G4P4 for annual gynecologic exam.    Past medical history,surgical history, problem list, medications, allergies, family history and social history were all reviewed and documented as reviewed in the EPIC chart.  ROS:  Performed with pertinent positives and negatives included in the history, assessment and plan.   Additional significant findings :  None   Exam: Carolyn Cooper assistant Vitals:   12/20/16 1603  BP: 120/78  Weight: 178 lb (80.7 kg)  Height: 5\' 6"  (1.676 m)   Body mass index is 28.73 kg/m.  General appearance:  Normal affect, orientation and appearance. Skin: Grossly normal HEENT: Without gross lesions.  No cervical or supraclavicular adenopathy. Thyroid normal.  Lungs:  Clear without wheezing, rales or rhonchi Cardiac: RR, without RMG Abdominal:  Soft, nontender, without masses, guarding, rebound, organomegaly or hernia Breasts:  Examined lying and sitting without masses, retractions, discharge or axillary adenopathy. Pelvic:  Ext, BUS, Vagina: Normal with atrophic changes  Cervix: Normal with atrophic changes  Uterus: Anteverted, normal size, shape and contour, midline and mobile nontender   Adnexa: Without masses or tenderness    Anus and perineum: Normal excepting external hemorrhoids  Rectovaginal: Normal sphincter tone without palpated masses or tenderness.    Assessment/Plan:  61 y.o. G37P4 female for annual gynecologic exam.  1. Postmenopausal/atrophic genital changes. Doing well without significant hot flushes, night sweats, vaginal dryness or any vaginal bleeding. Continue to monitor report any issues or bleeding. 2. Mammography scheduled in December and patients going to follow up for this. Breast exam normal today. 3. DEXA 2015 normal. Prior DEXA 2012 T score -1.3. Plan follow up DEXA at 5 year interval. Increased calcium vitamin D. 4. Pap smear/HPV 2015. No Pap smear done today. History of cone  biopsy 1996 with normal Pap smears afterwards. Plan repeat Pap smear at 5 year interval. 5. Colonoscopy 2010. Reported repeat interval 10 years. 6. Recent workup for microscopic hematuria through urology all negative. 7. Health maintenance. No routine lab work done as patient does this elsewhere. Follow up in one year, sooner as needed.   Carolyn Auerbach MD, 4:22 PM 12/20/2016

## 2016-12-22 ENCOUNTER — Other Ambulatory Visit: Payer: Self-pay | Admitting: Family Medicine

## 2016-12-30 ENCOUNTER — Encounter: Payer: Self-pay | Admitting: Family Medicine

## 2016-12-30 ENCOUNTER — Ambulatory Visit (INDEPENDENT_AMBULATORY_CARE_PROVIDER_SITE_OTHER): Payer: 59 | Admitting: Family Medicine

## 2016-12-30 VITALS — BP 120/80 | HR 79 | Temp 99.5°F | Ht 66.0 in | Wt 180.0 lb

## 2016-12-30 DIAGNOSIS — M7061 Trochanteric bursitis, right hip: Secondary | ICD-10-CM

## 2016-12-30 MED ORDER — TRIAMCINOLONE ACETONIDE 40 MG/ML IJ SUSP: 40.0000 mg | Freq: Once | INTRAMUSCULAR | Status: DC

## 2016-12-30 NOTE — Progress Notes (Signed)
   HPI  Patient presents today here with bilateral hip pain.  Patient states that she had similar hip pain previously, she was fully evaluated at that time and MRI revealed only greater trochanteric bursitis  She had an injection in the right greater trochanteric bursa and had improvement of bilateral hip pain at that time.  She states that the pain is been slowly worsening over the last few months, she is now planning a trip to [place] over the weekend and plans to do a lot of walking. She would like to treat the bursitis before she goes on her trip.  PMH: Smoking status noted ROS: Per HPI  Objective: BP 120/80 (BP Location: Left Arm)   Pulse 79   Temp 99.5 F (37.5 C) (Oral)   Ht 5\' 6"  (1.676 m)   Wt 180 lb (81.6 kg)   BMI 29.05 kg/m  Gen: NAD, alert, cooperative with exam HEENT: NCAT CV: RRR, good S1/S2, no murmur Resp: CTABL, no wheezes, non-labored Ext: No edema, warm Neuro: Alert and oriented, No gross deficits MSK Negative logroll bilaterally, tenderness to palpation over the bilateral greater trochanters.  Bursa injection Area was cleaned with Betadine prep 2 and wife clear with alcohol, using a 22-gauge needle 3 mL's of 1% lidocaine +1 mL of 40 mg/mL of Kenalog were injected over the right greater trochanter. A sterile bandage was placed and the patient tolerated the procedure well.  cahperone present- Lynnea Ferrier   Assessment and plan:  # Greater trochanteric bursitis, BL but R>L Treated with bursal injection as above Discussed supportive care Return to clinic as needed    [name], MD [facility] 12/30/2016, 4:30 PM

## 2017-01-10 ENCOUNTER — Telehealth: Payer: Self-pay | Admitting: Family Medicine

## 2017-01-10 NOTE — Telephone Encounter (Signed)
Called and spoke with pharmacy so patient could receive free trial

## 2017-01-21 ENCOUNTER — Telehealth: Payer: Self-pay | Admitting: *Deleted

## 2017-01-21 MED ORDER — ESCITALOPRAM OXALATE 20 MG PO TABS: 20.0000 mg | ORAL_TABLET | Freq: Every day | ORAL | 0 refills | Status: DC

## 2017-01-21 NOTE — Telephone Encounter (Signed)
Pt aware refill sent to pharmacy 

## 2017-01-26 DIAGNOSIS — H25043 Posterior subcapsular polar age-related cataract, bilateral: Secondary | ICD-10-CM | POA: Diagnosis not present

## 2017-01-26 DIAGNOSIS — H2513 Age-related nuclear cataract, bilateral: Secondary | ICD-10-CM | POA: Diagnosis not present

## 2017-02-02 DIAGNOSIS — H2511 Age-related nuclear cataract, right eye: Secondary | ICD-10-CM | POA: Diagnosis not present

## 2017-02-02 DIAGNOSIS — H2512 Age-related nuclear cataract, left eye: Secondary | ICD-10-CM | POA: Diagnosis not present

## 2017-02-02 DIAGNOSIS — H21561 Pupillary abnormality, right eye: Secondary | ICD-10-CM | POA: Diagnosis not present

## 2017-02-16 DIAGNOSIS — H2511 Age-related nuclear cataract, right eye: Secondary | ICD-10-CM | POA: Diagnosis not present

## 2017-03-16 ENCOUNTER — Telehealth: Payer: Self-pay

## 2017-03-16 ENCOUNTER — Other Ambulatory Visit: Payer: Self-pay | Admitting: Family Medicine

## 2017-03-16 NOTE — Telephone Encounter (Signed)
Patient states she did the two months of the free trail of Amvoig and now would like a rx sent to [facility] in [place]. Please sign if approved.

## 2017-03-17 MED ORDER — ERENUMAB-AOOE 70 MG/ML ~~LOC~~ SOAJ: 70.0000 mg | SUBCUTANEOUS | 11 refills | Status: DC

## 2017-03-17 NOTE — Telephone Encounter (Signed)
Rx written  [name], MD [facility] 03/17/2017, 7:52 AM

## 2017-03-17 NOTE — Telephone Encounter (Signed)
Patient aware.

## 2017-03-17 NOTE — Addendum Note (Signed)
Addended by: Timmothy Euler on: 03/17/2017 07:53 AM   Modules accepted: Orders

## 2017-04-05 DIAGNOSIS — M8580 Other specified disorders of bone density and structure, unspecified site: Secondary | ICD-10-CM

## 2017-04-05 HISTORY — DX: Other specified disorders of bone density and structure, unspecified site: M85.80

## 2017-04-11 DIAGNOSIS — Z1231 Encounter for screening mammogram for malignant neoplasm of breast: Secondary | ICD-10-CM | POA: Diagnosis not present

## 2017-04-21 ENCOUNTER — Other Ambulatory Visit: Payer: Self-pay | Admitting: *Deleted

## 2017-04-21 MED ORDER — ESCITALOPRAM OXALATE 20 MG PO TABS: 20.0000 mg | ORAL_TABLET | Freq: Every day | ORAL | 0 refills | Status: DC

## 2017-04-21 NOTE — Telephone Encounter (Signed)
Next Ov 04/25/17

## 2017-04-25 ENCOUNTER — Encounter: Payer: Self-pay | Admitting: Family Medicine

## 2017-04-25 ENCOUNTER — Ambulatory Visit (INDEPENDENT_AMBULATORY_CARE_PROVIDER_SITE_OTHER): Payer: 59 | Admitting: Family Medicine

## 2017-04-25 VITALS — BP 127/78 | HR 84 | Temp 98.0°F | Ht 66.0 in | Wt 174.0 lb

## 2017-04-25 DIAGNOSIS — M7061 Trochanteric bursitis, right hip: Secondary | ICD-10-CM

## 2017-04-25 DIAGNOSIS — M25562 Pain in left knee: Secondary | ICD-10-CM

## 2017-04-25 DIAGNOSIS — G43809 Other migraine, not intractable, without status migrainosus: Secondary | ICD-10-CM

## 2017-04-25 DIAGNOSIS — M7062 Trochanteric bursitis, left hip: Secondary | ICD-10-CM

## 2017-04-25 MED ORDER — MELOXICAM 15 MG PO TABS: 15.0000 mg | ORAL_TABLET | Freq: Every day | ORAL | 0 refills | Status: DC

## 2017-04-25 NOTE — Patient Instructions (Signed)
Great to see you!  Try meloxicam once daily for at least 3 weeks.  Try ice 15 minutes to each hip 3 times daily. Please let me know if the knee continues to give you a problem, we could consider an injection.  Usually people see improvement with aimovig within 3 months.

## 2017-04-25 NOTE — Progress Notes (Signed)
   HPI  Patient presents today here with bilateral hip pain, left knee pain, and migraine headaches.  Patient started aimovig 3 months ago, she states that her headaches seem worse. She is concerned that she may be should stop the medicine.  Bilateral hip pain Ongoing, with no injury. MRI previously showed greater trochanteric bursitis bilaterally, she was treated with bursal injection of Kenalog with very good improvement. Her last injection was September 2018.  Knee pain 1 month history of sharp knee pain occasionally with standing up, states that it is very severe. Also has pain with getting down on her knees. No injury. Has a history of a torn meniscus she believes on the right knee   PMH: Smoking status noted ROS: Per HPI  Objective: BP 127/78   Pulse 84   Temp 98 F (36.7 C) (Oral)   Ht 5\' 6"  (1.676 m)   Wt 174 lb (78.9 kg)   BMI 28.08 kg/m  Gen: NAD, alert, cooperative with exam HEENT: NCAT CV: RRR, good S1/S2 Resp: CTABL, no wheezes, non-labored Ext: No edema, warm Neuro: Alert and oriented, No gross deficits  MSK: L knee without erythema, effusion, bruising, or gross deformity Mild medial joint line tenderness.  ligamentously intact to Lachman's and with varus and valgus stress.  Negative McMurray's test   MRI Knee 03/2010 IMPRESSION: Incomplete radial tear of the medial meniscus posterior horn root with mild adjacent bone marrow edema in the tibial plateau.  No extrusion.  Mild patellofemoral osteoarthritis.  Assessment and plan:  #Left knee pain Likely flare of meniscal injury. Offered injection, however she would like to wait. Scheduled NSAIDs as below  #Bilateral greater trochanteric bursitis Recommend scheduled NSAIDs, ice 3 times daily for about 2-3 weeks. Meloxicam was the chosen NSAIDs   # Migraines Pt has tried Aimovig for 3 months now with no improvement.  She is considering stopping, this is reasonable, could try another simlar mAB  for Migraines.   Meds ordered this encounter  Medications  . meloxicam (MOBIC) 15 MG tablet    Sig: Take 1 tablet (15 mg total) by mouth daily.    Dispense:  30 tablet    Refill:  0    [name], MD [facility] 04/25/2017, 5:10 PM

## 2017-04-28 DIAGNOSIS — H43813 Vitreous degeneration, bilateral: Secondary | ICD-10-CM | POA: Diagnosis not present

## 2017-04-28 DIAGNOSIS — H35371 Puckering of macula, right eye: Secondary | ICD-10-CM | POA: Diagnosis not present

## 2017-05-02 ENCOUNTER — Telehealth: Payer: Self-pay

## 2017-05-02 MED ORDER — FREMANEZUMAB-VFRM 225 MG/1.5ML ~~LOC~~ SOSY: 225.0000 mg | PREFILLED_SYRINGE | SUBCUTANEOUS | 11 refills | Status: DC

## 2017-05-02 NOTE — Telephone Encounter (Signed)
Discussed wth pt. No mprovement with aimovig in 3 months.   Dc aimovig, trial of ajovy.   Has previously tried BB, depakote, topamax.   [name], MD [facility] 05/02/2017, 10:46 AM

## 2017-05-02 NOTE — Addendum Note (Signed)
Addended by: Timmothy Euler on: 05/02/2017 10:47 AM   Modules accepted: Orders

## 2017-05-02 NOTE — Telephone Encounter (Signed)
Wanting to know if she still needs to take her monthly Amovig shot? Patient states it has not worked the last three months. She is due for her shot now. Please review and advise.

## 2017-05-09 ENCOUNTER — Other Ambulatory Visit: Payer: Self-pay | Admitting: Physician Assistant

## 2017-05-19 ENCOUNTER — Other Ambulatory Visit: Payer: Self-pay | Admitting: *Deleted

## 2017-05-19 MED ORDER — MELOXICAM 15 MG PO TABS: 15.0000 mg | ORAL_TABLET | Freq: Every day | ORAL | 5 refills | Status: DC

## 2017-05-24 ENCOUNTER — Other Ambulatory Visit: Payer: Self-pay | Admitting: Family Medicine

## 2017-05-24 ENCOUNTER — Other Ambulatory Visit (INDEPENDENT_AMBULATORY_CARE_PROVIDER_SITE_OTHER): Payer: 59

## 2017-05-24 DIAGNOSIS — Z13820 Encounter for screening for osteoporosis: Secondary | ICD-10-CM | POA: Diagnosis not present

## 2017-05-24 DIAGNOSIS — Z780 Asymptomatic menopausal state: Secondary | ICD-10-CM | POA: Diagnosis not present

## 2017-07-08 ENCOUNTER — Ambulatory Visit (INDEPENDENT_AMBULATORY_CARE_PROVIDER_SITE_OTHER): Payer: 59 | Admitting: *Deleted

## 2017-07-08 DIAGNOSIS — Z23 Encounter for immunization: Secondary | ICD-10-CM | POA: Diagnosis not present

## 2017-07-08 NOTE — Progress Notes (Signed)
Pt given Shingrix vaccine 2nd dose Tolerated well

## 2017-07-11 ENCOUNTER — Ambulatory Visit: Payer: 59

## 2017-07-17 ENCOUNTER — Other Ambulatory Visit: Payer: Self-pay | Admitting: Family Medicine

## 2017-07-25 ENCOUNTER — Ambulatory Visit (INDEPENDENT_AMBULATORY_CARE_PROVIDER_SITE_OTHER): Payer: 59 | Admitting: Family Medicine

## 2017-07-25 ENCOUNTER — Encounter: Payer: Self-pay | Admitting: Family Medicine

## 2017-07-25 VITALS — BP 132/87 | HR 71 | Temp 98.2°F | Ht 66.0 in | Wt 175.0 lb

## 2017-07-25 DIAGNOSIS — J029 Acute pharyngitis, unspecified: Secondary | ICD-10-CM | POA: Diagnosis not present

## 2017-07-25 LAB — CULTURE, GROUP A STREP

## 2017-07-25 LAB — RAPID STREP SCREEN (MED CTR MEBANE ONLY): Strep Gp A Ag, IA W/Reflex: NEGATIVE

## 2017-07-25 MED ORDER — AMOXICILLIN 500 MG PO CAPS: 500.0000 mg | ORAL_CAPSULE | Freq: Two times a day (BID) | ORAL | 0 refills | Status: DC

## 2017-07-25 NOTE — Progress Notes (Signed)
   HPI  Patient presents today for sore throat.  Patient rarely has sore throats.  Patient took care of her grandson who had rhinorrhea over the weekend.  Last night she developed severe sore throat, pain with swallowing.  No fever, chills, sweats  She is tolerating fluids normal  PMH: Smoking status noted ROS: Per HPI  Objective: BP 132/87   Pulse 71   Temp 98.2 F (36.8 C) (Oral)   Ht 5\' 6"  (1.676 m)   Wt 175 lb (79.4 kg)   BMI 28.25 kg/m  Gen: NAD, alert, cooperative with exam HEENT: NCAT, tonsils surgically absent, oropharynx moist and clear CV: RRR, good S1/S2, no murmur Resp: CTABL, no wheezes, non-labored Ext: No edema, warm Neuro: Alert and oriented, No gross deficits  Assessment and plan:  #Sore throat Treat with amoxicillin, low risk for strep pharyngitis, however she rarely has sore throats and she had a sick contact. Rapid strep negative, culture pending. Supportive care     Orders Placed This Encounter  Procedures  . Culture, Group A Strep    Order Specific Question:   Source    Answer:   throat  . Rapid Strep Screen ([facility] ONLY)    Meds ordered this encounter  Medications  . amoxicillin (AMOXIL) 500 MG capsule    Sig: Take 1 capsule (500 mg total) by mouth 2 (two) times daily.    Dispense:  20 capsule    Refill:  [place], MD [facility] 07/25/2017, 8:26 AM

## 2017-07-28 LAB — CULTURE, GROUP A STREP: STREP A CULTURE: NEGATIVE

## 2017-08-17 ENCOUNTER — Other Ambulatory Visit: Payer: Self-pay

## 2017-08-17 DIAGNOSIS — Z1211 Encounter for screening for malignant neoplasm of colon: Secondary | ICD-10-CM

## 2017-08-18 ENCOUNTER — Encounter: Payer: Self-pay | Admitting: Gastroenterology

## 2017-08-26 ENCOUNTER — Other Ambulatory Visit: Payer: Self-pay | Admitting: Family Medicine

## 2017-09-06 ENCOUNTER — Encounter: Payer: Self-pay | Admitting: Family Medicine

## 2017-09-06 ENCOUNTER — Ambulatory Visit (INDEPENDENT_AMBULATORY_CARE_PROVIDER_SITE_OTHER): Payer: 59 | Admitting: Family Medicine

## 2017-09-06 VITALS — BP 130/79 | HR 75 | Temp 97.4°F | Ht 66.0 in | Wt 175.0 lb

## 2017-09-06 DIAGNOSIS — G43809 Other migraine, not intractable, without status migrainosus: Secondary | ICD-10-CM

## 2017-09-06 NOTE — Progress Notes (Signed)
   HPI  Patient presents today discussed migraine headaches.  Patient has recently started using CBD oil, she went from having 20-25 headaches a month to having 0 headaches.  She has not had a single headache since starting CBD oil.  She denies any side effects. She is very satisfied with the relief.  PMH: Smoking status noted ROS: Per HPI  Objective: BP 130/79   Pulse 75   Temp (!) 97.4 F (36.3 C) (Oral)   Ht 5\' 6"  (1.676 m)   Wt 175 lb (79.4 kg)   BMI 28.25 kg/m  Gen: NAD, alert, cooperative with exam HEENT: NCAT CV: RRR, good S1/S2, no murmur Resp: CTABL, no wheezes, non-labored Ext: No edema, warm Neuro: Alert and oriented, No gross deficits  Assessment and plan:  #Migraine headaches Well-controlled now on CBD oil, pt has gone from using 20 + doses of triptans a month to none. Very good relief No changes   [name], MD [facility] 09/06/2017, 3:36 PM

## 2017-10-10 ENCOUNTER — Ambulatory Visit (AMBULATORY_SURGERY_CENTER): Payer: Self-pay | Admitting: *Deleted

## 2017-10-10 VITALS — Ht 65.5 in | Wt 178.0 lb

## 2017-10-10 DIAGNOSIS — Z1211 Encounter for screening for malignant neoplasm of colon: Secondary | ICD-10-CM

## 2017-10-10 MED ORDER — NA SULFATE-K SULFATE-MG SULF 17.5-3.13-1.6 GM/177ML PO SOLN: 1.0000 | Freq: Once | ORAL | 0 refills | Status: AC

## 2017-10-10 NOTE — Progress Notes (Signed)
Patient denies any allergies to eggs or soy. Patient denies any problems with anesthesia/sedation. Patient denies any oxygen use at home. Patient denies taking any diet/weight loss medications or blood thinners. EMMI education assisgned to patient on colonoscopy, this was explained and instructions given to patient. Suprep coupon printed and given to pt.

## 2017-10-14 ENCOUNTER — Encounter: Payer: Self-pay | Admitting: Gastroenterology

## 2017-10-17 ENCOUNTER — Ambulatory Visit (INDEPENDENT_AMBULATORY_CARE_PROVIDER_SITE_OTHER): Payer: 59 | Admitting: Physician Assistant

## 2017-10-17 ENCOUNTER — Encounter: Payer: Self-pay | Admitting: Physician Assistant

## 2017-10-17 VITALS — BP 130/72 | HR 71 | Temp 97.8°F | Ht 65.5 in | Wt 178.0 lb

## 2017-10-17 DIAGNOSIS — F419 Anxiety disorder, unspecified: Secondary | ICD-10-CM | POA: Diagnosis not present

## 2017-10-17 DIAGNOSIS — R635 Abnormal weight gain: Secondary | ICD-10-CM | POA: Insufficient documentation

## 2017-10-17 MED ORDER — BUSPIRONE HCL 10 MG PO TABS: 10.0000 mg | ORAL_TABLET | Freq: Three times a day (TID) | ORAL | 1 refills | Status: DC

## 2017-10-17 MED ORDER — PHENTERMINE HCL 37.5 MG PO CAPS: 37.5000 mg | ORAL_CAPSULE | ORAL | 2 refills | Status: DC

## 2017-10-17 NOTE — Progress Notes (Signed)
BP 130/72   Pulse 71   Temp 97.8 F (36.6 C) (Oral)   Ht 5' 5.5" (1.664 m)   Wt 178 lb (80.7 kg)   BMI 29.17 kg/m    Subjective:    Patient ID: Carolyn Cooper, female    DOB: 1955/09/07, 62 y.o.   MRN: [id number]  HPI: Carolyn Cooper is a 62 y.o. female presenting on 10/17/2017 for Anxiety and Discuss weight  This patient comes in for periodic recheck on medications and conditions including anxiety that is persistently aggravated at this time due to family issues and addictions. She is attending counseling with her husband to try and help each other get through this time.   She also has been stressed enough to gain more weight this year. She has taken phentermine and tolerated it well.    All medications are reviewed today. There are no reports of any problems with the medications. All of the medical conditions are reviewed and updated.  Lab work is reviewed and will be ordered as medically necessary. There are no new problems reported with today's visit.   Past Medical History:  Diagnosis Date  . Depression   . IBS (irritable bowel syndrome)   . Migraines   . Osteopenia    2012 T score - 1.3, 2015 T score normal   Relevant past medical, surgical, family and social history reviewed and updated as indicated. Interim medical history since our last visit reviewed. Allergies and medications reviewed and updated. DATA REVIEWED: CHART IN EPIC  Family History reviewed for pertinent findings.  Review of Systems  Constitutional: Positive for unexpected weight change.  HENT: Negative.   Eyes: Negative.   Respiratory: Negative.   Gastrointestinal: Negative.   Genitourinary: Negative.   Psychiatric/Behavioral: Positive for agitation and dysphoric mood. The patient is nervous/anxious.     Allergies as of 10/17/2017      Reactions   Sulfa Antibiotics Hives      Medication List        Accurate as of 10/17/17 11:10 PM. Always use your most recent med list.          busPIRone 10  MG tablet Commonly known as:  BUSPAR Take 1 tablet (10 mg total) by mouth 3 (three) times daily.   calcium carbonate 600 MG Tabs tablet Commonly known as:  OS-CAL Take 600 mg by mouth 2 (two) times daily with a meal.   escitalopram 20 MG tablet Commonly known as:  LEXAPRO Take 1 tablet (20 mg total) by mouth daily.   meloxicam 15 MG tablet Commonly known as:  MOBIC Take 1 tablet (15 mg total) by mouth daily.   phentermine 37.5 MG capsule Take 1 capsule (37.5 mg total) by mouth every morning.   SUMAtriptan 100 MG tablet Commonly known as:  IMITREX TAKE 1 TAB BY MOUTH EVERY 2 HOURS AS NEEDED MAY REPEAT IN 2 HOURS IF HEADACHE PERSISTS   Vitamin D 400 units capsule Take 800 Units by mouth daily.   zolmitriptan 5 MG tablet Commonly known as:  ZOMIG TAKE 1/2 TO 1 TABLETS AS NEEDED FOR MIGRAINE.MAY REPEAT IN 2 HOURS IF NEEDED.MAX 10MG  DAILY          Objective:    BP 130/72   Pulse 71   Temp 97.8 F (36.6 C) (Oral)   Ht 5' 5.5" (1.664 m)   Wt 178 lb (80.7 kg)   BMI 29.17 kg/m   Allergies  Allergen Reactions  . Sulfa Antibiotics Hives  Wt Readings from Last 3 Encounters:  10/17/17 178 lb (80.7 kg)  10/10/17 178 lb (80.7 kg)  09/06/17 175 lb (79.4 kg)    Physical Exam  Constitutional: She is oriented to person, place, and time. She appears well-developed and well-nourished.  HENT:  Head: Normocephalic and atraumatic.  Eyes: Pupils are equal, round, and reactive to light. Conjunctivae and EOM are normal.  Cardiovascular: Normal rate, regular rhythm, normal heart sounds and intact distal pulses.  Pulmonary/Chest: Effort normal and breath sounds normal.  Abdominal: Soft. Bowel sounds are normal.  Neurological: She is alert and oriented to person, place, and time. She has normal reflexes.  Skin: Skin is warm and dry. No rash noted.  Psychiatric: She has a normal mood and affect. Her behavior is normal. Judgment and thought content normal.    Results for orders  placed or performed in visit on 07/25/17  Culture, Group A Strep  Result Value Ref Range   Strep A Culture Negative   Rapid Strep Screen ([facility] ONLY)  Result Value Ref Range   Strep Gp A Ag, IA W/Reflex Negative Negative  Culture, Group A Strep  Result Value Ref Range   Strep A Culture CANCELED       Assessment & Plan:   1. Anxiety - busPIRone (BUSPAR) 10 MG tablet; Take 1 tablet (10 mg total) by mouth 3 (three) times daily.  Dispense: 60 tablet; Refill: 1  2. Weight gain - phentermine 37.5 MG capsule; Take 1 capsule (37.5 mg total) by mouth every morning.  Dispense: 30 capsule; Refill: 2   Continue all other maintenance medications as listed above.  Follow up plan: No follow-ups on file.  Educational handout given for [place] PA-C [facility] [address]  [place], [place] [address] [phone]   10/17/2017, 11:10 PM

## 2017-10-24 ENCOUNTER — Encounter: Payer: Self-pay | Admitting: Gastroenterology

## 2017-10-24 ENCOUNTER — Ambulatory Visit (AMBULATORY_SURGERY_CENTER): Payer: 59 | Admitting: Gastroenterology

## 2017-10-24 VITALS — BP 121/76 | HR 70 | Temp 98.9°F | Resp 10 | Ht 65.0 in | Wt 178.0 lb

## 2017-10-24 DIAGNOSIS — D122 Benign neoplasm of ascending colon: Secondary | ICD-10-CM | POA: Diagnosis not present

## 2017-10-24 DIAGNOSIS — Z1211 Encounter for screening for malignant neoplasm of colon: Secondary | ICD-10-CM

## 2017-10-24 MED ORDER — SODIUM CHLORIDE 0.9 % IV SOLN: 500.0000 mL | Freq: Once | INTRAVENOUS | Status: DC

## 2017-10-24 NOTE — Progress Notes (Signed)
A and O x3. Report to RN. Tolerated MAC anesthesia well.

## 2017-10-24 NOTE — Progress Notes (Signed)
All history reviewed with patient.

## 2017-10-24 NOTE — Op Note (Addendum)
[facility] Patient Name: Carolyn Cooper Procedure Date: 10/24/2017 1:17 PM MRN: [id number] Endoscopist: Mauri Pole , MD Age: 62 Referring MD:  Date of Birth: February 12, 1956 Gender: Female Account #: [id number] Procedure:                Colonoscopy Indications:              Screening for colorectal malignant neoplasm Medicines:                Monitored Anesthesia Care Procedure:                Pre-Anesthesia Assessment:                           - Prior to the procedure, a History and Physical                            was performed, and patient medications and                            allergies were reviewed. The patient's tolerance of                            previous anesthesia was also reviewed. The risks                            and benefits of the procedure and the sedation                            options and risks were discussed with the patient.                            All questions were answered, and informed consent                            was obtained. Prior Anticoagulants: The patient has                            taken no previous anticoagulant or antiplatelet                            agents. ASA Grade Assessment: II - A patient with                            mild systemic disease. After reviewing the risks                            and benefits, the patient was deemed in                            satisfactory condition to undergo the procedure.                           After obtaining informed consent, the colonoscope  was passed under direct vision. Throughout the                            procedure, the patient's blood pressure, pulse, and                            oxygen saturations were monitored continuously. The                            Colonoscope was introduced through the anus and                            advanced to the the cecum, identified by                            appendiceal orifice and  ileocecal valve. The                            colonoscopy was somewhat difficult due to a                            redundant colon. The patient tolerated the                            procedure well. The quality of the bowel                            preparation was adequate. The ileocecal valve,                            appendiceal orifice, and rectum were photographed. Scope In: 1:20:53 PM Scope Out: 1:43:50 PM Scope Withdrawal Time: 0 hours 15 minutes 41 seconds  Total Procedure Duration: 0 hours 22 minutes 57 seconds  Findings:                 The digital rectal exam findings include decreased                            sphincter tone and internal hemorrhoids that                            prolapse with straining, but require manual                            replacement into the anal canal (Grade III).                            Partial rectal prolapse.                           A 8 mm polyp was found in the ascending colon. The                            polyp was sessile. The polyp was removed with a  cold snare. Resection and retrieval were complete.                           Scattered small-mouthed diverticula were found in                            the sigmoid colon and descending colon.                           Internal external and internal hemorrhoids were                            found during retroflexion. The hemorrhoids were                            large and Grade III (internal hemorrhoids that                            prolapse but require manual reduction). Complications:            No immediate complications. Estimated Blood Loss:     Estimated blood loss was minimal. Impression:               - Decreased sphincter tone and internal hemorrhoids                            that prolapse with straining, but require manual                            replacement into the anal canal (Grade III) found                            on  digital rectal exam.                           - One 8 mm polyp in the ascending colon, removed                            with a cold snare. Resected and retrieved.                           - Diverticulosis in the sigmoid colon and in the                            descending colon.                           - Internal external and internal hemorrhoids. Recommendation:           - Patient has a contact number available for                            emergencies. The signs and symptoms of potential                            delayed  complications were discussed with the                            patient. Return to normal activities tomorrow.                            Written discharge instructions were provided to the                            patient.                           - Resume previous diet.                           - Continue present medications.                           - Await pathology results.                           - Repeat colonoscopy in 5-10 years for surveillance                            based on pathology results.                           - Return to GI clinic PRN.                           - Lactose free diet trial for 1-2 weeks                           - IB Gard 1 capsule upto three times daily PRN                           - Kegel exercises Mauri Pole, MD 10/24/2017 1:48:31 PM This report has been signed electronically.

## 2017-10-24 NOTE — Progress Notes (Signed)
Called to room to assist during endoscopic procedure.  Patient ID and intended procedure confirmed with present staff. Received instructions for my participation in the procedure from the performing physician.  

## 2017-10-24 NOTE — Patient Instructions (Signed)
YOU HAD AN ENDOSCOPIC PROCEDURE TODAY AT [facility]:   Refer to the procedure report that was given to you for any specific questions about what was found during the examination.  If the procedure report does not answer your questions, please call your gastroenterologist to clarify.  If you requested that your care partner not be given the details of your procedure findings, then the procedure report has been included in a sealed envelope for you to review at your convenience later.  YOU SHOULD EXPECT: Some feelings of bloating in the abdomen. Passage of more gas than usual.  Walking can help get rid of the air that was put into your GI tract during the procedure and reduce the bloating. If you had a lower endoscopy (such as a colonoscopy or flexible sigmoidoscopy) you may notice spotting of blood in your stool or on the toilet paper. If you underwent a bowel prep for your procedure, you may not have a normal bowel movement for a few days.  Please Note:  You might notice some irritation and congestion in your nose or some drainage.  This is from the oxygen used during your procedure.  There is no need for concern and it should clear up in a day or so.  SYMPTOMS TO REPORT IMMEDIATELY:   Following lower endoscopy (colonoscopy or flexible sigmoidoscopy):  Excessive amounts of blood in the stool  Significant tenderness or worsening of abdominal pains  Swelling of the abdomen that is new, acute  Fever of 100F or higher  For urgent or emergent issues, a gastroenterologist can be reached at any hour by calling [phone].   DIET:  We do recommend a small meal at first, but then you may proceed to your regular diet.  Drink plenty of fluids but you should avoid alcoholic beverages for 24 hours.  MEDICATION: Continue present medications.  Please see handouts given to you by your recovery nurse.  Follow up in the GI clinic as needed.  ACTIVITY:  You should plan to take it easy  for the rest of today and you should NOT DRIVE or use heavy machinery until tomorrow (because of the sedation medicines used during the test).    FOLLOW UP: Our staff will call the number listed on your records the next business day following your procedure to check on you and address any questions or concerns that you may have regarding the information given to you following your procedure. If we do not reach you, we will leave a message.  However, if you are feeling well and you are not experiencing any problems, there is no need to return our call.  We will assume that you have returned to your regular daily activities without incident.  If any biopsies were taken you will be contacted by phone or by letter within the next 1-3 weeks.  Please call us at [phone] if you have not heard about the biopsies in 3 weeks.   Thank you for allowing [place] to provide for your healthcare needs today.   SIGNATURES/CONFIDENTIALITY: You and/or your care partner have signed paperwork which will be entered into your electronic medical record.  These signatures attest to the fact that that the information above on your After Visit Summary has been reviewed and is understood.  Full responsibility of the confidentiality of this discharge information lies with you and/or your care-partner.

## 2017-10-25 ENCOUNTER — Telehealth: Payer: Self-pay

## 2017-10-25 ENCOUNTER — Other Ambulatory Visit: Payer: Self-pay | Admitting: *Deleted

## 2017-10-25 ENCOUNTER — Encounter: Payer: Self-pay | Admitting: Gastroenterology

## 2017-10-25 MED ORDER — ESCITALOPRAM OXALATE 20 MG PO TABS: 20.0000 mg | ORAL_TABLET | Freq: Every day | ORAL | 0 refills | Status: DC

## 2017-10-25 NOTE — Telephone Encounter (Signed)
  Follow up Call-  Call back number 10/24/2017  Post procedure Call Back phone  # [phone]  Permission to leave phone message Yes  Some recent data might be hidden     Patient questions:  Do you have a fever, pain , or abdominal swelling? No. Pain Score  0 *  Have you tolerated food without any problems? Yes.    Have you been able to return to your normal activities? Yes.    Do you have any questions about your discharge instructions: Diet   No. Medications  No. Follow up visit  No.  Do you have questions or concerns about your Care? No.  Actions: * If pain score is 4 or above: No action needed, pain <4.

## 2017-10-27 ENCOUNTER — Encounter: Payer: Self-pay | Admitting: Gastroenterology

## 2017-12-07 ENCOUNTER — Other Ambulatory Visit: Payer: Self-pay | Admitting: *Deleted

## 2017-12-07 MED ORDER — MELOXICAM 15 MG PO TABS: 15.0000 mg | ORAL_TABLET | Freq: Every day | ORAL | 5 refills | Status: DC

## 2018-01-05 ENCOUNTER — Other Ambulatory Visit: Payer: Self-pay | Admitting: Physician Assistant

## 2018-01-05 ENCOUNTER — Ambulatory Visit (INDEPENDENT_AMBULATORY_CARE_PROVIDER_SITE_OTHER): Payer: 59 | Admitting: Gynecology

## 2018-01-05 ENCOUNTER — Encounter: Payer: Self-pay | Admitting: Gynecology

## 2018-01-05 VITALS — BP 126/80 | Ht 66.0 in | Wt 179.0 lb

## 2018-01-05 DIAGNOSIS — Z01419 Encounter for gynecological examination (general) (routine) without abnormal findings: Secondary | ICD-10-CM | POA: Diagnosis not present

## 2018-01-05 DIAGNOSIS — M8580 Other specified disorders of bone density and structure, unspecified site: Secondary | ICD-10-CM | POA: Diagnosis not present

## 2018-01-05 DIAGNOSIS — N952 Postmenopausal atrophic vaginitis: Secondary | ICD-10-CM | POA: Diagnosis not present

## 2018-01-05 DIAGNOSIS — Z1151 Encounter for screening for human papillomavirus (HPV): Secondary | ICD-10-CM

## 2018-01-05 DIAGNOSIS — D485 Neoplasm of uncertain behavior of skin: Secondary | ICD-10-CM | POA: Diagnosis not present

## 2018-01-05 NOTE — Addendum Note (Signed)
Addended by: Nelva Nay on: 01/05/2018 09:19 AM   Modules accepted: Orders

## 2018-01-05 NOTE — Patient Instructions (Signed)
Follow-up in 1 year for annual exam, sooner if any issues. 

## 2018-01-05 NOTE — Progress Notes (Signed)
    Carolyn Cooper [birth year] [id number]        62 y.o.  G4P4 for annual gynecologic exam.  Doing well without complaints  Past medical history,surgical history, problem list, medications, allergies, family history and social history were all reviewed and documented as reviewed in the EPIC chart.  ROS:  Performed with pertinent positives and negatives included in the history, assessment and plan.   Additional significant findings : None   Exam: [name] assistant Vitals:   01/05/18 0812  BP: 126/80  Weight: 179 lb (81.2 kg)  Height: 5\' 6"  (1.676 m)   Body mass index is 28.89 kg/m.  General appearance:  Normal affect, orientation and appearance. Skin: Grossly normal HEENT: Without gross lesions.  No cervical or supraclavicular adenopathy. Thyroid normal.  Lungs:  Clear without wheezing, rales or rhonchi Cardiac: RR, without RMG Abdominal:  Soft, nontender, without masses, guarding, rebound, organomegaly or hernia Breasts:  Examined lying and sitting without masses, retractions, discharge or axillary adenopathy. Pelvic:  Ext, BUS, Vagina: Normal with atrophic changes  Cervix: With old cone scarring with atrophic changes.  Pap smear/HPV  Uterus: Anteverted, normal size, shape and contour, midline and mobile nontender   Adnexa: Without masses or tenderness    Anus and perineum: Normal   Rectovaginal: Normal sphincter tone without palpated masses or tenderness.    Assessment/Plan:  62 y.o. G38P4 female for annual gynecologic exam.   1. Postmenopausal/atrophic genital changes.  No significant menopausal symptoms or any bleeding. 2. Colonoscopy 2019 3. Mammography 04/2017.  Continue with annual mammography this coming January.  Breast exam normal today. 4. Osteopenia.  DEXA done this year elsewhere T score -1.5 FRAX 8% / 0.7%.  Unable to compared to prior DEXA done at a different facility.  Discussed osteopenia.  Need for weightbearing exercise on a regular basis and adequate  calcium and vitamin D intake reviewed.  Recommend repeat DEXA at 2-year interval. 5. Pap smear/HPV 10/2013.  Pap smear/HPV today.  History of cone biopsy 1996 with normal Pap smears since. 6. Health maintenance.  No routine lab work done as patient plans to do this elsewhere.  Follow-up 1 year, sooner as needed.   Anastasio Auerbach MD, 9:02 AM 01/05/2018

## 2018-01-09 LAB — PAP IG AND HPV HIGH-RISK: HPV DNA High Risk: NOT DETECTED

## 2018-02-28 ENCOUNTER — Encounter: Payer: Self-pay | Admitting: Physician Assistant

## 2018-02-28 ENCOUNTER — Ambulatory Visit (INDEPENDENT_AMBULATORY_CARE_PROVIDER_SITE_OTHER): Payer: 59 | Admitting: Physician Assistant

## 2018-02-28 VITALS — BP 137/81 | HR 71 | Temp 98.2°F | Ht 66.0 in | Wt 180.0 lb

## 2018-02-28 DIAGNOSIS — M7062 Trochanteric bursitis, left hip: Secondary | ICD-10-CM

## 2018-02-28 MED ORDER — PREDNISONE 10 MG PO TABS: 10.0000 mg | ORAL_TABLET | Freq: Every day | ORAL | 1 refills | Status: DC

## 2018-02-28 NOTE — Progress Notes (Signed)
BP 137/81   Pulse 71   Temp 98.2 F (36.8 C) (Oral)   Ht 5\' 6"  (1.676 m)   Wt 180 lb (81.6 kg)   BMI 29.05 kg/m    Subjective:    Patient ID: Carolyn Cooper, female    DOB: 1955/10/04, 62 y.o.   MRN: [id number]  HPI: Carolyn Cooper is a 62 y.o. female presenting on 02/28/2018 for Hip Pain (left)  This patient comes in having a flareup of her left trochanteric bursitis.  She has known disease of both hips seen on MRI.  She will sometimes have a flareup.  She did do a lot of work of few days ago while she was at home.  And she has had continuous pain for a few days  Past Medical History:  Diagnosis Date  . Cataract    had surgery  . Depression   . IBS (irritable bowel syndrome)   . Migraines   . Osteopenia    2012 T score - 1.3, 2015 T score normal   Relevant past medical, surgical, family and social history reviewed and updated as indicated. Interim medical history since our last visit reviewed. Allergies and medications reviewed and updated. DATA REVIEWED: CHART IN EPIC  Family History reviewed for pertinent findings.  Review of Systems  Constitutional: Negative.   HENT: Negative.   Eyes: Negative.   Respiratory: Negative.   Gastrointestinal: Negative.   Genitourinary: Negative.   Musculoskeletal: Positive for arthralgias, back pain and myalgias.    Allergies as of 02/28/2018      Reactions   Sulfa Antibiotics Hives      Medication List        Accurate as of 02/28/18  1:27 PM. Always use your most recent med list.          calcium carbonate 600 MG Tabs tablet Commonly known as:  OS-CAL Take 600 mg by mouth 2 (two) times daily with a meal.   escitalopram 20 MG tablet Commonly known as:  LEXAPRO Take 1 tablet (20 mg total) by mouth daily.   meloxicam 15 MG tablet Commonly known as:  MOBIC Take 1 tablet (15 mg total) by mouth daily.   phentermine 37.5 MG capsule Take 1 capsule (37.5 mg total) by mouth every morning.   predniSONE 10 MG  tablet Commonly known as:  DELTASONE Take 1 tablet (10 mg total) by mouth daily with breakfast.   SUMAtriptan 100 MG tablet Commonly known as:  IMITREX TAKE 1 TAB BY MOUTH EVERY 2 HOURS AS NEEDED MAY REPEAT IN 2 HOURS IF HEADACHE PERSISTS   Vitamin D 400 units capsule Take 800 Units by mouth daily.   zolmitriptan 5 MG tablet Commonly known as:  ZOMIG TAKE 1/2 TO 1 TABLETS AS NEEDED FOR MIGRAINE.MAY REPEAT IN 2 HOURS IF NEEDED.MAX 10MG  DAILY          Objective:    BP 137/81   Pulse 71   Temp 98.2 F (36.8 C) (Oral)   Ht 5\' 6"  (1.676 m)   Wt 180 lb (81.6 kg)   BMI 29.05 kg/m   Allergies  Allergen Reactions  . Sulfa Antibiotics Hives    Wt Readings from Last 3 Encounters:  02/28/18 180 lb (81.6 kg)  01/05/18 179 lb (81.2 kg)  10/24/17 178 lb (80.7 kg)    Physical Exam  Constitutional: She is oriented to person, place, and time. She appears well-developed and well-nourished.  HENT:  Head: Normocephalic and atraumatic.  Eyes: Pupils  are equal, round, and reactive to light. Conjunctivae and EOM are normal.  Cardiovascular: Normal rate, regular rhythm, normal heart sounds and intact distal pulses.  Pulmonary/Chest: Effort normal and breath sounds normal.  Abdominal: Soft. Bowel sounds are normal.  Musculoskeletal:       Left hip: She exhibits decreased range of motion and decreased strength.       Legs: Neurological: She is alert and oriented to person, place, and time. She has normal reflexes.  Skin: Skin is warm and dry. No rash noted.  Psychiatric: She has a normal mood and affect. Her behavior is normal. Judgment and thought content normal.       Assessment & Plan:   1. Trochanteric bursitis of left hip - predniSONE (DELTASONE) 10 MG tablet; Take 1 tablet (10 mg total) by mouth daily with breakfast.  Dispense: 30 tablet; Refill: 1  Continue all other maintenance medications as listed above.  Follow up plan: No follow-ups on file.  Educational handout  given for [place] PA-C [facility] [address]  [place], [place] [address] [phone]   02/28/2018, 1:27 PM

## 2018-03-08 DIAGNOSIS — M5116 Intervertebral disc disorders with radiculopathy, lumbar region: Secondary | ICD-10-CM | POA: Diagnosis not present

## 2018-03-08 DIAGNOSIS — M9903 Segmental and somatic dysfunction of lumbar region: Secondary | ICD-10-CM | POA: Diagnosis not present

## 2018-03-08 DIAGNOSIS — M9901 Segmental and somatic dysfunction of cervical region: Secondary | ICD-10-CM | POA: Diagnosis not present

## 2018-03-13 DIAGNOSIS — M9901 Segmental and somatic dysfunction of cervical region: Secondary | ICD-10-CM | POA: Diagnosis not present

## 2018-03-13 DIAGNOSIS — M5116 Intervertebral disc disorders with radiculopathy, lumbar region: Secondary | ICD-10-CM | POA: Diagnosis not present

## 2018-03-13 DIAGNOSIS — M9903 Segmental and somatic dysfunction of lumbar region: Secondary | ICD-10-CM | POA: Diagnosis not present

## 2018-03-15 DIAGNOSIS — M9903 Segmental and somatic dysfunction of lumbar region: Secondary | ICD-10-CM | POA: Diagnosis not present

## 2018-03-15 DIAGNOSIS — M5116 Intervertebral disc disorders with radiculopathy, lumbar region: Secondary | ICD-10-CM | POA: Diagnosis not present

## 2018-03-15 DIAGNOSIS — M9901 Segmental and somatic dysfunction of cervical region: Secondary | ICD-10-CM | POA: Diagnosis not present

## 2018-03-17 DIAGNOSIS — M5116 Intervertebral disc disorders with radiculopathy, lumbar region: Secondary | ICD-10-CM | POA: Diagnosis not present

## 2018-03-17 DIAGNOSIS — M9903 Segmental and somatic dysfunction of lumbar region: Secondary | ICD-10-CM | POA: Diagnosis not present

## 2018-03-17 DIAGNOSIS — M9901 Segmental and somatic dysfunction of cervical region: Secondary | ICD-10-CM | POA: Diagnosis not present

## 2018-03-22 DIAGNOSIS — M9903 Segmental and somatic dysfunction of lumbar region: Secondary | ICD-10-CM | POA: Diagnosis not present

## 2018-03-22 DIAGNOSIS — M9901 Segmental and somatic dysfunction of cervical region: Secondary | ICD-10-CM | POA: Diagnosis not present

## 2018-03-22 DIAGNOSIS — M5116 Intervertebral disc disorders with radiculopathy, lumbar region: Secondary | ICD-10-CM | POA: Diagnosis not present

## 2018-03-27 DIAGNOSIS — M9903 Segmental and somatic dysfunction of lumbar region: Secondary | ICD-10-CM | POA: Diagnosis not present

## 2018-03-27 DIAGNOSIS — M9901 Segmental and somatic dysfunction of cervical region: Secondary | ICD-10-CM | POA: Diagnosis not present

## 2018-03-27 DIAGNOSIS — M5116 Intervertebral disc disorders with radiculopathy, lumbar region: Secondary | ICD-10-CM | POA: Diagnosis not present

## 2018-04-03 ENCOUNTER — Other Ambulatory Visit: Payer: Self-pay | Admitting: *Deleted

## 2018-04-03 ENCOUNTER — Other Ambulatory Visit: Payer: Self-pay | Admitting: Physician Assistant

## 2018-04-03 DIAGNOSIS — M5116 Intervertebral disc disorders with radiculopathy, lumbar region: Secondary | ICD-10-CM | POA: Diagnosis not present

## 2018-04-03 DIAGNOSIS — M9903 Segmental and somatic dysfunction of lumbar region: Secondary | ICD-10-CM | POA: Diagnosis not present

## 2018-04-03 DIAGNOSIS — M9901 Segmental and somatic dysfunction of cervical region: Secondary | ICD-10-CM | POA: Diagnosis not present

## 2018-04-03 MED ORDER — ZOLMITRIPTAN 5 MG PO TABS: ORAL_TABLET | ORAL | 4 refills | Status: DC

## 2018-04-11 ENCOUNTER — Ambulatory Visit (INDEPENDENT_AMBULATORY_CARE_PROVIDER_SITE_OTHER): Payer: 59 | Admitting: Family

## 2018-04-11 ENCOUNTER — Encounter: Payer: Self-pay | Admitting: Family

## 2018-04-11 VITALS — BP 115/72 | HR 77 | Temp 98.9°F | Ht 66.0 in | Wt 177.0 lb

## 2018-04-11 DIAGNOSIS — B9689 Other specified bacterial agents as the cause of diseases classified elsewhere: Secondary | ICD-10-CM

## 2018-04-11 DIAGNOSIS — J208 Acute bronchitis due to other specified organisms: Secondary | ICD-10-CM

## 2018-04-11 DIAGNOSIS — R6889 Other general symptoms and signs: Secondary | ICD-10-CM | POA: Diagnosis not present

## 2018-04-11 LAB — VERITOR FLU A/B WAIVED
INFLUENZA B: NEGATIVE
Influenza A: NEGATIVE

## 2018-04-11 MED ORDER — AZITHROMYCIN 250 MG PO TABS: ORAL_TABLET | ORAL | 0 refills | Status: DC

## 2018-04-11 NOTE — Progress Notes (Signed)
Subjective:    Patient ID: Carolyn Cooper, female    DOB: 03-Dec-1955, 63 y.o.   MRN: [id number]  Chief Complaint  Patient presents with  . Cough  . Chills  . Generalized Body Aches  . Nausea    Cough  This is a new problem. The current episode started in the past 7 days. The problem has been gradually worsening. The problem occurs every few minutes. The cough is non-productive. Associated symptoms include chills, headaches, myalgias, nasal congestion, a sore throat, shortness of breath and wheezing. Pertinent negatives include no ear congestion, ear pain or fever. The symptoms are aggravated by lying down. She has tried rest and OTC cough suppressant for the symptoms. The treatment provided mild relief. There is no history of asthma or COPD.      Review of Systems  Constitutional: Positive for chills. Negative for fever.  HENT: Positive for sore throat. Negative for ear pain.   Respiratory: Positive for cough, shortness of breath and wheezing.   Musculoskeletal: Positive for myalgias.  Neurological: Positive for headaches.  All other systems reviewed and are negative.      Objective:   Physical Exam Vitals signs reviewed.  Constitutional:      General: She is not in acute distress.    Appearance: Normal appearance. She is well-developed. She is ill-appearing.  HENT:     Head: Normocephalic and atraumatic.     Right Ear: External ear normal.     Nose:     Right Turbinates: Swollen.     Left Turbinates: Swollen.     Mouth/Throat:     Pharynx: Pharyngeal swelling and posterior oropharyngeal erythema present.  Eyes:     Pupils: Pupils are equal, round, and reactive to light.  Neck:     Musculoskeletal: Normal range of motion and neck supple.     Thyroid: No thyromegaly.  Cardiovascular:     Rate and Rhythm: Normal rate and regular rhythm.     Heart sounds: Normal heart sounds. No murmur.  Pulmonary:     Effort: Pulmonary effort is normal. No respiratory distress.   Breath sounds: Normal breath sounds. No wheezing.     Comments: Intermittent nonproductive cough Abdominal:     General: Bowel sounds are normal. There is no distension.     Palpations: Abdomen is soft.     Tenderness: There is no abdominal tenderness.  Musculoskeletal: Normal range of motion.        General: No tenderness.  Skin:    General: Skin is warm and dry.  Neurological:     Mental Status: She is alert and oriented to person, place, and time.     Cranial Nerves: No cranial nerve deficit.     Deep Tendon Reflexes: Reflexes are normal and symmetric.  Psychiatric:        Behavior: Behavior normal.        Thought Content: Thought content normal.        Judgment: Judgment normal.       BP 115/72   Pulse 77   Temp 98.9 F (37.2 C) (Oral)   Ht 5\' 6"  (1.676 m)   Wt 177 lb (80.3 kg)   BMI 28.57 kg/m      Assessment & Plan:  Carolyn Cooper comes in today with chief complaint of Cough; Chills; Generalized Body Aches; and Nausea   Diagnosis and orders addressed:  1. Flu-like symptoms - Veritor Flu A/B Waived  2. Acute bacterial bronchitis - Take meds as prescribed -  Use a cool mist humidifier  -Use saline nose sprays frequently -Force fluids -For any cough or congestion  Use plain Mucinex- regular strength or max strength is fine -For fever or aces or pains- take tylenol or ibuprofen. -Throat lozenges if help RTO if symptoms worsen or do not improve - azithromycin (ZITHROMAX) 250 MG tablet; Take 500 mg once, then 250 mg for four days  Dispense: 6 tablet; Refill: 0   Evelina Dun, FNP

## 2018-04-11 NOTE — Patient Instructions (Signed)

## 2018-04-17 ENCOUNTER — Other Ambulatory Visit: Payer: Self-pay | Admitting: Physician Assistant

## 2018-04-19 ENCOUNTER — Telehealth: Payer: Self-pay | Admitting: Family

## 2018-04-19 MED ORDER — DOXYCYCLINE HYCLATE 100 MG PO TABS: 100.0000 mg | ORAL_TABLET | Freq: Two times a day (BID) | ORAL | 0 refills | Status: DC

## 2018-04-19 NOTE — Telephone Encounter (Signed)
Doxycycline Prescription sent to pharmacy   

## 2018-04-19 NOTE — Telephone Encounter (Signed)
What symptoms do you have? Still has head congestion and wants another Zpak  How long have you been sick? Two weeks  Have you been seen for this problem? Yes  If your provider decides to give you a prescription, which pharmacy would you like for it to be sent to? [facility] [place]   Patient informed that this information will be sent to the clinical staff for review and that they should receive a follow up call.

## 2018-04-19 NOTE — Telephone Encounter (Signed)
Please advise on new script

## 2018-04-19 NOTE — Telephone Encounter (Signed)
Pt aware.

## 2018-04-21 ENCOUNTER — Ambulatory Visit: Payer: 59 | Admitting: Family

## 2018-04-26 ENCOUNTER — Other Ambulatory Visit: Payer: Self-pay | Admitting: Physician Assistant

## 2018-04-26 DIAGNOSIS — M7062 Trochanteric bursitis, left hip: Secondary | ICD-10-CM

## 2018-05-05 ENCOUNTER — Telehealth: Payer: Self-pay | Admitting: Family Medicine

## 2018-05-05 ENCOUNTER — Other Ambulatory Visit: Payer: Self-pay | Admitting: Family Medicine

## 2018-05-05 MED ORDER — OSELTAMIVIR PHOSPHATE 75 MG PO CAPS: 75.0000 mg | ORAL_CAPSULE | Freq: Every day | ORAL | 0 refills | Status: DC

## 2018-05-05 NOTE — Telephone Encounter (Signed)
What is the name of the medication? Tamiflu  Have you contacted your pharmacy to request a refill? NO  Which pharmacy would you like this sent to? [facility] [place]   Patient notified that their request is being sent to the clinical staff for review and that they should receive a call once it is complete. If they do not receive a call within 24 hours they can check with their pharmacy or our office.

## 2018-05-05 NOTE — Telephone Encounter (Signed)
Patient notified

## 2018-05-05 NOTE — Telephone Encounter (Signed)
I sent in the requested prescription 

## 2018-06-05 ENCOUNTER — Other Ambulatory Visit: Payer: Self-pay | Admitting: *Deleted

## 2018-06-05 MED ORDER — ELETRIPTAN HYDROBROMIDE 40 MG PO TABS: ORAL_TABLET | ORAL | 6 refills | Status: DC

## 2018-06-05 NOTE — Progress Notes (Signed)
Faxed RF request from [facility]

## 2018-07-21 ENCOUNTER — Other Ambulatory Visit: Payer: Self-pay | Admitting: Physician Assistant

## 2018-07-25 ENCOUNTER — Encounter: Payer: 59 | Admitting: Physician Assistant

## 2018-07-26 ENCOUNTER — Ambulatory Visit: Payer: 59 | Admitting: Physician Assistant

## 2018-08-01 ENCOUNTER — Ambulatory Visit (INDEPENDENT_AMBULATORY_CARE_PROVIDER_SITE_OTHER): Payer: 59 | Admitting: Physician Assistant

## 2018-08-01 ENCOUNTER — Other Ambulatory Visit: Payer: Self-pay

## 2018-08-01 ENCOUNTER — Encounter: Payer: Self-pay | Admitting: Physician Assistant

## 2018-08-01 VITALS — BP 138/83 | HR 65 | Temp 97.1°F | Ht 66.0 in | Wt 180.0 lb

## 2018-08-01 DIAGNOSIS — G43809 Other migraine, not intractable, without status migrainosus: Secondary | ICD-10-CM | POA: Diagnosis not present

## 2018-08-01 DIAGNOSIS — M533 Sacrococcygeal disorders, not elsewhere classified: Secondary | ICD-10-CM

## 2018-08-01 DIAGNOSIS — E559 Vitamin D deficiency, unspecified: Secondary | ICD-10-CM | POA: Diagnosis not present

## 2018-08-01 DIAGNOSIS — F419 Anxiety disorder, unspecified: Secondary | ICD-10-CM

## 2018-08-01 DIAGNOSIS — M7072 Other bursitis of hip, left hip: Secondary | ICD-10-CM

## 2018-08-01 DIAGNOSIS — Z0000 Encounter for general adult medical examination without abnormal findings: Secondary | ICD-10-CM

## 2018-08-01 DIAGNOSIS — Z0001 Encounter for general adult medical examination with abnormal findings: Secondary | ICD-10-CM | POA: Diagnosis not present

## 2018-08-01 DIAGNOSIS — M7071 Other bursitis of hip, right hip: Secondary | ICD-10-CM | POA: Insufficient documentation

## 2018-08-01 MED ORDER — ELETRIPTAN HYDROBROMIDE 40 MG PO TABS: ORAL_TABLET | ORAL | 6 refills | Status: DC

## 2018-08-01 MED ORDER — MELOXICAM 15 MG PO TABS: 15.0000 mg | ORAL_TABLET | Freq: Every day | ORAL | 5 refills | Status: DC

## 2018-08-01 MED ORDER — ZOLMITRIPTAN 5 MG PO TABS: ORAL_TABLET | ORAL | 4 refills | Status: DC

## 2018-08-01 MED ORDER — ESCITALOPRAM OXALATE 20 MG PO TABS: 20.0000 mg | ORAL_TABLET | Freq: Every day | ORAL | 3 refills | Status: DC

## 2018-08-01 MED ORDER — SUMATRIPTAN SUCCINATE 100 MG PO TABS: ORAL_TABLET | ORAL | 11 refills | Status: DC

## 2018-08-01 NOTE — Progress Notes (Signed)
BP 138/83   Pulse 65   Temp (!) 97.1 F (36.2 C) (Oral)   Ht '5\' 6"'  (1.676 m)   Wt 180 lb (81.6 kg)   BMI 29.05 kg/m    Subjective:    Patient ID: Carolyn Cooper, female    DOB: 23-Oct-1955, 63 y.o.   MRN: [id number]  HPI: Carolyn Cooper is a 63 y.o. female presenting on 08/01/2018 for Medical Management of Chronic Issues  Patient is here for an annual check of her chronic medical conditions.  She states that overall she is feeling fairly good and is anticipating retirement this week.  She is however having much more migraines.  She was able to use hemp oil and it did calm things down.  However when she started having the manipulations performed at the chiropractor it is caused her headaches to return.  So she is still using her multiple regimen of triptan medications to treat her headache.  She still continues with lots of stress and anxiety related to family matters.  She is still taking the Lexapro.  Has done very well.  She also continues with low back pain and bilateral hip pain.  MRI showed bursitis of the hips.  She will be working on a walking regimen and stretching activity once she is retired.  Labs will be performed.  Past Medical History:  Diagnosis Date  . Cataract    had surgery  . Depression   . IBS (irritable bowel syndrome)   . Migraines   . Osteopenia    2012 T score - 1.3, 2015 T score normal   Relevant past medical, surgical, family and social history reviewed and updated as indicated. Interim medical history since our last visit reviewed. Allergies and medications reviewed and updated. DATA REVIEWED: CHART IN EPIC  Family History reviewed for pertinent findings.  Review of Systems  Constitutional: Negative.   HENT: Negative.   Eyes: Negative.   Respiratory: Negative.   Gastrointestinal: Negative.   Genitourinary: Negative.   Musculoskeletal: Positive for arthralgias, back pain, myalgias, neck pain and neck stiffness.  Neurological: Positive for  headaches.  Psychiatric/Behavioral: Negative.     Allergies as of 08/01/2018      Reactions   Sulfa Antibiotics Hives      Medication List       Accurate as of August 01, 2018  1:30 PM. Always use your most recent med list.        calcium carbonate 600 MG Tabs tablet Commonly known as:  OS-CAL Take 600 mg by mouth 2 (two) times daily with a meal.   eletriptan 40 MG tablet Commonly known as:  RELPAX TAKE 1 TABLET (40 MG TOTAL) BY MOUTH AS NEEDED FOR MIGRAINE OR HEADACHE. MAY REPEAT X1 IN 24HOURS   escitalopram 20 MG tablet Commonly known as:  LEXAPRO Take 1 tablet (20 mg total) by mouth daily.   meloxicam 15 MG tablet Commonly known as:  MOBIC Take 1 tablet (15 mg total) by mouth daily.   SUMAtriptan 100 MG tablet Commonly known as:  IMITREX TAKE 1 TAB BY MOUTH EVERY 2 HOURS AS NEEDED MAY REPEAT IN 2 HOURS IF HEADACHE PERSISTS   Vitamin D 400 units capsule Take 800 Units by mouth daily.   zolmitriptan 5 MG tablet Commonly known as:  ZOMIG TAKE 1/2 TO 1 TABLETS AS NEEDED FOR MIGRAINE.MAY REPEAT IN 2 HOURS IF NEEDED.MAX 10MG DAILY          Objective:    BP 138/83  Pulse 65   Temp (!) 97.1 F (36.2 C) (Oral)   Ht '5\' 6"'  (1.676 m)   Wt 180 lb (81.6 kg)   BMI 29.05 kg/m   Allergies  Allergen Reactions  . Sulfa Antibiotics Hives    Wt Readings from Last 3 Encounters:  08/01/18 180 lb (81.6 kg)  04/11/18 177 lb (80.3 kg)  02/28/18 180 lb (81.6 kg)    Physical Exam Constitutional:      Appearance: She is well-developed.  HENT:     Head: Normocephalic and atraumatic.  Eyes:     Conjunctiva/sclera: Conjunctivae normal.     Pupils: Pupils are equal, round, and reactive to light.  Cardiovascular:     Rate and Rhythm: Normal rate and regular rhythm.     Heart sounds: Normal heart sounds.  Pulmonary:     Effort: Pulmonary effort is normal.     Breath sounds: Normal breath sounds.  Abdominal:     General: Bowel sounds are normal.     Palpations:  Abdomen is soft.  Musculoskeletal:     Right hip: She exhibits tenderness. She exhibits normal strength and no swelling.     Left hip: She exhibits tenderness. She exhibits normal range of motion, normal strength and no swelling.       Legs:  Skin:    General: Skin is warm and dry.     Findings: No rash.  Neurological:     Mental Status: She is alert and oriented to person, place, and time.     Deep Tendon Reflexes: Reflexes are normal and symmetric.  Psychiatric:        Behavior: Behavior normal.        Thought Content: Thought content normal.        Judgment: Judgment normal.         Assessment & Plan:   1. Well adult exam - CBC with Differential/Platelet; Future - CMP14+EGFR; Future - Lipid panel; Future - TSH; Future  2. Other migraine without status migrainosus, not intractable - eletriptan (RELPAX) 40 MG tablet; TAKE 1 TABLET (40 MG TOTAL) BY MOUTH AS NEEDED FOR MIGRAINE OR HEADACHE. MAY REPEAT X1 IN 24HOURS  Dispense: 10 tablet; Refill: 6 - SUMAtriptan (IMITREX) 100 MG tablet; TAKE 1 TAB BY MOUTH EVERY 2 HOURS AS NEEDED MAY REPEAT IN 2 HOURS IF HEADACHE PERSISTS  Dispense: 10 tablet; Refill: 11 - zolmitriptan (ZOMIG) 5 MG tablet; TAKE 1/2 TO 1 TABLETS AS NEEDED FOR MIGRAINE.MAY REPEAT IN 2 HOURS IF NEEDED.MAX 10MG DAILY  Dispense: 10 tablet; Refill: 4  3. Vitamin D deficiency  4. SI (sacroiliac) joint dysfunction - meloxicam (MOBIC) 15 MG tablet; Take 1 tablet (15 mg total) by mouth daily.  Dispense: 30 tablet; Refill: 5  5. Anxiety - escitalopram (LEXAPRO) 20 MG tablet; Take 1 tablet (20 mg total) by mouth daily.  Dispense: 90 tablet; Refill: 3  6. Bursitis of both hips, unspecified bursa - meloxicam (MOBIC) 15 MG tablet; Take 1 tablet (15 mg total) by mouth daily.  Dispense: 30 tablet; Refill: 5   Continue all other maintenance medications as listed above.  Follow up plan: No follow-ups on file.  Educational handout given for [place] PA-C  [facility] [address]  [place], [place] [address] [phone]   08/01/2018, 1:30 PM

## 2018-08-02 ENCOUNTER — Other Ambulatory Visit: Payer: 59

## 2018-08-02 DIAGNOSIS — Z0000 Encounter for general adult medical examination without abnormal findings: Secondary | ICD-10-CM | POA: Diagnosis not present

## 2018-08-03 LAB — CMP14+EGFR
ALT: 14 IU/L (ref 0–32)
AST: 18 IU/L (ref 0–40)
Albumin/Globulin Ratio: 2.3 — ABNORMAL HIGH (ref 1.2–2.2)
Albumin: 4.5 g/dL (ref 3.8–4.8)
Alkaline Phosphatase: 88 IU/L (ref 39–117)
BUN/Creatinine Ratio: 21 (ref 12–28)
BUN: 16 mg/dL (ref 8–27)
Bilirubin Total: 0.4 mg/dL (ref 0.0–1.2)
CO2: 24 mmol/L (ref 20–29)
Calcium: 9.3 mg/dL (ref 8.7–10.3)
Chloride: 107 mmol/L — ABNORMAL HIGH (ref 96–106)
Creatinine, Ser: 0.78 mg/dL (ref 0.57–1.00)
GFR calc Af Amer: 95 mL/min/{1.73_m2} (ref >59)
GFR calc non Af Amer: 82 mL/min/{1.73_m2} (ref >59)
Globulin, Total: 2 g/dL (ref 1.5–4.5)
Glucose: 101 mg/dL — ABNORMAL HIGH (ref 65–99)
Potassium: 4.2 mmol/L (ref 3.5–5.2)
Sodium: 144 mmol/L (ref 134–144)
Total Protein: 6.5 g/dL (ref 6.0–8.5)

## 2018-08-03 LAB — LIPID PANEL
Chol/HDL Ratio: 2.6 ratio (ref 0.0–4.4)
Cholesterol, Total: 171 mg/dL (ref 100–199)
HDL: 67 mg/dL (ref >39)
LDL Calculated: 92 mg/dL (ref 0–99)
Triglycerides: 61 mg/dL (ref 0–149)
VLDL Cholesterol Cal: 12 mg/dL (ref 5–40)

## 2018-08-03 LAB — CBC WITH DIFFERENTIAL/PLATELET
Basophils Absolute: 0 10*3/uL (ref 0.0–0.2)
Basos: 1 %
EOS (ABSOLUTE): 0.2 10*3/uL (ref 0.0–0.4)
Eos: 3 %
Hematocrit: 38.5 % (ref 34.0–46.6)
Hemoglobin: 13 g/dL (ref 11.1–15.9)
Immature Grans (Abs): 0 10*3/uL (ref 0.0–0.1)
Immature Granulocytes: 0 %
Lymphocytes Absolute: 1.2 10*3/uL (ref 0.7–3.1)
Lymphs: 23 %
MCH: 32 pg (ref 26.6–33.0)
MCHC: 33.8 g/dL (ref 31.5–35.7)
MCV: 95 fL (ref 79–97)
Monocytes Absolute: 0.4 10*3/uL (ref 0.1–0.9)
Monocytes: 8 %
Neutrophils Absolute: 3.5 10*3/uL (ref 1.4–7.0)
Neutrophils: 65 %
Platelets: 246 10*3/uL (ref 150–450)
RBC: 4.06 x10E6/uL (ref 3.77–5.28)
RDW: 11.9 % (ref 11.7–15.4)
WBC: 5.3 10*3/uL (ref 3.4–10.8)

## 2018-08-03 LAB — TSH: TSH: 3.18 u[IU]/mL (ref 0.450–4.500)

## 2018-08-11 ENCOUNTER — Other Ambulatory Visit: Payer: Self-pay | Admitting: Physician Assistant

## 2018-09-15 ENCOUNTER — Other Ambulatory Visit: Payer: Self-pay | Admitting: *Deleted

## 2018-09-15 DIAGNOSIS — G43809 Other migraine, not intractable, without status migrainosus: Secondary | ICD-10-CM

## 2018-11-14 ENCOUNTER — Other Ambulatory Visit: Payer: Self-pay | Admitting: *Deleted

## 2018-11-14 DIAGNOSIS — G43809 Other migraine, not intractable, without status migrainosus: Secondary | ICD-10-CM

## 2018-12-09 ENCOUNTER — Other Ambulatory Visit: Payer: Self-pay | Admitting: Physician Assistant

## 2018-12-13 ENCOUNTER — Other Ambulatory Visit: Payer: Self-pay | Admitting: Physician Assistant

## 2019-01-02 ENCOUNTER — Encounter: Payer: Self-pay | Admitting: Gynecology

## 2019-01-11 ENCOUNTER — Encounter: Payer: 59 | Admitting: Gynecology

## 2019-01-16 ENCOUNTER — Other Ambulatory Visit: Payer: Self-pay

## 2019-01-17 ENCOUNTER — Encounter: Payer: Self-pay | Admitting: Gynecology

## 2019-01-17 ENCOUNTER — Ambulatory Visit (INDEPENDENT_AMBULATORY_CARE_PROVIDER_SITE_OTHER): Payer: 59 | Admitting: Gynecology

## 2019-01-17 VITALS — BP 130/80 | Ht 65.5 in | Wt 173.0 lb

## 2019-01-17 DIAGNOSIS — N952 Postmenopausal atrophic vaginitis: Secondary | ICD-10-CM | POA: Diagnosis not present

## 2019-01-17 DIAGNOSIS — Z01419 Encounter for gynecological examination (general) (routine) without abnormal findings: Secondary | ICD-10-CM

## 2019-01-17 DIAGNOSIS — M8580 Other specified disorders of bone density and structure, unspecified site: Secondary | ICD-10-CM

## 2019-01-17 NOTE — Progress Notes (Signed)
    Carolyn Cooper 1955/04/26 [id number]        63 y.o.  G4P4 for annual gynecologic exam.  Without gynecologic complaints  Past medical history,surgical history, problem list, medications, allergies, family history and social history were all reviewed and documented as reviewed in the EPIC chart.  ROS:  Performed with pertinent positives and negatives included in the history, assessment and plan.   Additional significant findings : None   Exam: [name] assistant Vitals:   01/17/19 0939  BP: 130/80  Weight: 173 lb (78.5 kg)  Height: 5' 5.5" (1.664 m)   Body mass index is 28.35 kg/m.  General appearance:  Normal affect, orientation and appearance. Skin: Grossly normal HEENT: Without gross lesions.  No cervical or supraclavicular adenopathy. Thyroid normal.  Lungs:  Clear without wheezing, rales or rhonchi Cardiac: RR, without RMG Abdominal:  Soft, nontender, without masses, guarding, rebound, organomegaly or hernia Breasts:  Examined lying and sitting without masses, retractions, discharge or axillary adenopathy. Pelvic:  Ext, BUS, Vagina: With atrophic changes  Cervix: With atrophic changes.  Old cone scarring noted  Uterus: Anteverted, normal size, shape and contour, midline and mobile nontender   Adnexa: Without masses or tenderness    Anus and perineum: Normal with old external hemorrhoids  Rectovaginal: Normal sphincter tone without palpated masses or tenderness.    Assessment/Plan:  63 y.o. G47P4 female for annual gynecologic exam.   1. Postmenopausal.  No significant menopausal symptoms or any vaginal bleeding. 2. Osteopenia.  DEXA 2019 T score -1.5 FRAX 8% / 0.7%.  Plan repeat DEXA in another year or 2. 3. Colonoscopy 2019.  Repeat at their recommended interval. 4. Mammography 09/2018.  Continue with annual mammography when due.  Breast exam normal today. 5. Pap smear/HPV 2019.  No Pap smear done today.  Plan repeat Pap smear at 5-year interval per current screening  guidelines.  History of cone biopsy 1996 with normal Pap smears since. 6. Health maintenance.  Has started a diet/exercise program.  Has lost 12 pounds.  Congratulations given.  No routine lab work done as patient does this elsewhere.  Follow-up 1 year, sooner as needed.   Anastasio Auerbach MD, 10:07 AM 01/17/2019

## 2019-01-17 NOTE — Patient Instructions (Signed)
Congratulations on your weight loss !  Follow-up in 1 year for annual exam

## 2019-01-22 ENCOUNTER — Other Ambulatory Visit: Payer: Self-pay | Admitting: Physician Assistant

## 2019-01-22 DIAGNOSIS — G43809 Other migraine, not intractable, without status migrainosus: Secondary | ICD-10-CM

## 2019-02-16 ENCOUNTER — Other Ambulatory Visit: Payer: Self-pay | Admitting: Physician Assistant

## 2019-02-16 DIAGNOSIS — M7071 Other bursitis of hip, right hip: Secondary | ICD-10-CM

## 2019-02-16 DIAGNOSIS — M533 Sacrococcygeal disorders, not elsewhere classified: Secondary | ICD-10-CM

## 2019-02-22 ENCOUNTER — Telehealth: Payer: Self-pay | Admitting: Physician Assistant

## 2019-02-22 ENCOUNTER — Other Ambulatory Visit: Payer: Self-pay

## 2019-02-22 DIAGNOSIS — Z20822 Contact with and (suspected) exposure to covid-19: Secondary | ICD-10-CM

## 2019-02-22 NOTE — Telephone Encounter (Signed)
Patient has temperature of 100.1, body aches and nasal congestion.  Advised patient that she should be tested for Covid and directed her to the [facility] testing site.  She will go today.

## 2019-02-23 ENCOUNTER — Other Ambulatory Visit: Payer: Self-pay | Admitting: *Deleted

## 2019-02-23 DIAGNOSIS — G43809 Other migraine, not intractable, without status migrainosus: Secondary | ICD-10-CM

## 2019-02-23 MED ORDER — SUMATRIPTAN SUCCINATE 100 MG PO TABS: ORAL_TABLET | ORAL | 0 refills | Status: DC

## 2019-02-25 LAB — NOVEL CORONAVIRUS, NAA: SARS-CoV-2, NAA: DETECTED — AB

## 2019-03-20 ENCOUNTER — Other Ambulatory Visit: Payer: Self-pay | Admitting: Physician Assistant

## 2019-03-22 ENCOUNTER — Telehealth: Payer: Self-pay | Admitting: Physician Assistant

## 2019-03-22 ENCOUNTER — Other Ambulatory Visit: Payer: Self-pay | Admitting: Physician Assistant

## 2019-03-22 ENCOUNTER — Encounter: Payer: Self-pay | Admitting: Physician Assistant

## 2019-03-22 MED ORDER — NARATRIPTAN HCL 2.5 MG PO TABS: 2.5000 mg | ORAL_TABLET | ORAL | 1 refills | Status: DC | PRN

## 2019-03-22 NOTE — Telephone Encounter (Signed)
Lmtcb.

## 2019-03-22 NOTE — Telephone Encounter (Signed)
Wants to know if we can call her in an Rx for Amerge for her migraines.

## 2019-03-22 NOTE — Telephone Encounter (Signed)
Sent. Which ones is she taking the most?

## 2019-03-25 ENCOUNTER — Other Ambulatory Visit: Payer: Self-pay | Admitting: Physician Assistant

## 2019-03-25 DIAGNOSIS — G43809 Other migraine, not intractable, without status migrainosus: Secondary | ICD-10-CM

## 2019-03-27 ENCOUNTER — Other Ambulatory Visit: Payer: Self-pay | Admitting: Physician Assistant

## 2019-03-27 DIAGNOSIS — G43809 Other migraine, not intractable, without status migrainosus: Secondary | ICD-10-CM

## 2019-04-06 HISTORY — PX: BREAST LUMPECTOMY: SHX2

## 2019-04-10 ENCOUNTER — Other Ambulatory Visit: Payer: Self-pay

## 2019-04-10 ENCOUNTER — Ambulatory Visit (INDEPENDENT_AMBULATORY_CARE_PROVIDER_SITE_OTHER): Payer: 59 | Admitting: *Deleted

## 2019-04-10 DIAGNOSIS — Z23 Encounter for immunization: Secondary | ICD-10-CM | POA: Diagnosis not present

## 2019-04-25 ENCOUNTER — Other Ambulatory Visit: Payer: Self-pay | Admitting: Physician Assistant

## 2019-04-25 DIAGNOSIS — G43809 Other migraine, not intractable, without status migrainosus: Secondary | ICD-10-CM

## 2019-05-02 ENCOUNTER — Encounter: Payer: Self-pay | Admitting: Nurse Practitioner

## 2019-05-02 ENCOUNTER — Ambulatory Visit: Payer: 59 | Admitting: Nurse Practitioner

## 2019-05-02 ENCOUNTER — Other Ambulatory Visit: Payer: Self-pay

## 2019-05-02 VITALS — BP 136/93 | HR 84 | Temp 99.1°F | Resp 20 | Ht 65.0 in | Wt 168.0 lb

## 2019-05-02 DIAGNOSIS — M79601 Pain in right arm: Secondary | ICD-10-CM | POA: Diagnosis not present

## 2019-05-02 DIAGNOSIS — M79602 Pain in left arm: Secondary | ICD-10-CM | POA: Diagnosis not present

## 2019-05-02 MED ORDER — PREDNISONE 20 MG PO TABS: ORAL_TABLET | ORAL | 0 refills | Status: DC

## 2019-05-02 NOTE — Progress Notes (Signed)
Subjective:    Patient ID: Carolyn Cooper, female    DOB: 1955/10/31, 64 y.o.   MRN: [id number]   Chief Complaint: Upper back pain radiating to arms (arms and hands tingling)   HPI Patient comes in c/o both arma dn fingers tingling. She was afraid it was from modema covid vaccine 1 month ago and is scheduled for her second shot timorrow. She says she has pain all the way down both arms and into hands. Kept her up all night aching. Rates pain 7/10 . Moving them helps with pain.   Review of Systems  Constitutional: Negative for diaphoresis.  Eyes: Negative for pain.  Respiratory: Negative for shortness of breath.   Cardiovascular: Negative for chest pain, palpitations and leg swelling.  Gastrointestinal: Negative for abdominal pain.  Endocrine: Negative for polydipsia.  Skin: Negative for rash.  Neurological: Negative for dizziness, weakness and headaches.  Hematological: Does not bruise/bleed easily.  All other systems reviewed and are negative.      Objective:   Physical Exam Vitals and nursing note reviewed.  Constitutional:      General: She is not in acute distress.    Appearance: Normal appearance. She is well-developed.  HENT:     Head: Normocephalic.     Nose: Nose normal.  Eyes:     Pupils: Pupils are equal, round, and reactive to light.  Neck:     Vascular: No carotid bruit or JVD.  Cardiovascular:     Rate and Rhythm: Normal rate and regular rhythm.     Heart sounds: Normal heart sounds.  Pulmonary:     Effort: Pulmonary effort is normal. No respiratory distress.     Breath sounds: Normal breath sounds. No wheezing or rales.  Chest:     Chest wall: No tenderness.  Abdominal:     General: Bowel sounds are normal. There is no distension or abdominal bruit.     Palpations: Abdomen is soft. There is no hepatomegaly, splenomegaly, mass or pulsatile mass.     Tenderness: There is no abdominal tenderness.  Musculoskeletal:        General: Normal range of motion.       Cervical back: Normal range of motion and neck supple.  Lymphadenopathy:     Cervical: No cervical adenopathy.  Skin:    General: Skin is warm and dry.  Neurological:     General: No focal deficit present.     Mental Status: She is alert and oriented to person, place, and time.     Cranial Nerves: No cranial nerve deficit.     Sensory: No sensory deficit.     Motor: No weakness.     Coordination: Coordination normal.     Deep Tendon Reflexes: Reflexes are normal and symmetric. Reflexes normal.  Psychiatric:        Behavior: Behavior normal.        Thought Content: Thought content normal.        Judgment: Judgment normal.    BP (!) 136/93   Pulse 84   Temp 99.1 F (37.3 C) (Temporal)   Resp 20   Ht 5\' 5"  (1.651 m)   Wt 168 lb (76.2 kg)   SpO2 96%   BMI 27.96 kg/m         Assessment & Plan:  [name] in today with chief complaint of Upper back pain radiating to arms (arms and hands tingling)   1. Bilateral arm pain Rest Warm soaks  Meds ordered this encounter  Medications  . predniSONE (DELTASONE) 20 MG tablet    Sig: 2 po at sametime daily for 5 days    Dispense:  10 tablet    Refill:  0    Order Specific Question:   Supervising Provider    Answer:   Caryl Pina A [id number]   Go ahead and get second covid vaccine    The above assessment and management plan was discussed with the patient. The patient verbalized understanding of and has agreed to the management plan. Patient is aware to call the clinic if symptoms persist or worsen. Patient is aware when to return to the clinic for a follow-up visit. Patient educated on when it is appropriate to go to the emergency department.   Mary-Margaret Hassell Done, FNP

## 2019-05-04 ENCOUNTER — Encounter: Payer: Self-pay | Admitting: Nurse Practitioner

## 2019-05-07 ENCOUNTER — Encounter: Payer: Self-pay | Admitting: Gastroenterology

## 2019-05-07 ENCOUNTER — Other Ambulatory Visit: Payer: Self-pay | Admitting: Physician Assistant

## 2019-05-07 ENCOUNTER — Ambulatory Visit (INDEPENDENT_AMBULATORY_CARE_PROVIDER_SITE_OTHER): Payer: 59 | Admitting: Gastroenterology

## 2019-05-07 ENCOUNTER — Telehealth: Payer: Self-pay | Admitting: Nurse Practitioner

## 2019-05-07 ENCOUNTER — Other Ambulatory Visit: Payer: Self-pay

## 2019-05-07 VITALS — BP 130/80 | HR 64 | Temp 98.2°F | Ht 65.0 in | Wt 169.4 lb

## 2019-05-07 DIAGNOSIS — R200 Anesthesia of skin: Secondary | ICD-10-CM

## 2019-05-07 DIAGNOSIS — K219 Gastro-esophageal reflux disease without esophagitis: Secondary | ICD-10-CM

## 2019-05-07 DIAGNOSIS — R12 Heartburn: Secondary | ICD-10-CM | POA: Diagnosis not present

## 2019-05-07 DIAGNOSIS — R202 Paresthesia of skin: Secondary | ICD-10-CM

## 2019-05-07 DIAGNOSIS — K580 Irritable bowel syndrome with diarrhea: Secondary | ICD-10-CM | POA: Diagnosis not present

## 2019-05-07 MED ORDER — HYOSCYAMINE SULFATE SL 0.125 MG SL SUBL: SUBLINGUAL_TABLET | SUBLINGUAL | 1 refills | Status: DC

## 2019-05-07 MED ORDER — OMEPRAZOLE 40 MG PO CPDR: 40.0000 mg | DELAYED_RELEASE_CAPSULE | Freq: Every day | ORAL | 3 refills | Status: DC

## 2019-05-07 NOTE — Progress Notes (Signed)
Carolyn Cooper    [id number]    Jul 17, 1955  Primary Care Physician:Jones, Londell Moh, PA-C  Referring Physician: Terald Sleeper, PA-C [place],  [place] [id number]   Chief complaint: GERD  HPI:  64 year old female here with complaints of heartburn.  Recent onset symptoms of reflux in the past 6 months.  She has significant heartburn if she does not take omeprazole.  Symptoms are somewhat under control when she takes it. She sometimes feel the swallowing being cut off especially when she drinks soda or water, feels it does not go down as smoothly as it did before.  Denies any difficulty swallowing with food or pills.  She continues to have intermittent episodes of irritable bowel syndrome predominant diarrhea, on average once a month, when she has the episodes she has severe abdominal cramping and often triggers nausea and vomiting.  Most of the time the episode lasts for less than an hour but she feels wiped out for the rest of the day. She is taking meloxicam daily for bursitis.  And also is taking medication for migraine, has episodes frequently.   Colonoscopy 10/24/2017: 50mm sessile serrated polyp in ascending colon, diverticulosis and internal/external hemorrhoids   Outpatient Encounter Medications as of 05/07/2019  Medication Sig  . calcium carbonate (OS-CAL) 600 MG TABS Take 600 mg by mouth 2 (two) times daily with a meal.  . Cholecalciferol (VITAMIN D) 400 UNITS capsule Take 800 Units by mouth daily.    Marland Kitchen eletriptan (RELPAX) 40 MG tablet TAKE 1 TABLET (40 MG TOTAL) BY MOUTH AS NEEDED FOR MIGRAINE OR HEADACHE. MAY REPEAT X1 IN 24HOURS  . escitalopram (LEXAPRO) 20 MG tablet Take 1 tablet (20 mg total) by mouth daily.  . meloxicam (MOBIC) 15 MG tablet TAKE 1 TABLET BY MOUTH EVERY DAY  . naratriptan (AMERGE) 2.5 MG tablet TAKE 1 TABLET AT ONSET OF MIGRAINE, MAY REPEAT AFTER 4 HRS IF NO RELIEF OR HEADACHE RETURB-MAX 5 IN 24 HRS  . rizatriptan (MAXALT) 10 MG tablet  TAKE 1 TABLET AS NEEDED FOR MIGRAINE, MAY REPEAT IN TWO HOURS IF NEEDED. MAX OF 2 DOSES PER 24 HOURS  . SUMAtriptan (IMITREX) 100 MG tablet TAKE 1 TAB BY MOUTH EVERY 2 HOURS AS NEEDED MAY REPEAT IN 2 HOURS IF HEADACHE PERSISTS  . zolmitriptan (ZOMIG) 5 MG tablet TAKE 1/2 TO 1 TABLETS AS NEEDED FOR MIGRAINE.MAY REPEAT IN 2 HOURS IF NEEDED.MAX 10MG  DAILY  . [DISCONTINUED] predniSONE (DELTASONE) 20 MG tablet 2 po at sametime daily for 5 days   Facility-Administered Encounter Medications as of 05/07/2019  Medication  . 0.9 %  sodium chloride infusion    Allergies as of 05/07/2019 - Review Complete 05/07/2019  Allergen Reaction Noted  . Sulfa antibiotics Hives 09/29/2010    Past Medical History:  Diagnosis Date  . Cataract    had surgery  . Depression   . IBS (irritable bowel syndrome)   . Migraines   . Osteopenia 2019   T score -1.5 FRAX 8% / 0.7%    Past Surgical History:  Procedure Laterality Date  . CATARACT EXTRACTION, BILATERAL  2018  . CERVICAL CONE BIOPSY  1996  . COLONOSCOPY  07/21/2007  . CYSTOSCOPY    . MOUTH SURGERY  1978   WISDOM TEETH    Family History  Problem Relation Age of Onset  . Ovarian cancer Maternal Grandmother   . Diverticulitis Mother   . Heart disease Father   .  Kidney cancer Father   . Breast cancer Maternal Aunt 109  . Stomach cancer Maternal Grandfather   . Colon cancer Neg Hx   . Esophageal cancer Neg Hx     Social History   Socioeconomic History  . Marital status: Married    Spouse name: Not on file  . Number of children: Not on file  . Years of education: Not on file  . Highest education level: Not on file  Occupational History  . Not on file  Tobacco Use  . Smoking status: Never Smoker  . Smokeless tobacco: Never Used  Substance and Sexual Activity  . Alcohol use: Yes    Alcohol/week: 0.0 standard drinks    Comment: occassional  . Drug use: No  . Sexual activity: Yes    Birth control/protection: Post-menopausal    Comment:  1st intercourse 64 yo-Fewer than 5 partners  Other Topics Concern  . Not on file  Social History Narrative  . Not on file   Social Determinants of Health   Financial Resource Strain:   . Difficulty of Paying Living Expenses: Not on file  Food Insecurity:   . Worried About Charity fundraiser in the Last Year: Not on file  . Ran Out of Food in the Last Year: Not on file  Transportation Needs:   . Lack of Transportation (Medical): Not on file  . Lack of Transportation (Non-Medical): Not on file  Physical Activity:   . Days of Exercise per Week: Not on file  . Minutes of Exercise per Session: Not on file  Stress:   . Feeling of Stress : Not on file  Social Connections:   . Frequency of Communication with Friends and Family: Not on file  . Frequency of Social Gatherings with Friends and Family: Not on file  . Attends Religious Services: Not on file  . Active Member of Clubs or Organizations: Not on file  . Attends Archivist Meetings: Not on file  . Marital Status: Not on file  Intimate Partner Violence:   . Fear of Current or Ex-Partner: Not on file  . Emotionally Abused: Not on file  . Physically Abused: Not on file  . Sexually Abused: Not on file      Review of systems:  All other review of systems negative except as mentioned in the HPI.   Physical Exam: Vitals:   05/07/19 0928  BP: 130/80  Pulse: 64  Temp: 98.2 F (36.8 C)   Body mass index is 28.19 kg/m. Gen:      No acute distress Abd:      + bowel sounds; soft, non-tender; no palpable masses, no distension Ext:    No edema; adequate peripheral perfusion Skin:      Warm and dry; no rash Neuro: alert and oriented x 3 Psych: normal mood and affect  Data Reviewed:  Reviewed labs, radiology imaging, old records and pertinent past GI work up   Assessment and Plan/Recommendations:  64 year old female with history of GERD with complaints of intermittent dysphagia to liquids and irritable bowel  syndrome predominant diarrhea  GERD: Omeprazole 40 mg daily, 30 minutes before breakfast Discussed antireflux measures  Schedule for EGD for further evaluation of dysphagia, to exclude esophagitis or peptic stricture The risks and benefits as well as alternatives of endoscopic procedure(s) have been discussed and reviewed. All questions answered. The patient agrees to proceed.  IBS predominant diarrhea: Limit intake of caffeinated drinks and soda Levsin sublingual daily as needed for abdominal  cramping and IBS symptoms Avoid or limit NSAID use  Return in 6 months or sooner if needed   The patient was provided an opportunity to ask questions and all were answered. The patient agreed with the plan and demonstrated an understanding of the instructions.  Damaris Hippo , MD    CC: Terald Sleeper, PA-C

## 2019-05-07 NOTE — Telephone Encounter (Signed)
REFERRAL REQUEST Telephone Note  What type of referral do you need? Neurology Hands are still tingling but not as bad  Have you been seen at our office for this problem? Yes with [facility] (Advise that they will likely need an appointment with their PCP before a referral can be done)  Is there a particular doctor or location that you prefer? NO  Patient notified that referrals can take up to a week or longer to process. If they haven't heard anything within a week they should call back and speak with the referral department.

## 2019-05-07 NOTE — Patient Instructions (Addendum)
If you are age 63 or older, your body mass index should be between 23-30. Your Body mass index is 28.19 kg/m. If this is out of the aforementioned range listed, please consider follow up with your Primary Care Provider.  If you are age 63 or younger, your body mass index should be between 19-25. Your Body mass index is 28.19 kg/m. If this is out of the aformentioned range listed, please consider follow up with your Primary Care Provider.    You have been scheduled for an endoscopy. Please follow written instructions given to you at your visit today. If you use inhalers (even only as needed), please bring them with you on the day of your procedure.   We will send Levsin and omeprazole to your pharmacy   Gastroesophageal Reflux Disease, Adult Gastroesophageal reflux (GER) happens when acid from the stomach flows up into the tube that connects the mouth and the stomach (esophagus). Normally, food travels down the esophagus and stays in the stomach to be digested. However, when a person has GER, food and stomach acid sometimes move back up into the esophagus. If this becomes a more serious problem, the person may be diagnosed with a disease called gastroesophageal reflux disease (GERD). GERD occurs when the reflux:  Happens often.  Causes frequent or severe symptoms.  Causes problems such as damage to the esophagus. When stomach acid comes in contact with the esophagus, the acid may cause soreness (inflammation) in the esophagus. Over time, GERD may create small holes (ulcers) in the lining of the esophagus. What are the causes? This condition is caused by a problem with the muscle between the esophagus and the stomach (lower esophageal sphincter, or LES). Normally, the LES muscle closes after food passes through the esophagus to the stomach. When the LES is weakened or abnormal, it does not close properly, and that allows food and stomach acid to go back up into the esophagus. The LES can be  weakened by certain dietary substances, medicines, and medical conditions, including:  Tobacco use.  Pregnancy.  Having a hiatal hernia.  Alcohol use.  Certain foods and beverages, such as coffee, chocolate, onions, and peppermint. What increases the risk? You are more likely to develop this condition if you:  Have an increased body weight.  Have a connective tissue disorder.  Use NSAID medicines. What are the signs or symptoms? Symptoms of this condition include:  Heartburn.  Difficult or painful swallowing.  The feeling of having a lump in the throat.  Abitter taste in the mouth.  Bad breath.  Having a large amount of saliva.  Having an upset or bloated stomach.  Belching.  Chest pain. Different conditions can cause chest pain. Make sure you see your health care provider if you experience chest pain.  Shortness of breath or wheezing.  Ongoing (chronic) cough or a night-time cough.  Wearing away of tooth enamel.  Weight loss. How is this diagnosed? Your health care provider will take a medical history and perform a physical exam. To determine if you have mild or severe GERD, your health care provider may also monitor how you respond to treatment. You may also have tests, including:  A test to examine your stomach and esophagus with a small camera (endoscopy).  A test thatmeasures the acidity level in your esophagus.  A test thatmeasures how much pressure is on your esophagus.  A barium swallow or modified barium swallow test to show the shape, size, and functioning of your esophagus. How is  this treated? The goal of treatment is to help relieve your symptoms and to prevent complications. Treatment for this condition may vary depending on how severe your symptoms are. Your health care provider may recommend:  Changes to your diet.  Medicine.  Surgery. Follow these instructions at home: Eating and drinking   Follow a diet as recommended by your  health care provider. This may involve avoiding foods and drinks such as: ? Coffee and tea (with or without caffeine). ? Drinks that containalcohol. ? Energy drinks and sports drinks. ? Carbonated drinks or sodas. ? Chocolate and cocoa. ? Peppermint and mint flavorings. ? Garlic and onions. ? Horseradish. ? Spicy and acidic foods, including peppers, chili powder, curry powder, vinegar, hot sauces, and barbecue sauce. ? Citrus fruit juices and citrus fruits, such as oranges, lemons, and limes. ? Tomato-based foods, such as red sauce, chili, salsa, and pizza with red sauce. ? Fried and fatty foods, such as donuts, french fries, potato chips, and high-fat dressings. ? High-fat meats, such as hot dogs and fatty cuts of red and white meats, such as rib eye steak, sausage, ham, and bacon. ? High-fat dairy items, such as whole milk, butter, and cream cheese.  Eat small, frequent meals instead of large meals.  Avoid drinking large amounts of liquid with your meals.  Avoid eating meals during the 2-3 hours before bedtime.  Avoid lying down right after you eat.  Do not exercise right after you eat. Lifestyle   Do not use any products that contain nicotine or tobacco, such as cigarettes, e-cigarettes, and chewing tobacco. If you need help quitting, ask your health care provider.  Try to reduce your stress by using methods such as yoga or meditation. If you need help reducing stress, ask your health care provider.  If you are overweight, reduce your weight to an amount that is healthy for you. Ask your health care provider for guidance about a safe weight loss goal. General instructions  Pay attention to any changes in your symptoms.  Take over-the-counter and prescription medicines only as told by your health care provider. Do not take aspirin, ibuprofen, or other NSAIDs unless your health care provider told you to do so.  Wear loose-fitting clothing. Do not wear anything tight around  your waist that causes pressure on your abdomen.  Raise (elevate) the head of your bed about 6 inches (15 cm).  Avoid bending over if this makes your symptoms worse.  Keep all follow-up visits as told by your health care provider. This is important. Contact a health care provider if:  You have: ? New symptoms. ? Unexplained weight loss. ? Difficulty swallowing or it hurts to swallow. ? Wheezing or a persistent cough. ? A hoarse voice.  Your symptoms do not improve with treatment. Get help right away if you:  Have pain in your arms, neck, jaw, teeth, or back.  Feel sweaty, dizzy, or light-headed.  Have chest pain or shortness of breath.  Vomit and your vomit looks like blood or coffee grounds.  Faint.  Have stool that is bloody or black.  Cannot swallow, drink, or eat. Summary  Gastroesophageal reflux happens when acid from the stomach flows up into the esophagus. GERD is a disease in which the reflux happens often, causes frequent or severe symptoms, or causes problems such as damage to the esophagus.  Treatment for this condition may vary depending on how severe your symptoms are. Your health care provider may recommend diet and lifestyle changes, medicine,  or surgery.  Contact a health care provider if you have new or worsening symptoms.  Take over-the-counter and prescription medicines only as told by your health care provider. Do not take aspirin, ibuprofen, or other NSAIDs unless your health care provider told you to do so.  Keep all follow-up visits as told by your health care provider. This is important. This information is not intended to replace advice given to you by your health care provider. Make sure you discuss any questions you have with your health care provider. Document Revised: 09/28/2017 Document Reviewed: 09/28/2017 Elsevier Patient Education  [place].   Follow up in 6 months   I appreciate the  opportunity to care for you  Thank You    Harl Bowie , MD

## 2019-05-07 NOTE — Telephone Encounter (Signed)
orderd

## 2019-05-08 ENCOUNTER — Other Ambulatory Visit: Payer: Self-pay | Admitting: Nurse Practitioner

## 2019-05-08 DIAGNOSIS — R200 Anesthesia of skin: Secondary | ICD-10-CM

## 2019-05-08 NOTE — Telephone Encounter (Signed)
Patient aware.

## 2019-05-08 NOTE — Progress Notes (Signed)
nerve

## 2019-05-10 ENCOUNTER — Encounter: Payer: Self-pay | Admitting: Gastroenterology

## 2019-05-15 ENCOUNTER — Encounter: Payer: Self-pay | Admitting: Neurology

## 2019-05-15 ENCOUNTER — Ambulatory Visit (INDEPENDENT_AMBULATORY_CARE_PROVIDER_SITE_OTHER): Payer: 59 | Admitting: Neurology

## 2019-05-15 ENCOUNTER — Other Ambulatory Visit: Payer: Self-pay

## 2019-05-15 VITALS — BP 129/79 | HR 72 | Temp 98.2°F | Ht 65.5 in | Wt 169.3 lb

## 2019-05-15 DIAGNOSIS — R202 Paresthesia of skin: Secondary | ICD-10-CM

## 2019-05-15 DIAGNOSIS — R200 Anesthesia of skin: Secondary | ICD-10-CM

## 2019-05-15 DIAGNOSIS — M542 Cervicalgia: Secondary | ICD-10-CM | POA: Diagnosis not present

## 2019-05-15 NOTE — Progress Notes (Signed)
Subjective:    Patient ID: Carolyn Cooper is a 64 y.o. female.  HPI     Star Age, MD, PhD [facility] [address]O. [place],  [address]  Dear Carolyn Cooper,   I saw your patient, Carolyn Cooper, upon your kind request in my neurologic clinic today for initial consultation of her upper extremity numbness and tingling.  The patient is unaccompanied today.  As you know, Carolyn Cooper is a 64 year old right-handed woman with an underlying medical history of migraines, osteopenia, irritable bowel syndrome, depression, cataract, status post surgery, and overweight state, who reports tingling in both hands which started about 2 weeks ago.  She has since then had a course of oral steroids and felt improved, she has had intermittent tingling in both feet as well but not as significant as her hands and it started fairly abruptly about 2 weeks ago.  Prior to that she did have tenderness in her neck area and tightness in her neck muscles and some heaviness in both arms but that subsided.  She has been seeing a chiropractor in the past for neck pain and noticed that her migraines got worse after she had chiropractic manipulations and therefore stopped going.  She has a longstanding history of migraines which were responding very well to CBD oil.  She also has been taking triptans without any obvious correlation between the tingling and taking triptan's.  She has not had any sudden onset of one-sided weakness or numbness.  She is not actually feeling numb, more tingly such as pins-and-needles and in the very beginning she had significant burning sensation that extended throughout the entire hands and arms bilaterally.  That burning sensation has subsided.  She is not diabetic.  She does not drink a whole lot of water she admits, she likes to drink coffee in the morning about 2 cups and diet soda about 6 cans/day.  She does not drink any alcohol.  She does endorse stress, she  worries about her son who is 67 and by her report is an alcoholic and is not getting any help currently.  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  She has been traveling some, her parents live about 6 hours away and she visits them about once a month.  She had her Covid vaccine and symptoms developed several weeks after the vaccine shot #1 but she got the second shot after the symptoms of tingling developed and did not have any escalation of her symptoms and in fact currently feels better.  She started using an over the counter splint for her hands which did not make a difference in the beginning.   Her Past Medical History Is Significant For: Past Medical History:  Diagnosis Date  . Cataract    had surgery  . Depression   . IBS (irritable bowel syndrome)   . Migraines   . Osteopenia 2019   T score -1.5 FRAX 8% / 0.7%    Her Past Surgical History Is Significant For: Past Surgical History:  Procedure Laterality Date  . CATARACT EXTRACTION, BILATERAL  2018  . CERVICAL CONE BIOPSY  1996  . COLONOSCOPY  07/21/2007  . CYSTOSCOPY    . MOUTH SURGERY  1978   WISDOM TEETH    Her Family History Is Significant For: Family History  Problem Relation Age of Onset  . Ovarian cancer Maternal Grandmother   . Diverticulitis Mother   . Heart disease Father   . Kidney cancer Father   .  Breast cancer Maternal Aunt 21  . Stomach cancer Maternal Grandfather   . Colon cancer Neg Hx   . Esophageal cancer Neg Hx     Her Social History Is Significant For: Social History   Socioeconomic History  . Marital status: Married    Spouse name: Not on file  . Number of children: Not on file  . Years of education: Not on file  . Highest education level: Not on file  Occupational History  . Not on file  Tobacco Use  . Smoking status: Never Smoker  . Smokeless tobacco: Never Used  Substance and Sexual Activity  . Alcohol use: Yes    Alcohol/week: 0.0 standard drinks    Comment: occassional  . Drug use:  No  . Sexual activity: Yes    Birth control/protection: Post-menopausal    Comment: 1st intercourse 64 yo-Fewer than 5 partners  Other Topics Concern  . Not on file  Social History Narrative  . Not on file   Social Determinants of Health   Financial Resource Strain:   . Difficulty of Paying Living Expenses: Not on file  Food Insecurity:   . Worried About Charity fundraiser in the Last Year: Not on file  . Ran Out of Food in the Last Year: Not on file  Transportation Needs:   . Lack of Transportation (Medical): Not on file  . Lack of Transportation (Non-Medical): Not on file  Physical Activity:   . Days of Exercise per Week: Not on file  . Minutes of Exercise per Session: Not on file  Stress:   . Feeling of Stress : Not on file  Social Connections:   . Frequency of Communication with Friends and Family: Not on file  . Frequency of Social Gatherings with Friends and Family: Not on file  . Attends Religious Services: Not on file  . Active Member of Clubs or Organizations: Not on file  . Attends Archivist Meetings: Not on file  . Marital Status: Not on file    Her Allergies Are:  Allergies  Allergen Reactions  . Sulfa Antibiotics Hives  :   Her Current Medications Are:  Outpatient Encounter Medications as of 05/15/2019  Medication Sig  . calcium carbonate (OS-CAL) 600 MG TABS Take 600 mg by mouth 2 (two) times daily with a meal.  . Cholecalciferol (VITAMIN D) 400 UNITS capsule Take 800 Units by mouth daily.    Marland Kitchen eletriptan (RELPAX) 40 MG tablet TAKE 1 TABLET (40 MG TOTAL) BY MOUTH AS NEEDED FOR MIGRAINE OR HEADACHE. MAY REPEAT X1 IN 24HOURS  . escitalopram (LEXAPRO) 20 MG tablet Take 1 tablet (20 mg total) by mouth daily.  Marland Kitchen Hyoscyamine Sulfate SL (LEVSIN/SL) 0.125 MG SUBL Take I capsule by mouth daily as needed  . meloxicam (MOBIC) 15 MG tablet TAKE 1 TABLET BY MOUTH EVERY DAY  . naratriptan (AMERGE) 2.5 MG tablet TAKE 1 TABLET AT ONSET OF MIGRAINE, MAY REPEAT  AFTER 4 HRS IF NO RELIEF OR HEADACHE RETURB-MAX 5 IN 24 HRS  . omeprazole (PRILOSEC) 40 MG capsule Take 1 capsule (40 mg total) by mouth daily.  . rizatriptan (MAXALT) 10 MG tablet TAKE 1 TABLET AS NEEDED FOR MIGRAINE, MAY REPEAT IN TWO HOURS IF NEEDED. MAX OF 2 DOSES PER 24 HOURS  . SUMAtriptan (IMITREX) 100 MG tablet TAKE 1 TAB BY MOUTH EVERY 2 HOURS AS NEEDED MAY REPEAT IN 2 HOURS IF HEADACHE PERSISTS  . zolmitriptan (ZOMIG) 5 MG tablet TAKE 1/2 TO 1 TABLETS  AS NEEDED FOR MIGRAINE.MAY REPEAT IN 2 HOURS IF NEEDED.MAX 10MG  DAILY   Facility-Administered Encounter Medications as of 05/15/2019  Medication  . 0.9 %  sodium chloride infusion  :   Review of Systems:  Out of a complete 14 point review of systems, all are reviewed and negative with the exception of these symptoms as listed below:    Review of Systems  Neurological:       Here to discuss numbness/tingling in her hands. Reports two weeks ago her sx were severe and since has improved. She reports the tingling is still present but the numbness has improved.     Objective:  Neurological Exam  Physical Exam Physical Examination:   Vitals:   05/15/19 0848  BP: 129/79  Pulse: 72  Temp: 98.2 F (36.8 C)    General Examination: The patient is a very pleasant 64 y.o. female in no acute distress. She appears well-developed and well-nourished and well groomed.   HEENT: Normocephalic, atraumatic, pupils are equal, round and reactive to light and accommodation. Status post bilateral cataract extractions. Funduscopic exam is normal with sharp disc margins noted. Extraocular tracking is good without limitation to gaze excursion or nystagmus noted. Normal smooth pursuit is noted. Hearing is grossly intact. Face is symmetric with normal facial animation and normal facial sensation. Speech is clear with no dysarthria noted. There is no hypophonia. There is no lip, neck/head, jaw or voice tremor. Neck is supple with full range of passive and  active motion. There are no carotid bruits on auscultation. Oropharynx exam reveals: mild mouth dryness, adequate dental hygiene. Tongue protrudes centrally and palate elevates symmetrically.   Chest: Clear to auscultation without wheezing, rhonchi or crackles noted.  Heart: S1+S2+0, regular and normal without murmurs, rubs or gallops noted.   Abdomen: Soft, non-tender and non-distended with normal bowel sounds appreciated on auscultation.  Extremities: There is no pitting edema in the distal lower extremities bilaterally. Pedal pulses are intact.  Skin: Warm and dry without trophic changes noted.  Musculoskeletal: exam reveals no obvious joint deformities, tenderness or joint swelling or erythema.   Neurologically:  Mental status: The patient is awake, alert and oriented in all 4 spheres. Her immediate and remote memory, attention, language skills and fund of knowledge are appropriate. There is no evidence of aphasia, agnosia, apraxia or anomia. Speech is clear with normal prosody and enunciation. Thought process is linear. Mood is normal and affect is normal.  Cranial nerves II - XII are as described above under HEENT exam. In addition: shoulder shrug is normal with equal shoulder height noted. Motor exam: Normal bulk, strength and tone is noted. There is no drift, tremor or rebound. Romberg is negative. Reflexes are 2+ throughout. Babinski: Toes are flexor bilaterally. Fine motor skills and coordination: intact with normal finger taps, normal hand movements, normal rapid alternating patting, normal foot taps and normal foot agility.  Cerebellar testing: No dysmetria or intention tremor on finger to nose testing. Heel to shin is unremarkable bilaterally. There is no truncal or gait ataxia.  Sensory exam: intact to light touch, pinprick, vibration, temperature sense in the upper and lower extremities.  Gait, station and balance: She stands easily. No veering to one side is noted. No leaning to  one side is noted. Posture is age-appropriate and stance is narrow based. Gait shows normal stride length and normal pace. No problems turning are noted. Tandem walk is unremarkable.    Assessment and Plan:    In summary, CHAVA AXT is  a very pleasant 64 y.o.-year old female with an underlying medical history of migraines, osteopenia, irritable bowel syndrome, depression, cataract, status post surgery, and overweight state, who Presents for evaluation of her upper extremity tingling.  This started about 2 weeks ago fairly suddenly, has since then improved, she had a course of oral steroids which she finished recently.  Her history is not very telltale for peripheral neuropathy, nevertheless, further evaluation for treatable cause or structural cause is indicated, to that end, we will proceed with blood work today, we will also do an EMG nerve conduction velocity test in our office and also a cervical spine MRI.  We will call her to schedule her MRI soon and call her with her blood test results as soon as we have the results.  We will also keep her posted as to her Physiological test results.  We will follow-up accordingly, depending on the test results.  We talked about the importance of healthy lifestyle, good nutrition, good hydration.  She is encouraged to stay better hydrated with water.  She is advised to call us with any interim questions or concerns.  We will be in touch by phone call from our end.  I answered all her questions today and she was in agreement.  Thank you very much for allowing me to participate in the care of this nice patient. If I can be of any further assistance to you please do not hesitate to call me at [phone].  Sincerely,   Star Age, MD, PhD

## 2019-05-15 NOTE — Patient Instructions (Signed)
I am glad to hear that you are actually feeling better, nevertheless, for your new onset of tingling in both hands and intermittently in the feet I recommend further evaluation, as discussed, we will proceed with a cervical spine MRI, we will do blood work today to look for vitamin deficiencies, thyroid dysfunction, and inflammatory markers and autoimmune antibodies. We will do an EMG and nerve conduction velocity test, which is an electrical nerve and muscle test, which we will schedule. We will call you with the results.

## 2019-05-20 LAB — COMPREHENSIVE METABOLIC PANEL
ALT: 18 IU/L (ref 0–32)
AST: 16 IU/L (ref 0–40)
Albumin/Globulin Ratio: 1.8 (ref 1.2–2.2)
Albumin: 4.4 g/dL (ref 3.8–4.8)
Alkaline Phosphatase: 99 IU/L (ref 39–117)
BUN/Creatinine Ratio: 15 (ref 12–28)
BUN: 13 mg/dL (ref 8–27)
Bilirubin Total: 0.4 mg/dL (ref 0.0–1.2)
CO2: 26 mmol/L (ref 20–29)
Calcium: 9.9 mg/dL (ref 8.7–10.3)
Chloride: 101 mmol/L (ref 96–106)
Creatinine, Ser: 0.88 mg/dL (ref 0.57–1.00)
GFR calc Af Amer: 81 mL/min/{1.73_m2} (ref >59)
GFR calc non Af Amer: 71 mL/min/{1.73_m2} (ref >59)
Globulin, Total: 2.4 g/dL (ref 1.5–4.5)
Glucose: 82 mg/dL (ref 65–99)
Potassium: 4.1 mmol/L (ref 3.5–5.2)
Sodium: 142 mmol/L (ref 134–144)
Total Protein: 6.8 g/dL (ref 6.0–8.5)

## 2019-05-20 LAB — ANA W/REFLEX: Anti Nuclear Antibody (ANA): NEGATIVE

## 2019-05-20 LAB — SEDIMENTATION RATE: Sed Rate: 3 mm/hr (ref 0–40)

## 2019-05-20 LAB — B12 AND FOLATE PANEL
Folate: 17.9 ng/mL (ref >3.0)
Vitamin B-12: 452 pg/mL (ref 232–1245)

## 2019-05-20 LAB — C-REACTIVE PROTEIN: CRP: 3 mg/L (ref 0–10)

## 2019-05-20 LAB — TSH: TSH: 1.62 u[IU]/mL (ref 0.450–4.500)

## 2019-05-20 LAB — RPR: RPR Ser Ql: NONREACTIVE

## 2019-05-20 LAB — VITAMIN D 25 HYDROXY (VIT D DEFICIENCY, FRACTURES): Vit D, 25-Hydroxy: 48.1 ng/mL (ref 30.0–100.0)

## 2019-05-20 LAB — VITAMIN B6: Vitamin B6: 2.1 ug/L (ref 2.0–32.8)

## 2019-05-20 LAB — HGB A1C W/O EAG: Hgb A1c MFr Bld: 5.5 % (ref 4.8–5.6)

## 2019-05-21 ENCOUNTER — Telehealth: Payer: Self-pay

## 2019-05-21 NOTE — Telephone Encounter (Signed)
Pt notified of lab results and verbalized understanding.

## 2019-05-21 NOTE — Telephone Encounter (Signed)
-----   Message from Star Age, MD sent at 05/21/2019 12:55 PM EST ----- Labs were normal, including vitamin D, vitamin B12, diabetes marker, thyroid screening test.  Please update patient.

## 2019-05-21 NOTE — Progress Notes (Signed)
Labs were normal, including vitamin D, vitamin B12, diabetes marker, thyroid screening test.  Please update patient.

## 2019-05-29 ENCOUNTER — Other Ambulatory Visit: Payer: Self-pay | Admitting: Gastroenterology

## 2019-05-29 ENCOUNTER — Telehealth: Payer: Self-pay | Admitting: Neurology

## 2019-05-29 NOTE — Telephone Encounter (Signed)
LVM for pt to call back about scheduling [facility] Auth: [id number] (exp. 05/23/19 to 07/07/19)

## 2019-05-30 ENCOUNTER — Other Ambulatory Visit: Payer: Self-pay | Admitting: Physician Assistant

## 2019-05-30 DIAGNOSIS — G43809 Other migraine, not intractable, without status migrainosus: Secondary | ICD-10-CM

## 2019-06-11 NOTE — Telephone Encounter (Signed)
Spoke to the patient she is scheduled at [facility] for 06/20/19.

## 2019-06-11 NOTE — Telephone Encounter (Signed)
Pt returned call. Please call back when available. 

## 2019-06-20 ENCOUNTER — Ambulatory Visit: Payer: 59

## 2019-06-20 ENCOUNTER — Other Ambulatory Visit: Payer: Self-pay

## 2019-06-20 DIAGNOSIS — M542 Cervicalgia: Secondary | ICD-10-CM | POA: Diagnosis not present

## 2019-06-20 MED ORDER — GADOBENATE DIMEGLUMINE 529 MG/ML IV SOLN
15.0000 mL | Freq: Once | INTRAVENOUS | Status: AC | PRN
  Administered 2019-06-20: 15 mL via INTRAVENOUS

## 2019-06-21 ENCOUNTER — Encounter: Payer: 59 | Admitting: Diagnostic Neuroimaging

## 2019-06-23 ENCOUNTER — Other Ambulatory Visit: Payer: Self-pay | Admitting: Gastroenterology

## 2019-06-25 ENCOUNTER — Telehealth: Payer: Self-pay

## 2019-06-25 NOTE — Progress Notes (Signed)
Please call patient and advise her that her recent neck MRI with and without contrast showed no obvious or significant abnormalities, in particular, no spinal stenosis, no pinched nerve type changes, no changes in the spinal cord, she had overall mild degenerative changes, nothing that would warrant surgical referral.  Also no obvious explanation of her numbness and tingling in her upper extremities.  As discussed, we will proceed with EMG and nerve conduction velocity testing.  I do not see where she is scheduled.  Please inquire with patient.

## 2019-06-25 NOTE — Telephone Encounter (Signed)
I called pt to discuss her MRI results. No answer, left a message asking her to call me back. 

## 2019-06-25 NOTE — Telephone Encounter (Signed)
-----   Message from Star Age, MD sent at 06/25/2019  9:49 AM EDT ----- Please call patient and advise her that her recent neck MRI with and without contrast showed no obvious or significant abnormalities, in particular, no spinal stenosis, no pinched nerve type changes, no changes in the spinal cord, she had overall mild degenerative changes, nothing that would warrant surgical referral.  Also no obvious explanation of her numbness and tingling in her upper extremities.  As discussed, we will proceed with EMG and nerve conduction velocity testing.  I do not see where she is scheduled.  Please inquire with patient.

## 2019-06-26 ENCOUNTER — Encounter: Payer: Self-pay | Admitting: Gastroenterology

## 2019-06-26 NOTE — Telephone Encounter (Signed)
Pt returned my call and I discussed her MRI cervical results and recommendations with her. She is agreeable to pursuing EMG/NCV study. Pt verbalized understanding of results. Pt had no questions at this time but was encouraged to call back if questions arise.

## 2019-06-26 NOTE — Telephone Encounter (Signed)
I called pt again. No answer, left a message at home and cell number asking her to call me back.

## 2019-06-26 NOTE — Telephone Encounter (Signed)
LVM for pt to call back to schedule NCV/EMG

## 2019-06-27 ENCOUNTER — Encounter: Payer: Self-pay | Admitting: Gastroenterology

## 2019-06-27 ENCOUNTER — Ambulatory Visit (AMBULATORY_SURGERY_CENTER): Payer: 59 | Admitting: Gastroenterology

## 2019-06-27 ENCOUNTER — Other Ambulatory Visit: Payer: Self-pay

## 2019-06-27 VITALS — BP 124/74 | HR 72 | Temp 97.7°F | Resp 18 | Ht 65.0 in | Wt 169.0 lb

## 2019-06-27 DIAGNOSIS — R1310 Dysphagia, unspecified: Secondary | ICD-10-CM | POA: Diagnosis not present

## 2019-06-27 DIAGNOSIS — K219 Gastro-esophageal reflux disease without esophagitis: Secondary | ICD-10-CM | POA: Diagnosis present

## 2019-06-27 DIAGNOSIS — K2270 Barrett's esophagus without dysplasia: Secondary | ICD-10-CM | POA: Diagnosis not present

## 2019-06-27 MED ORDER — SODIUM CHLORIDE 0.9 % IV SOLN: 500.0000 mL | Freq: Once | INTRAVENOUS | Status: DC

## 2019-06-27 NOTE — Patient Instructions (Signed)
° °  Await biopsy results  ° ° ° °YOU HAD AN ENDOSCOPIC PROCEDURE TODAY AT THE [facility]:   Refer to the procedure report that was given to you for any specific questions about what was found during the examination.  If the procedure report does not answer your questions, please call your gastroenterologist to clarify.  If you requested that your care partner not be given the details of your procedure findings, then the procedure report has been included in a sealed envelope for you to review at your convenience later. ° °YOU SHOULD EXPECT: Some feelings of bloating in the abdomen. Passage of more gas than usual.  Walking can help get rid of the air that was put into your GI tract during the procedure and reduce the bloating. If you had a lower endoscopy (such as a colonoscopy or flexible sigmoidoscopy) you may notice spotting of blood in your stool or on the toilet paper. If you underwent a bowel prep for your procedure, you may not have a normal bowel movement for a few days. ° °Please Note:  You might notice some irritation and congestion in your nose or some drainage.  This is from the oxygen used during your procedure.  There is no need for concern and it should clear up in a day or so. ° °SYMPTOMS TO REPORT IMMEDIATELY: ° ° °Following upper endoscopy (EGD) ° Vomiting of blood or coffee ground material ° New chest pain or pain under the shoulder blades ° Painful or persistently difficult swallowing ° New shortness of breath ° Fever of 100°F or higher ° Black, tarry-looking stools ° °For urgent or emergent issues, a gastroenterologist can be reached at any hour by calling [phone]. °Do not use MyChart messaging for urgent concerns.  ° ° °DIET:  We do recommend a small meal at first, but then you may proceed to your regular diet.  Drink plenty of fluids but you should avoid alcoholic beverages for 24 hours. ° °ACTIVITY:  You should plan to take it easy for the rest of today and you should NOT  DRIVE or use heavy machinery until tomorrow (because of the sedation medicines used during the test).   ° °FOLLOW UP: °Our staff will call the number listed on your records 48-72 hours following your procedure to check on you and address any questions or concerns that you may have regarding the information given to you following your procedure. If we do not reach you, we will leave a message.  We will attempt to reach you two times.  During this call, we will ask if you have developed any symptoms of COVID 19. If you develop any symptoms (ie: fever, flu-like symptoms, shortness of breath, cough etc.) before then, please call [phone].  If you test positive for Covid 19 in the 2 weeks post procedure, please call and report this information to us.   ° °If any biopsies were taken you will be contacted by phone or by letter within the next 1-3 weeks.  Please call us at [phone] if you have not heard about the biopsies in 3 weeks.  ° ° °SIGNATURES/CONFIDENTIALITY: °You and/or your care partner have signed paperwork which will be entered into your electronic medical record.  These signatures attest to the fact that that the information above on your After Visit Summary has been reviewed and is understood.  Full responsibility of the confidentiality of this discharge information lies with you and/or your care-partner.  °

## 2019-06-27 NOTE — Progress Notes (Signed)
Temp LC V/S CW 

## 2019-06-27 NOTE — Progress Notes (Signed)
Called to room to assist during endoscopic procedure.  Patient ID and intended procedure confirmed with present staff. Received instructions for my participation in the procedure from the performing physician.  

## 2019-06-27 NOTE — Op Note (Signed)
[name] Patient Name: Carolyn Cooper Procedure Date: 06/27/2019 9:51 AM MRN: [id number] Endoscopist: Mauri Pole , MD Age: 64 Referring MD:  Date of Birth: Apr 30, 1955 Gender: Female Account #: [id number] Procedure:                Upper GI endoscopy Indications:              Dysphagia, Esophageal reflux symptoms that persist                            despite appropriate therapy Medicines:                Monitored Anesthesia Care Procedure:                Pre-Anesthesia Assessment:                           - Prior to the procedure, a History and Physical                            was performed, and patient medications and                            allergies were reviewed. The patient's tolerance of                            previous anesthesia was also reviewed. The risks                            and benefits of the procedure and the sedation                            options and risks were discussed with the patient.                            All questions were answered, and informed consent                            was obtained. Prior Anticoagulants: The patient has                            taken no previous anticoagulant or antiplatelet                            agents. ASA Grade Assessment: II - A patient with                            mild systemic disease. After reviewing the risks                            and benefits, the patient was deemed in                            satisfactory condition to undergo the procedure.  After obtaining informed consent, the endoscope was                            passed under direct vision. Throughout the                            procedure, the patient's blood pressure, pulse, and                            oxygen saturations were monitored continuously. The                            Endoscope was introduced through the mouth, and                            advanced to the second  part of duodenum. The upper                            GI endoscopy was accomplished without difficulty.                            The patient tolerated the procedure well. Scope In: Scope Out: Findings:                 No endoscopic abnormality was evident in the                            esophagus to explain the patient's complaint of                            dysphagia. Biopsies were obtained from the proximal                            and distal esophagus with cold forceps for                            histology of suspected eosinophilic esophagitis.                           The gastroesophageal flap valve was visualized                            endoscopically and classified as Hill Grade II                            (fold present, opens with respiration).                           The stomach was normal.                           The examined duodenum was normal. Complications:            No immediate complications. Estimated Blood Loss:     Estimated blood loss was minimal. Impression:               -  No endoscopic esophageal abnormality to explain                            patient's dysphagia. Biopsied.                           - Gastroesophageal flap valve classified as Hill                            Grade II (fold present, opens with respiration).                           - Normal stomach.                           - Normal examined duodenum. Recommendation:           - Patient has a contact number available for                            emergencies. The signs and symptoms of potential                            delayed complications were discussed with the                            patient. Return to normal activities tomorrow.                            Written discharge instructions were provided to the                            patient.                           - Resume previous diet.                           - Continue present medications.                            - Await pathology results. Mauri Pole, MD 06/27/2019 10:09:15 AM This report has been signed electronically.

## 2019-06-28 ENCOUNTER — Other Ambulatory Visit: Payer: Self-pay | Admitting: Physician Assistant

## 2019-06-28 DIAGNOSIS — G43809 Other migraine, not intractable, without status migrainosus: Secondary | ICD-10-CM

## 2019-06-28 NOTE — Telephone Encounter (Signed)
Last office visit for migraines 08/01/2018 Last refill 05/30/2019, #10, no refills

## 2019-06-29 ENCOUNTER — Telehealth: Payer: Self-pay | Admitting: *Deleted

## 2019-06-29 ENCOUNTER — Encounter: Payer: Self-pay | Admitting: Gastroenterology

## 2019-06-29 NOTE — Telephone Encounter (Signed)
1. Have you developed a fever since your procedure? no  2.   Have you had an respiratory symptoms (SOB or cough) since your procedure? no  3.   Have you tested positive for COVID 19 since your procedure no  4.   Have you had any family members/close contacts diagnosed with the COVID 19 since your procedure?  no   If yes to any of these questions please route to Joylene John, RN and Erenest Rasher, RN Follow up Call-  Call back number 06/27/2019 10/24/2017  Post procedure Call Back phone  # [phone]  Permission to leave phone message Yes Yes  Some recent data might be hidden     Patient questions:  Do you have a fever, pain , or abdominal swelling? No. Pain Score  0 *  Have you tolerated food without any problems? Yes.    Have you been able to return to your normal activities? Yes.    Do you have any questions about your discharge instructions: Diet   No. Medications  No. Follow up visit  No.  Do you have questions or concerns about your Care? No.  Actions: * If pain score is 4 or above: No action needed, pain <4.

## 2019-06-29 NOTE — Telephone Encounter (Signed)
Left message on f/u call 

## 2019-07-09 ENCOUNTER — Encounter (HOSPITAL_COMMUNITY): Payer: Self-pay

## 2019-07-09 ENCOUNTER — Emergency Department (HOSPITAL_COMMUNITY)
Admission: EM | Admit: 2019-07-09 | Discharge: 2019-07-09 | Disposition: A | Discharge status: Home / Self Care | Payer: 59 | Attending: Emergency Medicine | Admitting: Emergency Medicine

## 2019-07-09 ENCOUNTER — Other Ambulatory Visit: Payer: Self-pay

## 2019-07-09 DIAGNOSIS — R1013 Epigastric pain: Secondary | ICD-10-CM | POA: Insufficient documentation

## 2019-07-09 DIAGNOSIS — Z79899 Other long term (current) drug therapy: Secondary | ICD-10-CM | POA: Diagnosis not present

## 2019-07-09 DIAGNOSIS — R112 Nausea with vomiting, unspecified: Secondary | ICD-10-CM | POA: Diagnosis present

## 2019-07-09 DIAGNOSIS — E860 Dehydration: Secondary | ICD-10-CM | POA: Diagnosis not present

## 2019-07-09 DIAGNOSIS — R42 Dizziness and giddiness: Secondary | ICD-10-CM | POA: Insufficient documentation

## 2019-07-09 LAB — LIPASE, BLOOD: Lipase: 18 U/L (ref 11–51)

## 2019-07-09 LAB — COMPREHENSIVE METABOLIC PANEL
ALT: 27 U/L (ref 0–44)
AST: 29 U/L (ref 15–41)
Albumin: 4.1 g/dL (ref 3.5–5.0)
Alkaline Phosphatase: 91 U/L (ref 38–126)
Anion gap: 10 (ref 5–15)
BUN: 19 mg/dL (ref 8–23)
CO2: 23 mmol/L (ref 22–32)
Calcium: 8.8 mg/dL — ABNORMAL LOW (ref 8.9–10.3)
Chloride: 105 mmol/L (ref 98–111)
Creatinine, Ser: 0.87 mg/dL (ref 0.44–1.00)
GFR calc Af Amer: >60 mL/min (ref >60)
GFR calc non Af Amer: >60 mL/min (ref >60)
Glucose, Bld: 165 mg/dL — ABNORMAL HIGH (ref 70–99)
Potassium: 3.7 mmol/L (ref 3.5–5.1)
Sodium: 138 mmol/L (ref 135–145)
Total Bilirubin: 1.1 mg/dL (ref 0.3–1.2)
Total Protein: 7.2 g/dL (ref 6.5–8.1)

## 2019-07-09 LAB — CBC
HCT: 42.3 % (ref 36.0–46.0)
Hemoglobin: 14.3 g/dL (ref 12.0–15.0)
MCH: 32.3 pg (ref 26.0–34.0)
MCHC: 33.8 g/dL (ref 30.0–36.0)
MCV: 95.5 fL (ref 80.0–100.0)
Platelets: 237 10*3/uL (ref 150–400)
RBC: 4.43 MIL/uL (ref 3.87–5.11)
RDW: 12.1 % (ref 11.5–15.5)
WBC: 11.7 10*3/uL — ABNORMAL HIGH (ref 4.0–10.5)
nRBC: 0 % (ref 0.0–0.2)

## 2019-07-09 LAB — LACTIC ACID, PLASMA
Lactic Acid, Venous: 2.3 mmol/L (ref 0.5–1.9)
Lactic Acid, Venous: 2 mmol/L (ref 0.5–1.9)

## 2019-07-09 MED ORDER — PROMETHAZINE HCL 25 MG RE SUPP: 25.0000 mg | Freq: Four times a day (QID) | RECTAL | 0 refills | Status: DC | PRN

## 2019-07-09 MED ORDER — SODIUM CHLORIDE 0.9 % IV BOLUS
1000.0000 mL | Freq: Once | INTRAVENOUS | Status: AC
  Administered 2019-07-09: 1000 mL via INTRAVENOUS

## 2019-07-09 MED ORDER — METOCLOPRAMIDE HCL 5 MG/ML IJ SOLN
10.0000 mg | Freq: Once | INTRAMUSCULAR | Status: AC
  Administered 2019-07-09: 10 mg via INTRAVENOUS
  Filled 2019-07-09: qty 2

## 2019-07-09 NOTE — Discharge Instructions (Addendum)
Drink plenty of fluids (clear liquids) then start a bland diet later this morning such as toast, crackers, jello, [employer] chicken noodle soup. Use the zofran for nausea or vomiting.  Use the Phenergan suppositories if needed if the nausea or vomiting returns.  Recheck if you get dehydrated again.

## 2019-07-09 NOTE — ED Notes (Signed)
edp in room  

## 2019-07-09 NOTE — ED Notes (Addendum)
Date and time results received: 07/09/19 3:40 AM  (use smartphrase ".now" to insert current time)  Test: lactic Critical Value: 2.3  Name of Provider Notified: knapp   Orders Received? Or Actions Taken?:

## 2019-07-09 NOTE — ED Provider Notes (Signed)
[facility] EMERGENCY DEPARTMENT Provider Note   CSN: [id number] Arrival date & time: 07/09/19  [id number]   Time seen 2:55 AM  History Chief Complaint  Patient presents with  . Abdominal Pain  . Nausea    Carolyn Cooper is a 64 y.o. female.  HPI   Patient states she was fine until about 9 PM when she had acute onset of nausea and vomiting and states she has vomited about 12 times without hematemesis.  She denies abdominal pain but states she has nausea and aching in her epigastric area.  She states her legs are aching.  She denies diarrhea but sometimes she feels the need that she may have to.  She denies fever.  She states she feels like her mouth is dry she feels dizzy and lightheaded.  She denies prior history of gastrointestinal issues.  She states she is now having dry heaves.  She denies eating anything that she thinks is made her sick or being around anybody else who is sick.  PCP Terald Sleeper, PA-C   Past Medical History:  Diagnosis Date  . Allergy    seasonal  . Cataract    had surgery  . Depression   . GERD (gastroesophageal reflux disease)   . IBS (irritable bowel syndrome)   . Migraines   . Osteopenia 2019   T score -1.5 FRAX 8% / 0.7%    Patient Active Problem List   Diagnosis Date Noted  . Bursitis of both hips 08/01/2018  . Anxiety 10/17/2017  . Weight gain 10/17/2017  . Microscopic hematuria 09/13/2015  . Vitamin D deficiency 09/11/2015  . Knee MCL sprain 04/04/2015  . Nonallopathic lesion of sacral region 01/10/2015  . Nonallopathic lesion of lumbosacral region 01/10/2015  . Nonallopathic lesion of thoracic region 01/10/2015  . SI (sacroiliac) joint dysfunction 12/19/2014  . Osteopenia 01/30/2014  . Migraine headache 01/30/2014    Past Surgical History:  Procedure Laterality Date  . CATARACT EXTRACTION, BILATERAL  2018  . CERVICAL CONE BIOPSY  1996  . COLONOSCOPY  07/21/2007  . CYSTOSCOPY    . MOUTH SURGERY  1978   WISDOM TEETH     OB  History    Gravida  4   Para  4   Term      Preterm      AB      Living  4     SAB      TAB      Ectopic      Multiple      Live Births              Family History  Problem Relation Age of Onset  . Ovarian cancer Maternal Grandmother   . Diverticulitis Mother   . Heart disease Father   . Kidney cancer Father   . Breast cancer Maternal Aunt 56  . Stomach cancer Maternal Grandfather   . Colon cancer Neg Hx   . Esophageal cancer Neg Hx   . Rectal cancer Neg Hx     Social History   Tobacco Use  . Smoking status: Never Smoker  . Smokeless tobacco: Never Used  Substance Use Topics  . Alcohol use: Yes    Alcohol/week: 0.0 standard drinks    Comment: occassional  . Drug use: No  lives with spouse  Home Medications Prior to Admission medications   Medication Sig Start Date End Date Taking? Authorizing Provider  calcium carbonate (OS-CAL) 600 MG TABS Take 600 mg by mouth  2 (two) times daily with a meal.    [provider]  Cholecalciferol (VITAMIN D) 400 UNITS capsule Take 800 Units by mouth daily.      [provider]  eletriptan (RELPAX) 40 MG tablet TAKE 1 TABLET (40 MG TOTAL) BY MOUTH AS NEEDED FOR MIGRAINE OR HEADACHE. MAY REPEAT X1 IN 24HOURS 03/27/19   Terald Sleeper, PA-C  escitalopram (LEXAPRO) 20 MG tablet Take 1 tablet (20 mg total) by mouth daily. 08/01/18   Terald Sleeper, PA-C  hyoscyamine (LEVSIN SL) 0.125 MG SL tablet TAKE I CAPSULE BY MOUTH DAILY AS NEEDED 05/29/19   Mauri Pole, MD  meloxicam (MOBIC) 15 MG tablet TAKE 1 TABLET BY MOUTH EVERY DAY 02/16/19   Terald Sleeper, PA-C  naratriptan (AMERGE) 2.5 MG tablet TAKE 1 TABLET AT ONSET OF MIGRAINE, MAY REPEAT AFTER 4 HRS IF NO RELIEF OR HEADACHE RETURB-MAX 5 IN 24 HRS 04/25/19   Terald Sleeper, PA-C  omeprazole (PRILOSEC) 40 MG capsule Take 1 capsule (40 mg total) by mouth daily. 05/07/19   Mauri Pole, MD  promethazine (PHENERGAN) 25 MG suppository Place 1  suppository (25 mg total) rectally every 6 (six) hours as needed for nausea or vomiting. 07/09/19   Rolland Porter, MD  rizatriptan (MAXALT) 10 MG tablet TAKE 1 TABLET AS NEEDED FOR MIGRAINE, MAY REPEAT IN TWO HOURS IF NEEDED. MAX OF 2 DOSES PER 24 HOURS 03/20/19   Terald Sleeper, PA-C  SUMAtriptan (IMITREX) 100 MG tablet TAKE 1 TAB BY MOUTH EVERY 2 HOURS AS NEEDED MAY REPEAT IN 2 HOURS IF HEADACHE PERSISTS 06/28/19   Dettinger, Fransisca Kaufmann, MD  zolmitriptan (ZOMIG) 5 MG tablet TAKE 1/2 TO 1 TABLETS AS NEEDED FOR MIGRAINE.MAY REPEAT IN 2 HOURS IF NEEDED.MAX 10MG  DAILY 01/22/19   Terald Sleeper, PA-C    Allergies    Sulfa antibiotics  Review of Systems   Review of Systems  All other systems reviewed and are negative.   Physical Exam Updated Vital Signs BP 113/87   Pulse 91   Temp 98 F (36.7 C)   Resp 20   Ht 5\' 5"  (1.651 m)   Wt 76.7 kg   SpO2 100%   BMI 28.12 kg/m   Physical Exam Vitals and nursing note reviewed.  Constitutional:      General: She is not in acute distress.    Appearance: Normal appearance. She is well-developed. She is not ill-appearing or toxic-appearing.  HENT:     Head: Normocephalic and atraumatic.     Right Ear: External ear normal.     Left Ear: External ear normal.     Nose: Nose normal. No mucosal edema or rhinorrhea.     Mouth/Throat:     Mouth: Mucous membranes are dry.     Dentition: No dental abscesses.     Pharynx: No uvula swelling.  Eyes:     Extraocular Movements: Extraocular movements intact.     Conjunctiva/sclera: Conjunctivae normal.     Pupils: Pupils are equal, round, and reactive to light.  Cardiovascular:     Rate and Rhythm: Normal rate and regular rhythm.     Heart sounds: Normal heart sounds. No murmur. No friction rub. No gallop.   Pulmonary:     Effort: Pulmonary effort is normal. No respiratory distress.     Breath sounds: Normal breath sounds. No wheezing, rhonchi or rales.  Chest:     Chest wall: No tenderness or crepitus.    Abdominal:  General: Abdomen is flat. Bowel sounds are normal. There is no distension.     Palpations: Abdomen is soft.     Tenderness: There is no abdominal tenderness. There is no guarding or rebound.  Musculoskeletal:        General: No tenderness. Normal range of motion.     Cervical back: Full passive range of motion without pain, normal range of motion and neck supple.     Comments: Moves all extremities well.   Skin:    General: Skin is warm and dry.     Coloration: Skin is not pale.     Findings: No erythema or rash.  Neurological:     General: No focal deficit present.     Mental Status: She is alert and oriented to person, place, and time.     Cranial Nerves: No cranial nerve deficit.  Psychiatric:        Mood and Affect: Mood is anxious.        Speech: Speech normal.        Behavior: Behavior normal.        Cognition and Memory: Abnormal remote memory:      ED Results / Procedures / Treatments   Labs (all labs ordered are listed, but only abnormal results are displayed) Results for orders placed or performed during the hospital encounter of 07/09/19  Lipase, blood  Result Value Ref Range   Lipase 18 11 - 51 U/L  Comprehensive metabolic panel  Result Value Ref Range   Sodium 138 135 - 145 mmol/L   Potassium 3.7 3.5 - 5.1 mmol/L   Chloride 105 98 - 111 mmol/L   CO2 23 22 - 32 mmol/L   Glucose, Bld 165 (H) 70 - 99 mg/dL   BUN 19 8 - 23 mg/dL   Creatinine, Ser 0.87 0.44 - 1.00 mg/dL   Calcium 8.8 (L) 8.9 - 10.3 mg/dL   Total Protein 7.2 6.5 - 8.1 g/dL   Albumin 4.1 3.5 - 5.0 g/dL   AST 29 15 - 41 U/L   ALT 27 0 - 44 U/L   Alkaline Phosphatase 91 38 - 126 U/L   Total Bilirubin 1.1 0.3 - 1.2 mg/dL   GFR calc non Af Amer >60 >60 mL/min   GFR calc Af Amer >60 >60 mL/min   Anion gap 10 5 - 15  CBC  Result Value Ref Range   WBC 11.7 (H) 4.0 - 10.5 K/uL   RBC 4.43 3.87 - 5.11 MIL/uL   Hemoglobin 14.3 12.0 - 15.0 g/dL   HCT 42.3 36.0 - 46.0 %   MCV 95.5  80.0 - 100.0 fL   MCH 32.3 26.0 - 34.0 pg   MCHC 33.8 30.0 - 36.0 g/dL   RDW 12.1 11.5 - 15.5 %   Platelets 237 150 - 400 K/uL   nRBC 0.0 0.0 - 0.2 %  Lactic acid, plasma  Result Value Ref Range   Lactic Acid, Venous 2.3 (HH) 0.5 - 1.9 mmol/L   Laboratory interpretation all normal except mild elevation of lactic acid, leukocytosis   EKG None  Radiology No results found.  Procedures Procedures (including critical care time)  Medications Ordered in ED Medications  sodium chloride 0.9 % bolus 1,000 mL (0 mLs Intravenous Stopped 07/09/19 0531)  sodium chloride 0.9 % bolus 1,000 mL (0 mLs Intravenous Stopped 07/09/19 0531)  metoCLOPramide (REGLAN) injection 10 mg (10 mg Intravenous Given 07/09/19 0321)    ED Course  I have reviewed the triage vital  signs and the nursing notes.  Pertinent labs & imaging results that were available during my care of the patient were reviewed by me and considered in my medical decision making (see chart for details).    MDM Rules/Calculators/A&P                      Patient had been given Zofran by EMS.  She states she still nauseated.  She was given Reglan IV and IV fluids for her dehydration.   Recheck at 4:20 AM husband is at bedside.  Her IV is not running very well, I have showed him how to watch it to make sure it is running because it is positional.  Patient states her nausea is gone.  Recheck at 5:40 AM patient's IVs have run in.  She states she feels better.  She has had good urinary output.  She has been able to do ice chips at the bedside.  She does not want to try oral fluid challenge.  She feels good enough however to be discharged home.  Final Clinical Impression(s) / ED Diagnoses Final diagnoses:  Non-intractable vomiting with nausea, unspecified vomiting type  Dehydration    Rx / DC Orders ED Discharge Orders         Ordered    promethazine (PHENERGAN) 25 MG suppository  Every 6 hours PRN     07/09/19 0548          Plan  discharge  Rolland Porter, MD, Barbette Or, MD 07/09/19 [phone]

## 2019-07-09 NOTE — ED Notes (Signed)
Pt asleep.

## 2019-07-09 NOTE — ED Triage Notes (Addendum)
Pt c/o n/v with abd pain since 7pm. Ems gave 4mg  zofran iv in 20 l ac. 5/10 pain abdomen. Now just nausea. Also says her RLS is acting up

## 2019-07-10 ENCOUNTER — Other Ambulatory Visit: Payer: Self-pay | Admitting: Family Medicine

## 2019-07-19 ENCOUNTER — Other Ambulatory Visit: Payer: Self-pay | Admitting: Family Medicine

## 2019-07-29 ENCOUNTER — Other Ambulatory Visit: Payer: Self-pay | Admitting: Gastroenterology

## 2019-08-03 ENCOUNTER — Other Ambulatory Visit: Payer: Self-pay | Admitting: Family Medicine

## 2019-08-03 DIAGNOSIS — G43809 Other migraine, not intractable, without status migrainosus: Secondary | ICD-10-CM

## 2019-08-20 ENCOUNTER — Other Ambulatory Visit: Payer: Self-pay | Admitting: *Deleted

## 2019-08-20 DIAGNOSIS — M7071 Other bursitis of hip, right hip: Secondary | ICD-10-CM

## 2019-08-20 DIAGNOSIS — M533 Sacrococcygeal disorders, not elsewhere classified: Secondary | ICD-10-CM

## 2019-08-20 MED ORDER — MELOXICAM 15 MG PO TABS: 15.0000 mg | ORAL_TABLET | Freq: Every day | ORAL | 1 refills | Status: DC

## 2019-08-29 ENCOUNTER — Other Ambulatory Visit: Payer: Self-pay | Admitting: Family Medicine

## 2019-08-30 ENCOUNTER — Other Ambulatory Visit: Payer: Self-pay | Admitting: *Deleted

## 2019-08-30 DIAGNOSIS — F419 Anxiety disorder, unspecified: Secondary | ICD-10-CM

## 2019-08-30 DIAGNOSIS — G43809 Other migraine, not intractable, without status migrainosus: Secondary | ICD-10-CM

## 2019-08-30 MED ORDER — ZOLMITRIPTAN 5 MG PO TABS: ORAL_TABLET | ORAL | 4 refills | Status: DC

## 2019-08-30 MED ORDER — ESCITALOPRAM OXALATE 20 MG PO TABS: 20.0000 mg | ORAL_TABLET | Freq: Every day | ORAL | 3 refills | Status: DC

## 2019-09-11 ENCOUNTER — Other Ambulatory Visit: Payer: Self-pay | Admitting: Family Medicine

## 2019-09-11 DIAGNOSIS — G43809 Other migraine, not intractable, without status migrainosus: Secondary | ICD-10-CM

## 2019-10-01 ENCOUNTER — Encounter: Payer: Self-pay | Admitting: Family Medicine

## 2019-10-01 ENCOUNTER — Other Ambulatory Visit: Payer: Self-pay

## 2019-10-01 ENCOUNTER — Ambulatory Visit: Payer: 59 | Admitting: Family Medicine

## 2019-10-01 VITALS — BP 127/66 | HR 76 | Temp 97.1°F | Ht 65.0 in | Wt 174.6 lb

## 2019-10-01 DIAGNOSIS — G43709 Chronic migraine without aura, not intractable, without status migrainosus: Secondary | ICD-10-CM | POA: Diagnosis not present

## 2019-10-01 MED ORDER — NURTEC 75 MG PO TBDP: 1.0000 | ORAL_TABLET | Freq: Every day | ORAL | 0 refills | Status: DC | PRN

## 2019-10-01 NOTE — Progress Notes (Signed)
Subjective: CC:est care, Migraine headaches PCP: Carolyn Norlander, DO Carolyn Cooper is a 64 y.o. female presenting to clinic today for:  1.  Migraine headaches Patient reports onset in her teens.  She is seen various neurologists including one at [facility].  She has failed beta-blockers, amitriptyline, Aimovig, etc.  She has migraine headaches several days per week and as a result uses triptans fairly regularly.  Apparently her previous PCP had her on Relpax, Amerge, Maxalt, Imitrex and Zomig.  She continues to take all of these for migraine headaches.  She used to be seen by Dr. Orlene Och and apparently he is the one that put her on this regimen.  She was seen by Dr. Rexene Alberts recently for some unusual numbness and tingling and had a MRI of her brain which showed no abnormal findings.  Apparently her migraine headaches had improved substantially at one point with CBD oils.  However, after she went to the chiropractor and she was adjusted headaches returned.  She denies any associated nausea, vomiting with migraine headaches.  She describes it as a knifelike sensation that starts at the back of her right head and sometimes will go through her left eye.  Rarely does she have any photophobia or phonophobia.  She is interested in trialing Nurtec.   ROS: Per HPI  Allergies  Allergen Reactions  . Sulfa Antibiotics Hives   Past Medical History:  Diagnosis Date  . Allergy    seasonal  . Barrett's esophagus   . Cataract    had surgery  . Depression   . GERD (gastroesophageal reflux disease)   . IBS (irritable bowel syndrome)   . Migraines   . Osteopenia 2019   T score -1.5 FRAX 8% / 0.7%    Current Outpatient Medications:  .  calcium carbonate (OS-CAL) 600 MG TABS, Take 600 mg by mouth 2 (two) times daily with a meal., Disp: , Rfl:  .  Cholecalciferol (VITAMIN D) 400 UNITS capsule, Take 800 Units by mouth daily.  , Disp: , Rfl:  .  eletriptan (RELPAX) 40 MG  tablet, TAKE 1 TABLET (40 MG TOTAL) BY MOUTH AS NEEDED FOR MIGRAINE OR HEADACHE. MAY REPEAT X1 IN 24HOURS, Disp: 10 tablet, Rfl: 6 .  escitalopram (LEXAPRO) 20 MG tablet, Take 1 tablet (20 mg total) by mouth daily., Disp: 90 tablet, Rfl: 3 .  hyoscyamine (LEVSIN SL) 0.125 MG SL tablet, TAKE I CAPSULE BY MOUTH DAILY AS NEEDED, Disp: 30 tablet, Rfl: 1 .  meloxicam (MOBIC) 15 MG tablet, Take 1 tablet (15 mg total) by mouth daily., Disp: 30 tablet, Rfl: 1 .  naratriptan (AMERGE) 2.5 MG tablet, TAKE 1 TABLET AT ONSET OF MIGRAINE, MAY REPEAT AFTER 4 HRS IF NO RELIEF OR HEADACHE RETURNS-MAX 5 IN 24 HRS, Disp: 10 tablet, Rfl: 1 .  omeprazole (PRILOSEC) 40 MG capsule, TAKE 1 CAPSULE BY MOUTH EVERY DAY, Disp: 90 capsule, Rfl: 1 .  promethazine (PHENERGAN) 25 MG suppository, Place 1 suppository (25 mg total) rectally every 6 (six) hours as needed for nausea or vomiting., Disp: 6 suppository, Rfl: 0 .  rizatriptan (MAXALT) 10 MG tablet, TAKE 1 TABLET AS NEEDED FOR MIGRAINE, MAY REPEAT IN TWO HOURS IF NEEDED. MAX OF 2 DOSES PER 24 HOURS, Disp: 9 tablet, Rfl: 3 .  SUMAtriptan (IMITREX) 100 MG tablet, TAKE 1 TAB BY MOUTH EVERY 2 HOURS AS NEEDED MAY REPEAT IN 2 HOURS IF HEADACHE PERSISTS, Disp: 10 tablet, Rfl: 0 .  zolmitriptan (ZOMIG) 5  MG tablet, Take 1 tablet at onset of migraine headache.  Repeat 1 time in 2 hours if needed.  No more than 2 tabs/ 24 hours or 4 tabs in 1 week., Disp: 10 tablet, Rfl: 4  Current Facility-Administered Medications:  .  0.9 %  sodium chloride infusion, 500 mL, Intravenous, Once, Nandigam, Venia Minks, MD Social History   Socioeconomic History  . Marital status: Married    Spouse name: Not on file  . Number of children: Not on file  . Years of education: Not on file  . Highest education level: Not on file  Occupational History  . Not on file  Tobacco Use  . Smoking status: Never Smoker  . Smokeless tobacco: Never Used  Vaping Use  . Vaping Use: Never used  Substance and  Sexual Activity  . Alcohol use: Yes    Alcohol/week: 0.0 standard drinks    Comment: occassional  . Drug use: No  . Sexual activity: Yes    Birth control/protection: Post-menopausal    Comment: 1st intercourse 64 yo-Fewer than 5 partners  Other Topics Concern  . Not on file  Social History Narrative  . Not on file   Social Determinants of Health   Financial Resource Strain:   . Difficulty of Paying Living Expenses:   Food Insecurity:   . Worried About Charity fundraiser in the Last Year:   . Arboriculturist in the Last Year:   Transportation Needs:   . Film/video editor (Medical):   Marland Kitchen Lack of Transportation (Non-Medical):   Physical Activity:   . Days of Exercise per Week:   . Minutes of Exercise per Session:   Stress:   . Feeling of Stress :   Social Connections:   . Frequency of Communication with Friends and Family:   . Frequency of Social Gatherings with Friends and Family:   . Attends Religious Services:   . Active Member of Clubs or Organizations:   . Attends Archivist Meetings:   Marland Kitchen Marital Status:   Intimate Partner Violence:   . Fear of Current or Ex-Partner:   . Emotionally Abused:   Marland Kitchen Physically Abused:   . Sexually Abused:    Family History  Problem Relation Age of Onset  . Ovarian cancer Maternal Grandmother   . Diverticulitis Mother   . Heart disease Father   . Kidney cancer Father   . Breast cancer Maternal Aunt 43  . Stomach cancer Maternal Grandfather   . Colon cancer Neg Hx   . Esophageal cancer Neg Hx   . Rectal cancer Neg Hx     Objective: Office vital signs reviewed. BP 127/66   Pulse 76   Temp (!) 97.1 F (36.2 C)   Ht 5\' 5"  (1.651 m)   Wt 174 lb 9.6 oz (79.2 kg)   SpO2 98%   BMI 29.05 kg/m   Physical Examination:  General: Awake, alert, well nourished, No acute distress HEENT: Normal, EOMI, PERRLA.  Evidence of previous cataract surgery noted Cardio: regular rate  Pulm: Normal work of breathing on room  air Extremities: warm, well perfused, No edema, cyanosis or clubbing; +2 pulses bilaterally Neuro: No focal neurologic deficits.  Cranial nerves II through XII grossly intact.  No tremor  Assessment/ Plan: 64 y.o. female   1. Chronic migraine without aura without status migrainosus, not intractable Very unusual regimen that she is on.  One triptan alone increases risk of CVA but to be on 5 different triptans  is somewhat concerning.  I certainly agree that she has been through quite a few different medications and has failed this.  I wonder if there is something out there that might be of benefit.  I am going to reach out to the neurologist that saw her earlier this year to get their opinion on this as this regimen is outside of the scope of my practice.  I have come through the records and cannot find previous notes from Dr. Melton Alar.  I have given her a couple of samples of Nurtec and given her instructions on how to use these. - Rimegepant Sulfate (NURTEC) 75 MG TBDP; Take 1 tablet by mouth daily as needed (migraine).  Dispense: 4 tablet; Refill: 0   No orders of the defined types were placed in this encounter.  No orders of the defined types were placed in this encounter.    Carolyn Norlander, DO [place] [phone]

## 2019-10-03 ENCOUNTER — Telehealth: Payer: Self-pay | Admitting: Family Medicine

## 2019-10-03 ENCOUNTER — Other Ambulatory Visit: Payer: Self-pay | Admitting: Family Medicine

## 2019-10-03 DIAGNOSIS — G43709 Chronic migraine without aura, not intractable, without status migrainosus: Secondary | ICD-10-CM

## 2019-10-03 MED ORDER — NURTEC 75 MG PO TBDP: 1.0000 | ORAL_TABLET | Freq: Every day | ORAL | 12 refills | Status: DC | PRN

## 2019-10-03 NOTE — Telephone Encounter (Signed)
done

## 2019-10-03 NOTE — Telephone Encounter (Signed)
Pt called to let Dr Lajuana Ripple know that she took 1 of the Nurtec pills and said it helped.

## 2019-10-03 NOTE — Telephone Encounter (Signed)
See below, patient said you stated you would send in more if they helped.

## 2019-10-05 ENCOUNTER — Other Ambulatory Visit: Payer: Self-pay | Admitting: Family Medicine

## 2019-10-05 DIAGNOSIS — G43809 Other migraine, not intractable, without status migrainosus: Secondary | ICD-10-CM

## 2019-10-10 ENCOUNTER — Encounter: Payer: Self-pay | Admitting: Family Medicine

## 2019-10-19 ENCOUNTER — Other Ambulatory Visit: Payer: Self-pay | Admitting: Family Medicine

## 2019-10-19 DIAGNOSIS — Z1231 Encounter for screening mammogram for malignant neoplasm of breast: Secondary | ICD-10-CM

## 2019-10-23 ENCOUNTER — Other Ambulatory Visit: Payer: Self-pay | Admitting: Family Medicine

## 2019-10-23 NOTE — Telephone Encounter (Signed)
Please call and ask if Nurtec continues to work.  If so, we can increase the quantity of the Nurtec.  Will cc to [facility] as well to determine dose/schedule for preventative.  I think it was recently approved as such.  Will refill Maxalt but discontinue the sumatriptan and rizatriptan.  Neurology recommended AGAINST multiple triptans.

## 2019-10-23 NOTE — Telephone Encounter (Signed)
Yes, Nurtec has been approved for preventative treatment of migraines:   Recommended dosage for preventive treatment of episodic migraine: 75 mg taken orally every other day #15 tablets/per month supply  Will call patient to discuss as well as triptans

## 2019-10-23 NOTE — Telephone Encounter (Signed)
Left message for patient to call back to receive new RX instructions

## 2019-10-23 NOTE — Telephone Encounter (Signed)
Last OV 10/01/19  Her med list has both rizatriptan and sumatriptan wasn't sure if she is taking both?  Ok to refill?

## 2019-10-23 NOTE — Telephone Encounter (Signed)
I rx'd #15 last month.  Please inform of every other day use for prevention.

## 2019-10-23 NOTE — Telephone Encounter (Signed)
Ok to fill this? Sent refill request earlier about rizatriptan  Is she taking all the ones on her med list?

## 2019-10-24 ENCOUNTER — Telehealth: Payer: Self-pay | Admitting: Family Medicine

## 2019-10-24 ENCOUNTER — Encounter: Payer: Self-pay | Admitting: Pharmacist

## 2019-10-24 DIAGNOSIS — G43709 Chronic migraine without aura, not intractable, without status migrainosus: Secondary | ICD-10-CM

## 2019-10-24 NOTE — Telephone Encounter (Signed)
Previously has used CBD oil for migraines--it became ineffective after she starting seeing a chiropractor  Maxalt called in for abortive treatment per PCP  Removed 3 remaining triptans from chart  Should only be on one triptan for abortive treatment  Nurtec 75mg  ODT every other day for preventative treatment  Patient states her migraines have improved  Encouraged patient to reach out if needs arise

## 2019-10-30 MED ORDER — NURTEC 75 MG PO TBDP: 1.0000 | ORAL_TABLET | ORAL | 11 refills | Status: DC

## 2019-10-30 NOTE — Telephone Encounter (Signed)
Pt made aware

## 2019-10-30 NOTE — Addendum Note (Signed)
Addended by: Lottie Dawson D on: 10/30/2019 10:53 AM   Modules accepted: Orders

## 2019-10-30 NOTE — Telephone Encounter (Signed)
Please let patient know the following:  New RX for Nurtec ODT 75mg  every other day called in to pharmacy-copay #15 tablets for 30 days   Call placed to [facility] in [place], [place]  They are out of stock of Nurtec ODT so it will be 1-2 days before it is ready  Please have patient call before she picks up to ensure ready  Copay card online at [email or web address] if patient would like to use  We have samples if needed  Thank you!

## 2019-10-31 ENCOUNTER — Other Ambulatory Visit: Payer: Self-pay | Admitting: Family Medicine

## 2019-10-31 DIAGNOSIS — G43809 Other migraine, not intractable, without status migrainosus: Secondary | ICD-10-CM

## 2019-11-05 NOTE — Telephone Encounter (Signed)
Nurtec samples left up front for patient to pick up #8  FRE#[id number] EXP 10/22   #16 tabs was called in to [facility] pharmacy  (not covered on insurance since this is a new indication) #15 tabs covered, however [facility] would not break the box (come in #8 count boxes)--they reduced RX to #8 tabs  Will attempt to resolve issue at pharmacy

## 2019-11-13 ENCOUNTER — Other Ambulatory Visit: Payer: Self-pay

## 2019-11-13 ENCOUNTER — Ambulatory Visit (INDEPENDENT_AMBULATORY_CARE_PROVIDER_SITE_OTHER): Payer: 59 | Admitting: Physician Assistant

## 2019-11-13 ENCOUNTER — Encounter: Payer: Self-pay | Admitting: Physician Assistant

## 2019-11-13 DIAGNOSIS — D229 Melanocytic nevi, unspecified: Secondary | ICD-10-CM

## 2019-11-13 DIAGNOSIS — L578 Other skin changes due to chronic exposure to nonionizing radiation: Secondary | ICD-10-CM

## 2019-11-13 DIAGNOSIS — L821 Other seborrheic keratosis: Secondary | ICD-10-CM | POA: Diagnosis not present

## 2019-11-13 DIAGNOSIS — Z1283 Encounter for screening for malignant neoplasm of skin: Secondary | ICD-10-CM | POA: Diagnosis not present

## 2019-11-13 DIAGNOSIS — D1800 Hemangioma unspecified site: Secondary | ICD-10-CM

## 2019-11-13 DIAGNOSIS — D1801 Hemangioma of skin and subcutaneous tissue: Secondary | ICD-10-CM

## 2019-11-13 DIAGNOSIS — L814 Other melanin hyperpigmentation: Secondary | ICD-10-CM | POA: Diagnosis not present

## 2019-11-13 DIAGNOSIS — D485 Neoplasm of uncertain behavior of skin: Secondary | ICD-10-CM

## 2019-11-13 NOTE — Patient Instructions (Signed)

## 2019-11-13 NOTE — Progress Notes (Signed)
Follow-Up Visit   Subjective  Carolyn Cooper is a 64 y.o. female who presents for the following: Annual Exam (spots on forehead xmonths PREVIOUS [place]).   The following portions of the chart were reviewed this encounter and updated as appropriate: Tobacco  Allergies  Meds  Problems  Med Hx  Surg Hx  Fam Hx      Objective  Well appearing patient in no apparent distress; mood and affect are within normal limits.  A full examination was performed including scalp, head, eyes, ears, nose, lips, neck, chest, axillae, abdomen, back, buttocks, bilateral upper extremities, bilateral lower extremities, hands, feet, fingers, toes, fingernails, and toenails. All findings within normal limits unless otherwise noted below.  Objective  Head - to toe: No atypical nevi No signs of non-mole skin cancer.   Objective  mid forehead: Yellow papule with central umbilication     Objective  Right Forehead: Yellow papule with central umbilication     Objective  Left Abdomen (side) - Upper, Mid Back, Right Lower Back: Red paples   Assessment & Plan  Screening exam for skin cancer Head - to toe  Skin exam  Neoplasm of uncertain behavior of skin (2) mid forehead  Epidermal / dermal shaving  Lesion diameter (cm):  1 Informed consent: discussed and consent obtained   Timeout: patient name, date of birth, surgical site, and procedure verified   Procedure prep:  Patient was prepped and draped in usual sterile fashion Prep type:  Chlorhexidine Anesthesia: the lesion was anesthetized in a standard fashion   Anesthetic:  1% lidocaine w/ epinephrine 1-100,000 local infiltration Instrument used: DermaBlade   Hemostasis achieved with: aluminum chloride   Outcome: patient tolerated procedure well   Post-procedure details: sterile dressing applied and wound care instructions given   Dressing type: petrolatum gauze, petrolatum and bandage    Specimen 1 - Surgical pathology Differential  Diagnosis: [facility] Check Margins: No  Right Forehead  Epidermal / dermal shaving  Lesion diameter (cm):  1 Informed consent: discussed and consent obtained   Timeout: patient name, date of birth, surgical site, and procedure verified   Procedure prep:  Patient was prepped and draped in usual sterile fashion Prep type:  Chlorhexidine Anesthesia: the lesion was anesthetized in a standard fashion   Anesthetic:  1% lidocaine w/ epinephrine 1-100,000 local infiltration Instrument used: DermaBlade   Hemostasis achieved with: aluminum chloride   Outcome: patient tolerated procedure well   Post-procedure details: sterile dressing applied and wound care instructions given   Dressing type: petrolatum gauze, petrolatum and bandage    Specimen 2 - Surgical pathology Differential Diagnosis:  Check Margins: No  Hemangioma of skin (3) Left Abdomen (side) - Upper; Right Lower Back; Mid Back  observe   Lentigines - Scattered tan macules - Discussed due to sun exposure - Benign, observe - Call for any changes  Seborrheic Keratoses - Stuck-on, waxy, tan-brown papules and plaques  - Discussed benign etiology and prognosis. - Observe - Call for any changes  Melanocytic Nevi - Tan-brown and/or pink-flesh-colored symmetric macules and papules - Benign appearing on exam today - Observation - Call clinic for new or changing moles - Recommend daily use of broad spectrum spf 30+ sunscreen to sun-exposed areas.   Hemangiomas - Red papules - Discussed benign nature - Observe - Call for any changes  Actinic Damage - diffuse scaly erythematous macules with underlying dyspigmentation - Recommend daily broad spectrum sunscreen SPF 30+ to sun-exposed areas, reapply every 2 hours as needed.  -  Call for new or changing lesions.  Skin cancer screening performed today.   I, Brynna Dobos, PA-C, have reviewed all documentation's for this visit.  The documentation on 11/13/19 for the exam,  diagnosis, procedures and orders are all accurate and complete.

## 2019-12-08 ENCOUNTER — Other Ambulatory Visit: Payer: Self-pay | Admitting: Family Medicine

## 2019-12-08 DIAGNOSIS — M533 Sacrococcygeal disorders, not elsewhere classified: Secondary | ICD-10-CM

## 2019-12-08 DIAGNOSIS — M7072 Other bursitis of hip, left hip: Secondary | ICD-10-CM

## 2020-01-02 ENCOUNTER — Ambulatory Visit
Admission: RE | Admit: 2020-01-02 | Discharge: 2020-01-02 | Disposition: A | Discharge status: Home / Self Care | Payer: 59 | Source: Ambulatory Visit | Attending: Family Medicine | Admitting: Family Medicine

## 2020-01-02 ENCOUNTER — Other Ambulatory Visit: Payer: Self-pay

## 2020-01-02 DIAGNOSIS — Z1231 Encounter for screening mammogram for malignant neoplasm of breast: Secondary | ICD-10-CM

## 2020-01-04 ENCOUNTER — Other Ambulatory Visit: Payer: Self-pay

## 2020-01-04 DIAGNOSIS — C50919 Malignant neoplasm of unspecified site of unspecified female breast: Secondary | ICD-10-CM

## 2020-01-04 DIAGNOSIS — R928 Other abnormal and inconclusive findings on diagnostic imaging of breast: Secondary | ICD-10-CM

## 2020-01-04 HISTORY — DX: Malignant neoplasm of unspecified site of unspecified female breast: C50.919

## 2020-01-15 ENCOUNTER — Other Ambulatory Visit: Payer: Self-pay | Admitting: Family Medicine

## 2020-01-15 ENCOUNTER — Ambulatory Visit
Admission: RE | Admit: 2020-01-15 | Discharge: 2020-01-15 | Disposition: A | Discharge status: Home / Self Care | Payer: 59 | Source: Ambulatory Visit | Attending: Family Medicine | Admitting: Family Medicine

## 2020-01-15 ENCOUNTER — Other Ambulatory Visit: Payer: Self-pay

## 2020-01-15 DIAGNOSIS — R928 Other abnormal and inconclusive findings on diagnostic imaging of breast: Secondary | ICD-10-CM

## 2020-01-21 ENCOUNTER — Encounter: Payer: 59 | Admitting: Nurse Practitioner

## 2020-01-24 ENCOUNTER — Ambulatory Visit (INDEPENDENT_AMBULATORY_CARE_PROVIDER_SITE_OTHER): Payer: 59 | Admitting: Nurse Practitioner

## 2020-01-24 ENCOUNTER — Other Ambulatory Visit: Payer: Self-pay

## 2020-01-24 ENCOUNTER — Encounter: Payer: Self-pay | Admitting: Nurse Practitioner

## 2020-01-24 VITALS — BP 122/80 | Ht 65.5 in | Wt 175.0 lb

## 2020-01-24 DIAGNOSIS — Z23 Encounter for immunization: Secondary | ICD-10-CM

## 2020-01-24 DIAGNOSIS — M8580 Other specified disorders of bone density and structure, unspecified site: Secondary | ICD-10-CM | POA: Diagnosis not present

## 2020-01-24 DIAGNOSIS — Z01419 Encounter for gynecological examination (general) (routine) without abnormal findings: Secondary | ICD-10-CM

## 2020-01-24 DIAGNOSIS — Z13820 Encounter for screening for osteoporosis: Secondary | ICD-10-CM | POA: Diagnosis not present

## 2020-01-24 NOTE — Progress Notes (Signed)
   Carolyn Cooper 1955/04/20 [id number]   History:  63 y.o. G4P4 presents for annual exam. Postmenopausal - no HRT, no bleeding. 1996 cone biopsy, otherwise normal pap history. Left breast mass found on most recent mammogram, scheduled for biopsy 01/28/2020. Osteopenia.   Gynecologic History No LMP recorded. Patient is postmenopausal.   Contraception: post menopausal status Last Pap: 01/05/2018. Results were: normal Last mammogram: 01/15/2020. Results were: Left breast mass, biopsy scheduled next week Last colonoscopy: 10/24/2017. Results were: polyp Last Dexa: 05/24/2017. Results were: t-score -1.5, FRAX 8.1% / 0.7%  Past medical history, past surgical history, family history and social history were all reviewed and documented in the EPIC chart.  ROS:  A ROS was performed and pertinent positives and negatives are included.  Exam:  Vitals:   01/24/20 0922  BP: 122/80  Weight: 175 lb (79.4 kg)  Height: 5' 5.5" (1.664 m)   Body mass index is 28.68 kg/m.  General appearance:  Normal Thyroid:  Symmetrical, normal in size, without palpable masses or nodularity. Respiratory  Auscultation:  Clear without wheezing or rhonchi Cardiovascular  Auscultation:  Regular rate, without rubs, murmurs or gallops  Edema/varicosities:  Not grossly evident Abdominal  Soft,nontender, without masses, guarding or rebound.  Liver/spleen:  No organomegaly noted  Hernia:  None appreciated  Skin  Inspection:  Grossly normal   Breasts: Examined lying and sitting.   Right: Without masses, retractions, discharge or axillary adenopathy.   Left: Without masses, retractions, discharge or axillary adenopathy. Gentitourinary   Inguinal/mons:  Normal without inguinal adenopathy  External genitalia:  Normal  BUS/Urethra/Skene's glands:  Normal  Vagina:  Normal  Cervix:  Normal, scarring from cone biopsy  Uterus:  Anteverted, normal in size, shape and contour.  Midline and mobile  Adnexa/parametria:       Rt: Without masses or tenderness.   Lt: Without masses or tenderness.  Anus and perineum: Hemorrhoids, non-bleeding  Digital rectal exam: Normal sphincter tone without palpated masses or tenderness  Assessment/Plan:  64 y.o. G4P4 for annual exam.   Well woman exam with routine gynecological exam - Education provided on SBEs, importance of preventative screenings, current guidelines, high calcium diet, regular exercise, and multivitamin daily. Labs with PCP.   Osteopenia, unspecified location - Plan: DG Bone Density.  Taking daily vitamin D supplement.  Recommend regular exercise and incorporating weightbearing exercises.  Screening for osteoporosis - Plan: DG Bone Density  Screening for cervical cancer -1996 cone ciopsy, otherwise normal palpation.  Will repeat at 5-year interval.  Discussed options for stopping at age 33 and we will reassess on an annual basis.  Screening for breast cancer -mammogram 01/15/2020 showed left breast mass, scheduled for biopsy 01/28/2020.  Normal mammogram history prior.  Normal breast exam today.  Screening for colon cancer -up-to-date.  Will follow up at recommended interval.  Follow up in 1 year for annual.      Tamela [facility], 9:23 AM 01/24/2020

## 2020-01-24 NOTE — Addendum Note (Signed)
Addended by: Thamas Jaegers on: 01/24/2020 09:53 AM   Modules accepted: Orders

## 2020-01-24 NOTE — Patient Instructions (Signed)

## 2020-01-25 ENCOUNTER — Other Ambulatory Visit: Payer: Self-pay | Admitting: Family Medicine

## 2020-01-25 ENCOUNTER — Other Ambulatory Visit: Payer: Self-pay | Admitting: Gastroenterology

## 2020-01-25 DIAGNOSIS — G43809 Other migraine, not intractable, without status migrainosus: Secondary | ICD-10-CM

## 2020-01-28 ENCOUNTER — Other Ambulatory Visit: Payer: Self-pay

## 2020-01-28 ENCOUNTER — Ambulatory Visit
Admission: RE | Admit: 2020-01-28 | Discharge: 2020-01-28 | Disposition: A | Discharge status: Home / Self Care | Payer: 59 | Source: Ambulatory Visit | Attending: Family Medicine | Admitting: Family Medicine

## 2020-01-28 DIAGNOSIS — R928 Other abnormal and inconclusive findings on diagnostic imaging of breast: Secondary | ICD-10-CM

## 2020-01-30 ENCOUNTER — Encounter: Payer: Self-pay | Admitting: *Deleted

## 2020-01-30 ENCOUNTER — Telehealth: Payer: Self-pay | Admitting: *Deleted

## 2020-01-30 DIAGNOSIS — Z170 Estrogen receptor positive status [ER+]: Secondary | ICD-10-CM | POA: Insufficient documentation

## 2020-01-30 DIAGNOSIS — C50412 Malignant neoplasm of upper-outer quadrant of left female breast: Secondary | ICD-10-CM

## 2020-01-30 NOTE — Telephone Encounter (Signed)
Confirmed [facility] for 02/06/20 at 0815 .  Instructions and contact information given.

## 2020-02-06 ENCOUNTER — Ambulatory Visit (HOSPITAL_BASED_OUTPATIENT_CLINIC_OR_DEPARTMENT_OTHER): Payer: 59 | Admitting: Genetic Counselor

## 2020-02-06 ENCOUNTER — Encounter: Payer: Self-pay | Admitting: Physical Therapy

## 2020-02-06 ENCOUNTER — Ambulatory Visit: Payer: 59 | Attending: Surgery | Admitting: Physical Therapy

## 2020-02-06 ENCOUNTER — Other Ambulatory Visit: Payer: Self-pay | Admitting: *Deleted

## 2020-02-06 ENCOUNTER — Other Ambulatory Visit: Payer: Self-pay

## 2020-02-06 ENCOUNTER — Encounter: Payer: Self-pay | Admitting: Oncology

## 2020-02-06 ENCOUNTER — Encounter: Payer: Self-pay | Admitting: Genetic Counselor

## 2020-02-06 ENCOUNTER — Ambulatory Visit
Admission: RE | Admit: 2020-02-06 | Discharge: 2020-02-06 | Disposition: A | Discharge status: Home / Self Care | Payer: 59 | Source: Ambulatory Visit | Attending: Radiation Oncology | Admitting: Radiation Oncology

## 2020-02-06 ENCOUNTER — Inpatient Hospital Stay: Payer: 59 | Admitting: Licensed Clinical Social Worker

## 2020-02-06 ENCOUNTER — Ambulatory Visit: Payer: Self-pay | Admitting: Surgery

## 2020-02-06 ENCOUNTER — Encounter: Payer: Self-pay | Admitting: *Deleted

## 2020-02-06 ENCOUNTER — Inpatient Hospital Stay: Payer: 59 | Attending: Oncology | Admitting: Oncology

## 2020-02-06 ENCOUNTER — Inpatient Hospital Stay: Payer: 59

## 2020-02-06 VITALS — BP 123/62 | HR 68 | Temp 98.1°F | Resp 18 | Ht 65.5 in | Wt 180.1 lb

## 2020-02-06 DIAGNOSIS — C50912 Malignant neoplasm of unspecified site of left female breast: Secondary | ICD-10-CM

## 2020-02-06 DIAGNOSIS — Z800 Family history of malignant neoplasm of digestive organs: Secondary | ICD-10-CM | POA: Diagnosis not present

## 2020-02-06 DIAGNOSIS — Z803 Family history of malignant neoplasm of breast: Secondary | ICD-10-CM | POA: Diagnosis not present

## 2020-02-06 DIAGNOSIS — Z170 Estrogen receptor positive status [ER+]: Secondary | ICD-10-CM

## 2020-02-06 DIAGNOSIS — Z8041 Family history of malignant neoplasm of ovary: Secondary | ICD-10-CM | POA: Diagnosis not present

## 2020-02-06 DIAGNOSIS — C50412 Malignant neoplasm of upper-outer quadrant of left female breast: Secondary | ICD-10-CM

## 2020-02-06 DIAGNOSIS — Z8051 Family history of malignant neoplasm of kidney: Secondary | ICD-10-CM

## 2020-02-06 DIAGNOSIS — R293 Abnormal posture: Secondary | ICD-10-CM | POA: Diagnosis present

## 2020-02-06 LAB — CMP (CANCER CENTER ONLY)
ALT: 21 U/L (ref 0–44)
AST: 18 U/L (ref 15–41)
Albumin: 4 g/dL (ref 3.5–5.0)
Alkaline Phosphatase: 113 U/L (ref 38–126)
Anion gap: 8 (ref 5–15)
BUN: 13 mg/dL (ref 8–23)
CO2: 27 mmol/L (ref 22–32)
Calcium: 9.1 mg/dL (ref 8.9–10.3)
Chloride: 107 mmol/L (ref 98–111)
Creatinine: 0.88 mg/dL (ref 0.44–1.00)
GFR, Estimated: >60 mL/min (ref >60)
Glucose, Bld: 74 mg/dL (ref 70–99)
Potassium: 3.8 mmol/L (ref 3.5–5.1)
Sodium: 142 mmol/L (ref 135–145)
Total Bilirubin: 0.5 mg/dL (ref 0.3–1.2)
Total Protein: 7 g/dL (ref 6.5–8.1)

## 2020-02-06 LAB — CBC WITH DIFFERENTIAL (CANCER CENTER ONLY)
Abs Immature Granulocytes: 0.01 10*3/uL (ref 0.00–0.07)
Basophils Absolute: 0.1 10*3/uL (ref 0.0–0.1)
Basophils Relative: 1 %
Eosinophils Absolute: 0.1 10*3/uL (ref 0.0–0.5)
Eosinophils Relative: 2 %
HCT: 39.1 % (ref 36.0–46.0)
Hemoglobin: 13 g/dL (ref 12.0–15.0)
Immature Granulocytes: 0 %
Lymphocytes Relative: 22 %
Lymphs Abs: 1.3 10*3/uL (ref 0.7–4.0)
MCH: 32.3 pg (ref 26.0–34.0)
MCHC: 33.2 g/dL (ref 30.0–36.0)
MCV: 97 fL (ref 80.0–100.0)
Monocytes Absolute: 0.6 10*3/uL (ref 0.1–1.0)
Monocytes Relative: 10 %
Neutro Abs: 3.8 10*3/uL (ref 1.7–7.7)
Neutrophils Relative %: 65 %
Platelet Count: 241 10*3/uL (ref 150–400)
RBC: 4.03 MIL/uL (ref 3.87–5.11)
RDW: 12.4 % (ref 11.5–15.5)
WBC Count: 5.9 10*3/uL (ref 4.0–10.5)
nRBC: 0 % (ref 0.0–0.2)

## 2020-02-06 LAB — GENETIC SCREENING ORDER

## 2020-02-06 NOTE — Progress Notes (Signed)
[facility]  [phone] [phone]     ID: Carolyn Cooper DOB: [birth year]  MR#: [id number]  DDU#:[id number]  Patient Care Team: Janora Norlander, DO as PCP - General (Family Medicine) Mauro Kaufmann, RN as Oncology Nurse Navigator Rockwell Germany, RN as Oncology Nurse Navigator Fuad Forget, Virgie Dad, MD as Consulting Physician (Oncology) Kyung Rudd, MD as Consulting Physician (Radiation Oncology) Erroll Luna, MD as Consulting Physician (General Surgery) Tamela Gammon, NP as Nurse Practitioner (Gynecology) Mauri Pole, MD as Consulting Physician (Gastroenterology) Lavonna Monarch, MD as Consulting Physician (Dermatology) Chauncey Cruel, MD OTHER MD:  CHIEF COMPLAINT: Invasive lobular breast cancer  CURRENT TREATMENT: Awaiting definitive surgery  HISTORY OF CURRENT ILLNESS: "Carolyn Cooper" had routine screening mammography on 01/02/2020 showing a possible abnormality in the left breast. She underwent left diagnostic mammography with tomography and left breast ultrasonography at [facility] on 01/15/2020 showing: breast density category C; suspicious 3 mm mass in left breast at 2:30; no left lymphadenopathy.  Accordingly on 01/28/2020 she proceeded to biopsy of the left breast area in question. The pathology from this procedure ([id number]) showed: invasive and in situ mammary carcinoma, e-cadherin negative, grade 2. Prognostic indicators significant for: estrogen receptor, >95% positive with strong staining intensity and progesterone receptor, 20% positive with moderate staining intensity. Proliferation marker Ki67 at 10%. HER2 equivocal by immunohistochemistry (2+), but negative by fluorescent in situ hybridization with a signals ratio 1.52 and number per cell 3.5.  The patient's subsequent history is as detailed below.   INTERVAL HISTORY: Carolyn Cooper was evaluated in the multidisciplinary breast cancer clinic on 02/06/2020  accompanied by her husband Carolyn Cooper. Her case was also presented at the multidisciplinary breast cancer conference on the same day. At that time a preliminary plan was proposed: surgery after MRI, consider Oncotype if the tumor is greater than 1.0 cm, adjuvant radiation, antiestrogens   REVIEW OF SYSTEMS: There were no specific symptoms leading to the original mammogram, which was routinely scheduled. The patient denies unusual headaches, visual changes, nausea, vomiting, stiff neck, dizziness, or gait imbalance. There has been no cough, phlegm production, or pleurisy, no chest pain or pressure, and no change in bowel or bladder habits. The patient denies fever, rash, bleeding, unexplained fatigue or unexplained weight loss. A detailed review of systems was otherwise entirely negative.  COVID 19 VACCINATION STATUS: Status post 2 doses, booster pending  PAST MEDICAL HISTORY: Past Medical History:  Diagnosis Date   Allergy    seasonal   Barrett's esophagus    Breast cancer ([place])    Cataract    had surgery   Depression    GERD (gastroesophageal reflux disease)    IBS (irritable bowel syndrome)    Migraines    Osteopenia 2019   T score -1.5 FRAX 8% / 0.7%    PAST SURGICAL HISTORY: Past Surgical History:  Procedure Laterality Date   CATARACT EXTRACTION, BILATERAL  2018   CERVICAL CONE BIOPSY  1996   COLONOSCOPY  07/21/2007   CYSTOSCOPY     MOUTH SURGERY  1978   WISDOM TEETH    FAMILY HISTORY: Family History  Problem Relation Age of Onset   Ovarian cancer Maternal Grandmother    Diverticulitis Mother    Heart disease Father    Kidney cancer Father    Breast cancer Maternal Aunt 50   Stomach cancer Maternal Grandfather    Colon cancer Neg Hx    Esophageal cancer Neg Hx    Rectal cancer Neg Hx  Her parents are living as of 02/2020-- her father at age 55 and her mother at age 31. Carolyn Cooper has one sister (and no brothers). She notes her mother had previously  undergone genetic testing for BRCA 1 and 2, and she was negative. She reports ovarian cancer in her maternal grandmother at age 30, stomach cancer in her maternal grandfather in his 72's, and kidney cancer in her dad at age 62.   GYNECOLOGIC HISTORY:  No LMP recorded. Patient is postmenopausal. Menarche: 64 years old Age at first live birth: 64 years old [place] P 4 LMP 2015? Contraceptive: used "pretty much from 49 on" with no issues HRT used briefly, stopped "about 5 years ago"  Hysterectomy? no BSO? 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 Husband Carolyn Cooper works for [employer]. Daughter Carolyn Cooper, age 35, is a Cabin crew here in [place]. Son Carolyn Cooper, age 60, is unemployed. Daughter Carolyn Cooper, age 20, is a homemaker here in [place]. Son Carolyn Cooper, age 66, works in Chartered loss adjuster in [place]. Carolyn Cooper has 4 grandchildren. She attends a [employer].    ADVANCED DIRECTIVES: In the absence of any documentation to the contrary, the patient's spouse is their HCPOA.    HEALTH MAINTENANCE: Social History   Tobacco Use   Smoking status: Never Smoker   Smokeless tobacco: Never Used  Vaping Use   Vaping Use: Never used  Substance Use Topics   Alcohol use: Not Currently    Alcohol/week: 0.0 standard drinks   Drug use: No     Colonoscopy: 10/2017 (Dr. Silverio Decamp), repeat 2024  PAP: 01/2018, negative  Bone density: 05/2017, -1.5   Allergies  Allergen Reactions   Sulfa Antibiotics Hives    Current Outpatient Medications  Medication Sig Dispense Refill   calcium carbonate (OS-CAL) 600 MG TABS Take 600 mg by mouth 2 (two) times daily with a meal.     escitalopram (LEXAPRO) 20 MG tablet Take 1 tablet (20 mg total) by mouth daily. 90 tablet 3   meloxicam (MOBIC) 15 MG tablet Take 1 tablet (15 mg total) by mouth daily as needed for pain. 90 tablet 1   omeprazole (PRILOSEC)  40 MG capsule TAKE 1 CAPSULE BY MOUTH EVERY DAY 90 capsule 1   Rimegepant Sulfate (NURTEC) 75 MG TBDP Take 1 tablet by mouth every other day. 16 tablet 11   SUMAtriptan (IMITREX) 100 MG tablet TAKE 1 TAB BY MOUTH EVERY 2 HOURS AS NEEDED MAY REPEAT IN 2 HOURS IF HEADACHE PERSISTS 10 tablet 0   Cholecalciferol (VITAMIN D) 400 UNITS capsule Take 800 Units by mouth daily.       hyoscyamine (LEVSIN SL) 0.125 MG SL tablet TAKE I CAPSULE BY MOUTH DAILY AS NEEDED (Patient not taking: Reported on 01/24/2020) 30 tablet 1   naratriptan (AMERGE) 2.5 MG tablet TAKE 1 TABLET AT ONSET OF MIGRAINE, MAY REPEAT AFTER 4 HRS IF NO RELIEF OR HEADACHE RETURNS-MAX 5 IN 24 HRS 10 tablet 1   promethazine (PHENERGAN) 25 MG suppository Place 1 suppository (25 mg total) rectally every 6 (six) hours as needed for nausea or vomiting. 6 suppository 0   rizatriptan (MAXALT) 10 MG tablet TAKE 1 TABLET AS NEEDED FOR MIGRAINE, MAY REPEAT IN TWO HOURS IF NEEDED. MAX OF 2 DOSES PER 24 HOURS 9 tablet 3   Current Facility-Administered Medications  Medication Dose Route Frequency Provider Last Rate Last Admin   0.9 %  sodium chloride infusion  500 mL Intravenous Once Nandigam, Venia Minks, MD        OBJECTIVE: White woman in no acute distress  Vitals:   02/06/20 0906  BP: 123/62  Pulse: 68  Resp: 18  Temp: 98.1 F (36.7 C)  SpO2: 97%     Body mass index is 29.51 kg/m.   Wt Readings from Last 3 Encounters:  02/06/20 180 lb 1.6 oz (81.7 kg)  01/24/20 175 lb (79.4 kg)  10/01/19 174 lb 9.6 oz (79.2 kg)      ECOG FS:1 - Symptomatic but completely ambulatory  Ocular: Sclerae unicteric, pupils round and equal Ear-nose-throat: Wearing a mask Lymphatic: No cervical or supraclavicular adenopathy Lungs no rales or rhonchi Heart regular rate and rhythm Abd soft, nontender, positive bowel sounds MSK no focal spinal tenderness, no joint edema Neuro: non-focal, well-oriented, appropriate affect Breasts: The right breast is  unremarkable.  The left breast is status post recent biopsy.  There is a significant ecchymosis.  Both axillae are benign.   LAB RESULTS:  CMP     Component Value Date/Time   NA 142 02/06/2020 0834   NA 142 05/15/2019 1034   K 3.8 02/06/2020 0834   CL 107 02/06/2020 0834   CO2 27 02/06/2020 0834   GLUCOSE 74 02/06/2020 0834   BUN 13 02/06/2020 0834   BUN 13 05/15/2019 1034   CREATININE 0.88 02/06/2020 0834   CREATININE 0.79 10/11/2012 1527   CALCIUM 9.1 02/06/2020 0834   PROT 7.0 02/06/2020 0834   PROT 6.8 05/15/2019 1034   ALBUMIN 4.0 02/06/2020 0834   ALBUMIN 4.4 05/15/2019 1034   AST 18 02/06/2020 0834   ALT 21 02/06/2020 0834   ALKPHOS 113 02/06/2020 0834   BILITOT 0.5 02/06/2020 0834   GFRNONAA >60 02/06/2020 0834   GFRAA >60 07/09/2019 0308    No results found for: TOTALPROTELP, ALBUMINELP, A1GS, A2GS, BETS, BETA2SER, GAMS, MSPIKE, SPEI  Lab Results  Component Value Date   WBC 5.9 02/06/2020   NEUTROABS 3.8 02/06/2020   HGB 13.0 02/06/2020   HCT 39.1 02/06/2020   MCV 97.0 02/06/2020   PLT 241 02/06/2020    No results found for: LABCA2  No components found for: [id number]  No results for input(s): INR in the last 168 hours.  No results found for: LABCA2  No results found for: HGD924  No results found for: QAS341  No results found for: DQQ229  No results found for: CA2729  No components found for: HGQUANT  No results found for: CEA1 / No results found for: CEA1   No results found for: AFPTUMOR  No results found for: CHROMOGRNA  No results found for: KPAFRELGTCHN, LAMBDASER, KAPLAMBRATIO (kappa/lambda light chains)  No results found for: HGBA, HGBA2QUANT, HGBFQUANT, HGBSQUAN (Hemoglobinopathy evaluation)   No results found for: LDH  No results found for: IRON, TIBC, IRONPCTSAT (Iron and TIBC)  No results found for: FERRITIN  Urinalysis    Component Value Date/Time   COLORURINE YELLOW 12/19/2015 1217   APPEARANCEUR Clear 10/18/2016  0859   LABSPEC 1.001 12/19/2015 1217   PHURINE 6.0 12/19/2015 1217   GLUCOSEU Negative 10/18/2016 0859   HGBUR NEGATIVE 12/19/2015 1217   BILIRUBINUR Negative 10/18/2016 0859   KETONESUR NEGATIVE 12/19/2015 1217   PROTEINUR Negative 10/18/2016 0859   PROTEINUR NEGATIVE 12/19/2015 1217   UROBILINOGEN 0.2 10/12/2013 1545   NITRITE Negative 10/18/2016 0859   NITRITE NEGATIVE 12/19/2015 1217   LEUKOCYTESUR 1+ (A) 10/18/2016 0859     STUDIES: US BREAST LTD UNI LEFT  INC AXILLA  Result Date: 01/15/2020 CLINICAL DATA:  Screening recall for a possible left breast mass. EXAM: DIGITAL DIAGNOSTIC LEFT MAMMOGRAM WITH CAD AND TOMO ULTRASOUND LEFT BREAST COMPARISON:  Previous exam(s). ACR Breast Density Category c: The breast tissue is heterogeneously dense, which may obscure small masses. FINDINGS: The possible mass, noted in the upper outer left breast on the current screening study, persists on the diagnostic spot-compression images. It appears as a small spiculated mass, approximately 4 mm in long axis, between 2 and 3 o'clock. There are no other suspicious masses. Mammographic images were processed with CAD. Targeted ultrasound is performed, showing a small round hypoechoic mass with some posterior acoustic shadowing, as well as ill-defined margins, in the left breast at 2:30 o'clock, 7 cm from the nipple, middle to posterior depth, measuring 3 mm in diameter. This corresponds in size and location to the mammographic mass. Sonographic evaluation of the left axilla shows no enlarged or abnormal lymph nodes. IMPRESSION: 1. Suspicious 3 mm mass in the lateral left breast. Tissue sampling is indicated. RECOMMENDATION: 1. Ultrasound-guided core needle biopsy of the small, 2:30 o'clock position, left breast mass. This procedure was scheduled prior to patient being discharged from the breast Center. I have discussed the findings and recommendations with the patient. If applicable, a reminder letter will be sent  to the patient regarding the next appointment. BI-RADS CATEGORY  4: Suspicious. Electronically Signed   By: Lajean Manes M.D.   On: 01/15/2020 15:14   MM DIAG BREAST TOMO UNI LEFT  Result Date: 01/15/2020 CLINICAL DATA:  Screening recall for a possible left breast mass. EXAM: DIGITAL DIAGNOSTIC LEFT MAMMOGRAM WITH CAD AND TOMO ULTRASOUND LEFT BREAST COMPARISON:  Previous exam(s). ACR Breast Density Category c: The breast tissue is heterogeneously dense, which may obscure small masses. FINDINGS: The possible mass, noted in the upper outer left breast on the current screening study, persists on the diagnostic spot-compression images. It appears as a small spiculated mass, approximately 4 mm in long axis, between 2 and 3 o'clock. There are no other suspicious masses. Mammographic images were processed with CAD. Targeted ultrasound is performed, showing a small round hypoechoic mass with some posterior acoustic shadowing, as well as ill-defined margins, in the left breast at 2:30 o'clock, 7 cm from the nipple, middle to posterior depth, measuring 3 mm in diameter. This corresponds in size and location to the mammographic mass. Sonographic evaluation of the left axilla shows no enlarged or abnormal lymph nodes. IMPRESSION: 1. Suspicious 3 mm mass in the lateral left breast. Tissue sampling is indicated. RECOMMENDATION: 1. Ultrasound-guided core needle biopsy of the small, 2:30 o'clock position, left breast mass. This procedure was scheduled prior to patient being discharged from the breast Center. I have discussed the findings and recommendations with the patient. If applicable, a reminder letter will be sent to the patient regarding the next appointment. BI-RADS CATEGORY  4: Suspicious. Electronically Signed   By: Lajean Manes M.D.   On: 01/15/2020 15:14   MM CLIP PLACEMENT LEFT  Result Date: 01/28/2020 CLINICAL DATA:  64 year old female status post ultrasound-guided biopsy of the left breast. EXAM:  DIAGNOSTIC LEFT MAMMOGRAM POST ULTRASOUND BIOPSY COMPARISON:  Previous exam(s). FINDINGS: Mammographic images were obtained following ultrasound guided biopsy of the left breast. The biopsy marking clip is in expected position at the site of biopsy. IMPRESSION: Appropriate positioning of the ribbon shaped biopsy marking clip at the site of biopsy in the upper-outer left breast. Final Assessment: Post Procedure Mammograms for Marker Placement  Electronically Signed   By: Kristopher Oppenheim M.D.   On: 01/28/2020 08:38   [place] LT BREAST BX W LOC DEV 1ST LESION IMG BX SPEC US GUIDE  Addendum Date: 01/29/2020   ADDENDUM REPORT: 01/29/2020 12:47 ADDENDUM: Pathology revealed GRADE II INVASIVE MAMMARY CARCINOMA, MAMMARY CARCINOMA IN SITU of the LEFT breast, 2:30 o'clock, 7cmfn. This was found to be concordant by Dr. Kristopher Oppenheim. Pathology results were discussed with the patient by telephone. The patient reported doing well after the biopsy with tenderness at the site. Post biopsy instructions and care were reviewed and questions were answered. The patient was encouraged to call [facility] for any additional concerns. The patient was referred to [facility] on February 06, 2020. Additional recommendation for contrast enhanced breast MRI given the patient's heterogeneous breast density. Pathology results reported by Stacie Acres RN on 01/29/2020. Electronically Signed   By: Kristopher Oppenheim M.D.   On: 01/29/2020 12:47   Result Date: 01/29/2020 CLINICAL DATA:  64 year old female with a suspicious left breast mass. EXAM: ULTRASOUND GUIDED LEFT BREAST CORE NEEDLE BIOPSY COMPARISON:  Previous exam(s). PROCEDURE: I met with the patient and we discussed the procedure of ultrasound-guided biopsy, including benefits and alternatives. We discussed the high likelihood of a successful procedure. We discussed the risks of the procedure,  including infection, bleeding, tissue injury, clip migration, and inadequate sampling. Informed written consent was given. The usual time-out protocol was performed immediately prior to the procedure. Lesion quadrant: Upper outer quadrant Using sterile technique and 1% Lidocaine as local anesthetic, under direct ultrasound visualization, a 14 gauge spring-loaded device was used to perform biopsy of a mass at the 2:30 position using a inferior approach. At the conclusion of the procedure ribbon shaped tissue marker clip was deployed into the biopsy cavity. Follow up 2 view mammogram was performed and dictated separately. IMPRESSION: Ultrasound guided biopsy of the left breast. No apparent complications. Electronically Signed: By: Kristopher Oppenheim M.D. On: 01/28/2020 08:38     ELIGIBLE FOR AVAILABLE RESEARCH PROTOCOL: AET  ASSESSMENT: 64 y.o. [place] woman status post left breast upper outer quadrant biopsy 01/28/2020 for a clinical T1a/b N0, stage IA invasive lobular carcinoma, E-cadherin negative, estrogen and progesterone receptor positive, HER-2 not amplified, with an MIB-1 of 10%.  (1) definitive surgery pending  (2) consider Oncotype if the tumor is greater than 1.0 cm  (3) adjuvant radiation  (4) antiestrogens.  PLAN: I met today with Carolyn Cooper to review her new diagnosis. Specifically we discussed the biology of her breast cancer, its diagnosis, staging, treatment  options and prognosis. We first reviewed the fact that cancer is not one disease but more than 100 different diseases and that it is important to keep them separate-- otherwise when friends and relatives discuss their own cancer experiences with Aine confusion can result. Similarly we explained that if breast cancer spreads to the bone or liver, the patient would not have bone cancer or liver cancer, but breast cancer in the bone and breast cancer in the liver: one cancer in three places-- not 3 different cancers which otherwise would  have to be treated in 3 different ways.  We discussed the difference between local and systemic therapy. In terms of loco-regional treatment, lumpectomy plus radiation is equivalent to mastectomy as far as survival is concerned. For this reason, and because the cosmetic results are generally superior, we recommend breast conserving surgery.  She does understand that  lobular breast cancers are very difficult to image and whether or not she will be able to keep her breast ultimately will depend on results of breast MRI which is pending.    We then discussed the rationale for systemic therapy. There is some risk that this cancer may have already spread to other parts of her body. Patients frequently ask at this point about bone scans, CAT scans and PET scans to find out if they have occult breast cancer somewhere else. The problem is that in early stage disease we are much more likely to find false positives then true cancers and this would expose the patient to unnecessary procedures as well as unnecessary radiation. Scans cannot answer the question the patient really would like to know, which is whether she has microscopic disease elsewhere in her body. For those reasons we do not recommend them.  Of course we would proceed to aggressive evaluation of any symptoms that might suggest metastatic disease, but that is not the case here.  Next we went over the options for systemic therapy which are anti-estrogens, anti-HER-2 immunotherapy, and chemotherapy. Keiley does not meet criteria for anti-HER-2 immunotherapy. She is a good candidate for anti-estrogens.  The question of chemotherapy is more complicated. Chemotherapy is most effective in rapidly growing, aggressive tumors. It is much less effective in slow growing cancers, like Berlene 's, and especially so with lobular breast cancers..  In addition we generally do not consider chemotherapy for tumors 5 mm or less.  Accordingly we will not request an Oncotype  in this situation unless the total extent of the tumor exceeds 1.0 cm.  If we do obtain an Oncotype however she understands my expectation is that it will be "low risk".  I do not anticipate her needing chemotherapy or benefiting from chemotherapy  Nalanie has a good understanding of the overall plan. She agrees with it. She knows the goal of treatment in her case is cure. She will call with any problems that may develop before her next visit here.  Total encounter time 65 minutes.Sarajane Jews C. Valynn Schamberger, MD 02/06/2020 11:26 AM Medical Oncology and Hematology [facility] [place], [place] [address] Tel. [phone]    Fax. [phone]   This document serves as a record of services personally performed by Lurline Del, MD. It was created on his behalf by Wilburn Mylar, a trained medical scribe. The creation of this record is based on the scribe's personal observations and the provider's statements to them.   I, Lurline Del MD, have reviewed the above documentation for accuracy and completeness, and I agree with the above.    *Total Encounter Time as defined by the Centers for Medicare and Medicaid Services includes, in addition to the face-to-face time of a patient visit (documented in the note above) non-face-to-face time: obtaining and reviewing outside history, ordering and reviewing medications, tests or procedures, care coordination (communications with other health care professionals or caregivers) and documentation in the medical record.

## 2020-02-06 NOTE — Therapy (Signed)
[place], [place], [address] Phone: [phone]   Fax:  [phone]  Physical Therapy Evaluation  Patient Details  Name: Carolyn Cooper MRN: [id number] Date of Birth: [birth year] Referring Provider (PT): Dr. Erroll Luna   Encounter Date: 02/06/2020   PT End of Session - 02/06/20 1051    Visit Number 1    Number of Visits 2    Date for PT Re-Evaluation 04/02/20    PT Start Time 0950    PT Stop Time 1009   Also saw pt from 1050 to 1100 for a total of 29 minutes   PT Time Calculation (min) 19 min    Activity Tolerance Patient tolerated treatment well    Behavior During Therapy [facility] for tasks assessed/performed           Past Medical History:  Diagnosis Date   Allergy    seasonal   Barrett's esophagus    Breast cancer ([place])    Cataract    had surgery   Depression    GERD (gastroesophageal reflux disease)    IBS (irritable bowel syndrome)    Migraines    Osteopenia 2019   T score -1.5 FRAX 8% / 0.7%    Past Surgical History:  Procedure Laterality Date   CATARACT EXTRACTION, BILATERAL  2018   CERVICAL CONE BIOPSY  1996   COLONOSCOPY  07/21/2007   CYSTOSCOPY     MOUTH SURGERY  1978   WISDOM TEETH    There were no vitals filed for this visit.    Subjective Assessment - 02/06/20 1041    Subjective Patient reports she is here today to be seen by her medical team for her newly diagnosed left breast cancer.    Patient is accompained by: Family member    Pertinent History Patient was diagnosed on 01/02/2020 with left grade II invasive ductal carcinoma breast cancer. It measures 3 mm and is located in the upper outer quadrant. It is ER/PR positive and HER2 negative with a Ki67 of 10%.    Patient Stated Goals Reduce lymphedema risk and learn post op shoulder ROM HEP    Currently in Pain? No/denies              [facility] PT Assessment - 02/06/20 0001      Assessment    Medical Diagnosis Left breast cancer    Referring Provider (PT) Dr. Marcello Moores [place]    Onset Date/Surgical Date 01/02/20    Hand Dominance Left    Prior Therapy none      Precautions   Precautions Other (comment)    Precaution Comments active cancer      Restrictions   Weight Bearing Restrictions No      Balance Screen   Has the patient fallen in the past 6 months Yes    How many times? 1   Fell at [employer] while "horsing around"   Has the patient had a decrease in activity level because of a fear of falling?  No    Is the patient reluctant to leave their home because of a fear of falling?  No      Home Environment   Living Environment Private residence    Living Arrangements Spouse/significant other    Available Help at Discharge Family      Prior Function   Level of [place] Retired    Leisure She does not exercise      Cognition  Overall Cognitive Status Within Functional Limits for tasks assessed      Posture/Postural Control   Posture/Postural Control Postural limitations    Postural Limitations Rounded Shoulders;Forward head      ROM / Strength   AROM / PROM / Strength AROM;Strength      AROM   Overall AROM Comments Cervical AROM is limited 25% except extension is WNL    AROM Assessment Site Shoulder    Right/Left Shoulder Right;Left    Right Shoulder Extension 35 Degrees    Right Shoulder Flexion 147 Degrees    Right Shoulder ABduction 158 Degrees    Right Shoulder Internal Rotation 56 Degrees    Right Shoulder External Rotation 80 Degrees    Left Shoulder Extension 51 Degrees    Left Shoulder Flexion 145 Degrees    Left Shoulder ABduction 157 Degrees    Left Shoulder Internal Rotation 69 Degrees    Left Shoulder External Rotation 90 Degrees      Strength   Overall Strength Within functional limits for tasks performed             LYMPHEDEMA/ONCOLOGY QUESTIONNAIRE - 02/06/20 0001      Type   Cancer Type Left breast  cancer      Lymphedema Assessments   Lymphedema Assessments Upper extremities      Right Upper Extremity Lymphedema   10 cm Proximal to Olecranon Process 32.9 cm    Olecranon Process 26.2 cm    10 cm Proximal to Ulnar Styloid Process 23.3 cm    Just Proximal to Ulnar Styloid Process 16.1 cm    Across Hand at [employer] 18.6 cm    At [place] of 2nd Digit 6.6 cm      Left Upper Extremity Lymphedema   10 cm Proximal to Olecranon Process 34 cm    Olecranon Process 27.1 cm    10 cm Proximal to Ulnar Styloid Process 22.4 cm    Just Proximal to Ulnar Styloid Process 16.3 cm    Across Hand at [employer] 19.4 cm    At [place] of 2nd Digit 6.6 cm           L-DEX FLOWSHEETS - 02/06/20 1000      L-DEX LYMPHEDEMA SCREENING   Measurement Type Unilateral    L-DEX MEASUREMENT EXTREMITY Upper Extremity    POSITION  Standing    DOMINANT SIDE Left    At Risk Side Left    BASELINE SCORE (UNILATERAL) -7.1           The patient was assessed using the L-Dex machine today to produce a lymphedema index baseline score. The patient will be reassessed on a regular basis (typically every 3 months) to obtain new L-Dex scores. If the score is > 6.5 points away from his/her baseline score indicating onset of subclinical lymphedema, it will be recommended to wear a compression garment for 4 weeks, 12 hours per day and then be reassessed. If the score continues to be > 6.5 points from baseline at reassessment, we will initiate lymphedema treatment. Assessing in this manner has a 95% rate of preventing clinically significant lymphedema.      Katina Dung - 02/06/20 0001    Open a tight or new jar No difficulty    Do heavy household chores (wash walls, wash floors) No difficulty    Carry a shopping bag or briefcase No difficulty    Wash your back No difficulty    Use a knife to cut food No  difficulty    Recreational activities in which you take some force or impact through your arm, shoulder, or hand  (golf, hammering, tennis) No difficulty    During the past week, to what extent has your arm, shoulder or hand problem interfered with your normal social activities with family, friends, neighbors, or groups? Not at all    During the past week, to what extent has your arm, shoulder or hand problem limited your work or other regular daily activities Not at all    Arm, shoulder, or hand pain. None    Tingling (pins and needles) in your arm, shoulder, or hand None    Difficulty Sleeping No difficulty    DASH Score 0 %            Objective measurements completed on examination: See above findings.       Patient was instructed today in a home exercise program today for post op shoulder range of motion. These included active assist shoulder flexion in sitting, scapular retraction, wall walking with shoulder abduction, and hands behind head external rotation.  She was encouraged to do these twice a day, holding 3 seconds and repeating 5 times when permitted by her physician.            PT Education - 02/06/20 1049    Education Details Lymphedema risk reduction and post op shoulder ROM HEP    Person(s) Educated Patient;Spouse    Methods Explanation;Demonstration;Handout    Comprehension Returned demonstration;Verbalized understanding               PT Long Term Goals - 02/06/20 1105      PT LONG TERM GOAL #1   Title Patient will demonstrate she has regainde full shoulder ROM and function post operatively compared to baselines.    Time 8    Period Weeks    Status New    Target Date 04/02/20           Breast Clinic Goals - 02/06/20 1103      Patient will be able to verbalize understanding of pertinent lymphedema risk reduction practices relevant to her diagnosis specifically related to skin care.   Time 1    Period Days    Status Achieved      Patient will be able to return demonstrate and/or verbalize understanding of the post-op home exercise program related to  regaining shoulder range of motion.   Time 1    Period Days    Status Achieved      Patient will be able to verbalize understanding of the importance of attending the postoperative After Breast Cancer Class for further lymphedema risk reduction education and therapeutic exercise.   Time 1    Period Days    Status Achieved                 Plan - 02/06/20 1051    Clinical Impression Statement Patient was diagnosed on 01/02/2020 with left grade II invasive ductal carcinoma breast cancer. It measures 3 mm and is located in the upper outer quadrant. It is ER/PR positive and HER2 negative with a Ki67 of 10%. Her multidisciplinary medical team met prior to her assessments to determine a recommended treatment plan. She is planning to have a left lumpectomy and sentinel node biopsy followed by radiation and anti-estrogen therapy. She will benefit from post op PT to reassess and from L-Dex screens every 3 months for 2 years to detect subclinical lymphedema.    Stability/Clinical Decision Making Stable/Uncomplicated  Clinical Decision Making Low    Rehab Potential Excellent    PT Frequency --   Eval and 1 f/u visit   PT Treatment/Interventions ADLs/Self Care Home Management;Therapeutic exercise;Patient/family education    PT Next Visit Plan Will reassess 3-4 weeks post op to determine needs    PT Home Exercise Plan Post op shoulder ROM HEP    Consulted and Agree with Plan of Care Patient;Family member/caregiver    Family Member Consulted Husband           Patient will benefit from skilled therapeutic intervention in order to improve the following deficits and impairments:  Postural dysfunction, Decreased range of motion, Impaired UE functional use, Pain, Decreased knowledge of precautions  Visit Diagnosis: Malignant neoplasm of upper-outer quadrant of left breast in female, estrogen receptor positive ([place]) - Plan: PT plan of care cert/re-cert  Abnormal posture - Plan: PT plan of care  cert/re-cert   Patient will follow up at outpatient cancer rehab 3-4 weeks following surgery.  If the patient requires physical therapy at that time, a specific plan will be dictated and sent to the referring physician for approval. The patient was educated today on appropriate basic range of motion exercises to begin post operatively and the importance of attending the After Breast Cancer class following surgery.  Patient was educated today on lymphedema risk reduction practices as it pertains to recommendations that will benefit the patient immediately following surgery.  She verbalized good understanding.      Problem List Patient Active Problem List   Diagnosis Date Noted   Malignant neoplasm of upper-outer quadrant of left breast in female, estrogen receptor positive ([place]) 01/30/2020   Bursitis of both hips 08/01/2018   Anxiety 10/17/2017   Weight gain 10/17/2017   Microscopic hematuria 09/13/2015   Vitamin D deficiency 09/11/2015   Knee MCL sprain 04/04/2015   Nonallopathic lesion of sacral region 01/10/2015   Nonallopathic lesion of lumbosacral region 01/10/2015   Nonallopathic lesion of thoracic region 01/10/2015   SI (sacroiliac) joint dysfunction 12/19/2014   Osteopenia 01/30/2014   Migraine headache 01/30/2014   Annia Friendly, PT 02/06/20 11:08 AM  [place], [place], [address] Phone: [phone]   Fax:  [phone]  Name: Carolyn Cooper MRN: [id number] Date of Birth: 07-26-1955

## 2020-02-06 NOTE — H&P (Signed)
[name] Appointment: 02/06/2020 9:00 AM Location: [facility] Patient #: [id number] DOB: Aug 22, 1955 Undefined / Language: Cleophus Molt / Race: White Female  History of Present Illness Marcello Moores A. Anner Baity MD; 02/06/2020 11:10 AM) Patient words: Pt seen at the request of Dr Autumn Patty today in the [facility] for evaluation of 5 mm mass left upper outer quadrant breast ILC ER pos PR pos her 2 neu neg . Pt denies history of pain mass or discharge . 2 great aunts with breast cancer.  The patient is a 65 year old female.   Past Surgical History Conni Slipper, RN; 02/06/2020 8:16 AM) Cataract Surgery Bilateral. Oral Surgery  Diagnostic Studies History Conni Slipper, RN; 02/06/2020 8:16 AM) Colonoscopy 1-5 years ago Mammogram 1-3 years ago  Medication History Conni Slipper, RN; 02/06/2020 8:16 AM) Medications Reconciled  Social History Conni Slipper, RN; 02/06/2020 8:16 AM) Alcohol use Occasional alcohol use. Caffeine use Carbonated beverages, Coffee. No drug use Tobacco use Never smoker.  Family History Conni Slipper, RN; 02/06/2020 8:16 AM) Alcohol Abuse Son. Arthritis Mother. Breast Cancer Family Members In General. Cancer Family Members In General, Father. Depression Son. Heart Disease Father. Hypertension Father, Mother. Ischemic Bowel Disease Son. Kidney Disease Father. Migraine Headache Mother. Ovarian Cancer Family Members In General. Thyroid problems Daughter.  Pregnancy / Birth History Conni Slipper, RN; 02/06/2020 8:16 AM) Age at menarche 43 years. Age of menopause 64-55 Gravida 4 Irregular periods Maternal age 28-25 Para 37  Other Problems Conni Slipper, RN; 02/06/2020 8:16 AM) Hemorrhoids Lump In Breast Migraine Headache     Review of Systems Conni Slipper RN; 02/06/2020 8:16 AM) General Not Present- Appetite Loss, Chills, Fatigue, Fever, Night Sweats, Weight Gain and Weight Loss. Skin Not Present- Change in Wart/Mole, Dryness, Hives,  Jaundice, New Lesions, Non-Healing Wounds, Rash and Ulcer. HEENT Not Present- Earache, Hearing Loss, Hoarseness, Nose Bleed, Oral Ulcers, Ringing in the Ears, Seasonal Allergies, Sinus Pain, Sore Throat, Visual Disturbances, Wears glasses/contact lenses and Yellow Eyes. Respiratory Not Present- Bloody sputum, Chronic Cough, Difficulty Breathing, Snoring and Wheezing. Breast Not Present- Breast Mass, Breast Pain, Nipple Discharge and Skin Changes. Cardiovascular Not Present- Chest Pain, Difficulty Breathing Lying Down, Leg Cramps, Palpitations, Rapid Heart Rate, Shortness of Breath and Swelling of Extremities. Gastrointestinal Present- Hemorrhoids. Not Present- Abdominal Pain, Bloating, Bloody Stool, Change in Bowel Habits, Chronic diarrhea, Constipation, Difficulty Swallowing, Excessive gas, Gets full quickly at meals, Indigestion, Nausea, Rectal Pain and Vomiting. Female Genitourinary Not Present- Frequency, Nocturia, Painful Urination, Pelvic Pain and Urgency. Musculoskeletal Not Present- Back Pain, Joint Pain, Joint Stiffness, Muscle Pain, Muscle Weakness and Swelling of Extremities. Neurological Present- Headaches. Not Present- Decreased Memory, Fainting, Numbness, Seizures, Tingling, Tremor, Trouble walking and Weakness. Psychiatric Not Present- Anxiety, Bipolar, Change in Sleep Pattern, Depression, Fearful and Frequent crying. Endocrine Not Present- Cold Intolerance, Excessive Hunger, Hair Changes, Heat Intolerance, Hot flashes and New Diabetes. Hematology Not Present- Blood Thinners, Easy Bruising, Excessive bleeding, Gland problems, HIV and Persistent Infections.   Physical Exam (Nicky Milhouse A. Meshia Rau MD; 02/06/2020 11:04 AM)  General Mental Status-Alert. General Appearance-Consistent with stated age. Hydration-Well hydrated. Voice-Normal.  Head and Neck Head-normocephalic, atraumatic with no lesions or palpable masses. Trachea-midline. Thyroid Gland Characteristics - normal  size and consistency.  Eye Eyeball - Bilateral-Extraocular movements intact. Sclera/Conjunctiva - Bilateral-No scleral icterus.  Chest and Lung Exam Chest and lung exam reveals -quiet, even and easy respiratory effort with no use of accessory muscles and on auscultation, normal breath sounds, no adventitious sounds and normal vocal resonance. Inspection Chest Wall -  Normal. Back - normal.  Breast Breast - Left-Symmetric, Non Tender, No Biopsy scars, no Dimpling - Left, No Inflammation, No Lumpectomy scars, No Mastectomy scars, No Peau d' Orange. Breast - Right-Symmetric, Non Tender, No Biopsy scars, no Dimpling - Right, No Inflammation, No Lumpectomy scars, No Mastectomy scars, No Peau d' Orange. Breast Lump-No Palpable Breast Mass.  Cardiovascular Cardiovascular examination reveals -normal heart sounds, regular rate and rhythm with no murmurs and normal pedal pulses bilaterally.  Abdomen Inspection Inspection of the abdomen reveals - No Hernias. Skin - Scar - no surgical scars. Palpation/Percussion Palpation and Percussion of the abdomen reveal - Soft, Non Tender, No Rebound tenderness, No Rigidity (guarding) and No hepatosplenomegaly. Auscultation Auscultation of the abdomen reveals - Bowel sounds normal.  Neurologic Neurologic evaluation reveals -alert and oriented x 3 with no impairment of recent or remote memory. Mental Status-Normal.  Musculoskeletal Normal Exam - Left-Upper Extremity Strength Normal and Lower Extremity Strength Normal. Normal Exam - Right-Upper Extremity Strength Normal and Lower Extremity Strength Normal.  Lymphatic Head & Neck  General Head & Neck Lymphatics: Bilateral - Description - Normal. Axillary  General Axillary Region: Bilateral - Description - Normal. Tenderness - Non Tender. Femoral & Inguinal  Generalized Femoral & Inguinal Lymphatics: Bilateral - Description - Normal. Tenderness - Non Tender.    Assessment &  Plan (Joyceann Kruser A. Kioni Stahl MD; 02/06/2020 11:06 AM)  LOBULAR BREAST CANCER, LEFT (C50.912) Impression: stage 1 MRI left breast seed lumpectomy with SLN mapping Risk of lumpectomy include bleeding, infection, seroma, more surgery, use of seed/wire, wound care, cosmetic deformity and the need for other treatments, death , blood clots, death. Pt agrees to proceed. Risk of sentinel lymph node mapping include bleeding, infection, lymphedema, shoulder pain. stiffness, dye allergy. cosmetic deformity , blood clots, death, need for more surgery. Pt agrees to proceed.  total time 45 minutes  Current Plans You are being scheduled for surgery- Our schedulers will call you.  You should hear from our office's scheduling department within 5 working days about the location, date, and time of surgery. We try to make accommodations for patient's preferences in scheduling surgery, but sometimes the OR schedule or the surgeon's schedule prevents [place] from making those accommodations.  If you have not heard from our office [phone]) in 5 working days, call the office and ask for your surgeon's nurse.  If you have other questions about your diagnosis, plan, or surgery, call the office and ask for your surgeon's nurse.  Pt Education - [facility] Breast Cancer Information Given - [facility] "Breast Journey" Package We discussed the staging and pathophysiology of breast cancer. We discussed all of the different options for treatment for breast cancer including surgery, chemotherapy, radiation therapy, Herceptin, and antiestrogen therapy. We discussed a sentinel lymph node biopsy as she does not appear to having lymph node involvement right now. We discussed the performance of that with injection of radioactive tracer and blue dye. We discussed that she would have an incision underneath her axillary hairline. We discussed that there is a bout a 10-20% chance of having a positive node with a sentinel lymph node biopsy and we  will await the permanent pathology to make any other first further decisions in terms of her treatment. One of these options might be to return to the operating room to perform an axillary lymph node dissection. We discussed about a 1-2% risk lifetime of chronic shoulder pain as well as lymphedema associated with a sentinel lymph node biopsy. We discussed the options for treatment  of the breast cancer which included lumpectomy versus a mastectomy. We discussed the performance of the lumpectomy with a wire placement. We discussed a 10-20% chance of a positive margin requiring reexcision in the operating room. We also discussed that she may need radiation therapy or antiestrogen therapy or both if she undergoes lumpectomy. We discussed the mastectomy and the postoperative care for that as well. We discussed that there is no difference in her survival whether she undergoes lumpectomy with radiation therapy or antiestrogen therapy versus a mastectomy. There is a slight difference in the local recurrence rate being 3-5% with lumpectomy and about 1% with a mastectomy. We discussed the risks of operation including bleeding, infection, possible reoperation. She understands her further therapy will be based on what her stages at the time of her operation.  Pt Education - [facility] breast cancer surgery: discussed with patient and provided information.

## 2020-02-06 NOTE — Progress Notes (Signed)
REFERRING PROVIDER: Chauncey Cruel, MD [address] [place],  [place] [address]  PRIMARY PROVIDER:  Janora Norlander, DO  PRIMARY REASON FOR VISIT:  1. Malignant neoplasm of upper-outer quadrant of left breast in female, estrogen receptor positive ([place])   2. Family history of ovarian cancer   3. Family history of breast cancer   4. Family history of stomach cancer   5. Family history of kidney cancer      I connected with Ms. Pastorino on 02/06/2020 at 11:15 am EDT by video conference and verified that I am speaking with the correct person using two identifiers.   Patient location: [facility] clinic Provider location: [facility] office  HISTORY OF PRESENT ILLNESS:   Ms. Treloar, a 64 y.o. female, was seen for a [place] cancer genetics consultation at the request of Dr. Jana Hakim due to a personal and family history of cancer.  Ms. Eiben presents to clinic today to discuss the possibility of a hereditary predisposition to cancer, genetic testing, and to further clarify her future cancer risks, as well as potential cancer risks for family members.   In 2021, at the age of 59, Ms. Juma was diagnosed with invasive lobular carcinoma, ER+/PR+/Her2-, of the left breast. The treatment plan includes surgery, consideration of oncotype depending on tumor size, adjuvant radiation therapy, and antiestrogen therapy.   RISK FACTORS:  Menarche was at age 74.  First live birth at age 21.  OCP use: yes Ovaries intact: yes.  Hysterectomy: no.  Menopausal status: postmenopausal.  HRT use: briefly, stopped about 5 years ago. Colonoscopy: yes; 2019. Mammogram within the last year: yes.   Past Medical History:  Diagnosis Date  . Allergy    seasonal  . Barrett's esophagus   . Breast cancer ([place])   . Cataract    had surgery  . Depression   . Family history of breast cancer   . Family history of kidney cancer   . Family history of ovarian cancer   . Family history of stomach cancer   . GERD  (gastroesophageal reflux disease)   . IBS (irritable bowel syndrome)   . Migraines   . Osteopenia 2019   T score -1.5 FRAX 8% / 0.7%    Past Surgical History:  Procedure Laterality Date  . CATARACT EXTRACTION, BILATERAL  2018  . CERVICAL CONE BIOPSY  1996  . COLONOSCOPY  07/21/2007  . CYSTOSCOPY    . MOUTH SURGERY  1978   WISDOM TEETH    Social History   Socioeconomic History  . Marital status: Married    Spouse name: Not on file  . Number of children: Not on file  . Years of education: Not on file  . Highest education level: Not on file  Occupational History  . Not on file  Tobacco Use  . Smoking status: Never Smoker  . Smokeless tobacco: Never Used  Vaping Use  . Vaping Use: Never used  Substance and Sexual Activity  . Alcohol use: Not Currently    Alcohol/week: 0.0 standard drinks  . Drug use: No  . Sexual activity: Yes    Birth control/protection: Post-menopausal    Comment: 1st intercourse 64 yo-Fewer than 5 partners  Other Topics Concern  . Not on file  Social History Narrative  . Not on file   Social Determinants of Health   Financial Resource Strain: Low Risk   . Difficulty of Paying Living Expenses: Not hard at all  Food Insecurity: No Food Insecurity  . Worried  About Running Out of Food in the Last Year: Never true  . Ran Out of Food in the Last Year: Never true  Transportation Needs: No Transportation Needs  . Lack of Transportation (Medical): No  . Lack of Transportation (Non-Medical): No  Physical Activity:   . Days of Exercise per Week: Not on file  . Minutes of Exercise per Session: Not on file  Stress:   . Feeling of Stress : Not on file  Social Connections:   . Frequency of Communication with Friends and Family: Not on file  . Frequency of Social Gatherings with Friends and Family: Not on file  . Attends Religious Services: Not on file  . Active Member of Clubs or Organizations: Not on file  . Attends Archivist Meetings: Not  on file  . Marital Status: Not on file     FAMILY HISTORY:  We obtained a detailed, 4-generation family history.  Significant diagnoses are listed below: Family History  Problem Relation Age of Onset  . Ovarian cancer Maternal Grandmother 60  . Diverticulitis Mother   . Heart disease Father   . Kidney cancer Father 53  . Stomach cancer Maternal Grandfather        dx 19s  . Breast cancer Other        dx >50, maternal great-aunt  . Breast cancer Other        dx >50, maternal great-aunt  . Breast cancer Other        dx >50, maternal great-aunt  . Colon cancer Neg Hx   . Esophageal cancer Neg Hx   . Rectal cancer Neg Hx    Ms. Fantroy has two daughters and two sons (ages 66-39). She has one sister (age 32). None of these family members have had cancer.  Ms. Ficco mother is 5 and has not had cancer. She had genetic testing many years ago of the BRCA1 and BRCA2 genes, which was negative. Ms. Weisenburger has one maternal aunt and one maternal uncle. Her maternal grandmother died in her 76s from ovarian cancer. Her maternal grandmother had three sisters who were diagnosed with breast cancer older than 69. Ms. Thelen maternal grandfather died from stomach cancer diagnosed in his 17s.   Ms. Barno father is 35 and was diagnosed with kidney cancer last year at the age of 1. Her father did not have any siblings. Her paternal grandmother died at the age of 24 and was not diagnosed with cancer, although Ms. Michelle notes that her grandmother had an abscess on her breast while living in a nursing home which was not evaluated. Her paternal grandfather died at the age of 58 from heart problems.   Ms. Dinino is aware of previous family history of genetic testing for hereditary cancer risks in her mother. Patient's maternal and paternal ancestors are of English descent. There is no reported Ashkenazi Jewish ancestry. There is no known consanguinity.  GENETIC COUNSELING ASSESSMENT: Ms. Spraker is a 64 y.o.  female with a personal history of breast cancer and a family history of ovarian cancer, breast cancer, stomach cancer, and kidney cancer, which is somewhat suggestive of a hereditary cancer syndrome and predisposition to cancer. We, therefore, discussed and recommended the following at today's visit.   DISCUSSION: We discussed that approximately 5-10% of breast cancer is hereditary, with most cases associated with the BRCA1 and BRCA2 genes. There are other genes that can be associated with hereditary breast cancer syndromes. These include ATM, CHEK2, PALB2, etc. We discussed that  testing is beneficial for several reasons, including knowing about other cancer risks, identifying potential screening and risk-reduction options that may be appropriate, and to understand if other family members could be at risk for cancer and allow them to undergo genetic testing.  We reviewed the characteristics, features and inheritance patterns of hereditary cancer syndromes. We also discussed genetic testing, including the appropriate family members to test, the process of testing, insurance coverage and turn-around-time for results. We discussed the implications of a negative, positive and/or variant of uncertain significant result. In order to get genetic test results in a timely manner so that Ms. Bellville can use these genetic test results for surgical decisions, we recommended Ms. Bunnell pursue genetic testing for the [facility] Breast Cancer STAT panel. Once complete, we recommend Ms. Golden pursue reflex genetic testing to the Common Hereditary Cancers panel.   The Breast Cancer STAT Panel offered by [facility] includes sequencing and deletion/duplication analysis for the following 9 genes:  ATM, BRCA1, BRCA2, CDH1, CHEK2, PALB2, PTEN, STK11 and TP53. The Common Hereditary Cancers Panel offered by [facility] includes sequencing and/or deletion duplication testing of the following 48 genes: APC, ATM, AXIN2, BARD1, BMPR1A, BRCA1, BRCA2,  BRIP1, CDH1, CDK4, CDKN2A (p14ARF), CDKN2A (p16INK4a), CHEK2, CTNNA1, DICER1, EPCAM (Deletion/duplication testing only), GREM1 (promoter region deletion/duplication testing only), KIT, MEN1, MLH1, MSH2, MSH3, MSH6, MUTYH, NBN, NF1, NTHL1, PALB2, PDGFRA, PMS2, POLD1, POLE, PTEN, RAD50, RAD51C, RAD51D, RNF43, SDHB, SDHC, SDHD, SMAD4, SMARCA4. STK11, TP53, TSC1, TSC2, and VHL.  The following genes are evaluated for sequence changes only: SDHA and HOXB13 c.251G>A variant only.   Based on Ms. Rody's personal and family history of cancer, she meets medical criteria for genetic testing. Despite that she meets criteria, she may still have an out of pocket cost.   PLAN: After considering the risks, benefits, and limitations, Ms. Beel provided informed consent to pursue genetic testing and the blood sample was sent to [facility] for analysis of the Breast Cancer STAT panel + Common Hereditary Cancers panel. Results should be available within approximately one-two weeks' time, at which point they will be disclosed by telephone to Ms. Swartout, as will any additional recommendations warranted by these results. Ms. Cannady will receive a summary of her genetic counseling visit and a copy of her results once available. This information will also be available in Epic.   Ms. Hougland questions were answered to her satisfaction today. Our contact information was provided should additional questions or concerns arise. Thank you for the referral and allowing [place] to share in the care of your patient.   Clint Guy, [place], [facility] Licensed, Certified Dispensing [email or web address] Phone: [phone]  The patient was seen for a total of 20 minutes in face-to-face genetic counseling.  This patient was discussed with Drs. Magrinat, Lindi Adie and/or Burr Medico who agrees with the above.    _______________________________________________________________________ For Office Staff:  Number of people involved in  session: 1 Was an Intern/ student involved with case: no

## 2020-02-06 NOTE — Patient Instructions (Signed)

## 2020-02-06 NOTE — H&P (View-Only) (Signed)
Carolyn Cooper Appointment: 02/06/2020 9:00 AM Location: [facility] Patient #: [id number] DOB: Sep 27, 1955 Undefined / Language: Carolyn Cooper / Race: White Female  History of Present Illness Carolyn Moores A. Lunna Vogelgesang MD; 02/06/2020 11:10 AM) Patient words: Pt seen at the request of Dr Autumn Patty today in the [facility] for evaluation of 5 mm mass left upper outer quadrant breast ILC ER pos PR pos her 2 neu neg . Pt denies history of pain mass or discharge . 2 great aunts with breast cancer.  The patient is a 64 year old female.   Past Surgical History Carolyn Slipper, RN; 02/06/2020 8:16 AM) Cataract Surgery Bilateral. Oral Surgery  Diagnostic Studies History Carolyn Slipper, RN; 02/06/2020 8:16 AM) Colonoscopy 1-5 years ago Mammogram 1-3 years ago  Medication History Carolyn Slipper, RN; 02/06/2020 8:16 AM) Medications Reconciled  Social History Carolyn Slipper, RN; 02/06/2020 8:16 AM) Alcohol use Occasional alcohol use. Caffeine use Carbonated beverages, Coffee. No drug use Tobacco use Never smoker.  Family History Carolyn Slipper, RN; 02/06/2020 8:16 AM) Alcohol Abuse Son. Arthritis Mother. Breast Cancer Family Members In General. Cancer Family Members In General, Father. Depression Son. Heart Disease Father. Hypertension Father, Mother. Ischemic Bowel Disease Son. Kidney Disease Father. Migraine Headache Mother. Ovarian Cancer Family Members In General. Thyroid problems Daughter.  Pregnancy / Birth History Carolyn Slipper, RN; 02/06/2020 8:16 AM) Age at menarche 41 years. Age of menopause 64-55 Gravida 4 Irregular periods Maternal age 42-25 Para 72  Other Problems Carolyn Slipper, RN; 02/06/2020 8:16 AM) Hemorrhoids Lump In Breast Migraine Headache     Review of Systems Carolyn Slipper RN; 02/06/2020 8:16 AM) General Not Present- Appetite Loss, Chills, Fatigue, Fever, Night Sweats, Weight Gain and Weight Loss. Skin Not Present- Change in Wart/Mole, Dryness, Hives,  Jaundice, New Lesions, Non-Healing Wounds, Rash and Ulcer. HEENT Not Present- Earache, Hearing Loss, Hoarseness, Nose Bleed, Oral Ulcers, Ringing in the Ears, Seasonal Allergies, Sinus Pain, Sore Throat, Visual Disturbances, Wears glasses/contact lenses and Yellow Eyes. Respiratory Not Present- Bloody sputum, Chronic Cough, Difficulty Breathing, Snoring and Wheezing. Breast Not Present- Breast Mass, Breast Pain, Nipple Discharge and Skin Changes. Cardiovascular Not Present- Chest Pain, Difficulty Breathing Lying Down, Leg Cramps, Palpitations, Rapid Heart Rate, Shortness of Breath and Swelling of Extremities. Gastrointestinal Present- Hemorrhoids. Not Present- Abdominal Pain, Bloating, Bloody Stool, Change in Bowel Habits, Chronic diarrhea, Constipation, Difficulty Swallowing, Excessive gas, Gets full quickly at meals, Indigestion, Nausea, Rectal Pain and Vomiting. Female Genitourinary Not Present- Frequency, Nocturia, Painful Urination, Pelvic Pain and Urgency. Musculoskeletal Not Present- Back Pain, Joint Pain, Joint Stiffness, Muscle Pain, Muscle Weakness and Swelling of Extremities. Neurological Present- Headaches. Not Present- Decreased Memory, Fainting, Numbness, Seizures, Tingling, Tremor, Trouble walking and Weakness. Psychiatric Not Present- Anxiety, Bipolar, Change in Sleep Pattern, Depression, Fearful and Frequent crying. Endocrine Not Present- Cold Intolerance, Excessive Hunger, Hair Changes, Heat Intolerance, Hot flashes and New Diabetes. Hematology Not Present- Blood Thinners, Easy Bruising, Excessive bleeding, Gland problems, HIV and Persistent Infections.   Physical Exam (Carolyn Halbert A. Estephany Perot MD; 02/06/2020 11:04 AM)  General Mental Status-Alert. General Appearance-Consistent with stated age. Hydration-Well hydrated. Voice-Normal.  Head and Neck Head-normocephalic, atraumatic with no lesions or palpable masses. Trachea-midline. Thyroid Gland Characteristics - normal  size and consistency.  Eye Eyeball - Bilateral-Extraocular movements intact. Sclera/Conjunctiva - Bilateral-No scleral icterus.  Chest and Lung Exam Chest and lung exam reveals -quiet, even and easy respiratory effort with no use of accessory muscles and on auscultation, normal breath sounds, no adventitious sounds and normal vocal resonance. Inspection Chest Wall -  Normal. Back - normal.  Breast Breast - Left-Symmetric, Non Tender, No Biopsy scars, no Dimpling - Left, No Inflammation, No Lumpectomy scars, No Mastectomy scars, No Peau d' Orange. Breast - Right-Symmetric, Non Tender, No Biopsy scars, no Dimpling - Right, No Inflammation, No Lumpectomy scars, No Mastectomy scars, No Peau d' Orange. Breast Lump-No Palpable Breast Mass.  Cardiovascular Cardiovascular examination reveals -normal heart sounds, regular rate and rhythm with no murmurs and normal pedal pulses bilaterally.  Abdomen Inspection Inspection of the abdomen reveals - No Hernias. Skin - Scar - no surgical scars. Palpation/Percussion Palpation and Percussion of the abdomen reveal - Soft, Non Tender, No Rebound tenderness, No Rigidity (guarding) and No hepatosplenomegaly. Auscultation Auscultation of the abdomen reveals - Bowel sounds normal.  Neurologic Neurologic evaluation reveals -alert and oriented x 3 with no impairment of recent or remote memory. Mental Status-Normal.  Musculoskeletal Normal Exam - Left-Upper Extremity Strength Normal and Lower Extremity Strength Normal. Normal Exam - Right-Upper Extremity Strength Normal and Lower Extremity Strength Normal.  Lymphatic Head & Neck  General Head & Neck Lymphatics: Bilateral - Description - Normal. Axillary  General Axillary Region: Bilateral - Description - Normal. Tenderness - Non Tender. Femoral & Inguinal  Generalized Femoral & Inguinal Lymphatics: Bilateral - Description - Normal. Tenderness - Non Tender.    Assessment &  Plan (Carolyn Boxley A. Jes Costales MD; 02/06/2020 11:06 AM)  LOBULAR BREAST CANCER, LEFT (C50.912) Impression: stage 1 MRI left breast seed lumpectomy with SLN mapping Risk of lumpectomy include bleeding, infection, seroma, more surgery, use of seed/wire, wound care, cosmetic deformity and the need for other treatments, death , blood clots, death. Pt agrees to proceed. Risk of sentinel lymph node mapping include bleeding, infection, lymphedema, shoulder pain. stiffness, dye allergy. cosmetic deformity , blood clots, death, need for more surgery. Pt agrees to proceed.  total time 45 minutes  Current Plans You are being scheduled for surgery- Our schedulers will call you.  You should hear from our office's scheduling department within 5 working days about the location, date, and time of surgery. We try to make accommodations for patient's preferences in scheduling surgery, but sometimes the OR schedule or the surgeon's schedule prevents [place] from making those accommodations.  If you have not heard from our office [phone]) in 5 working days, call the office and ask for your surgeon's nurse.  If you have other questions about your diagnosis, plan, or surgery, call the office and ask for your surgeon's nurse.  Pt Education - [facility] Breast Cancer Information Given - [facility] "Breast Journey" Package We discussed the staging and pathophysiology of breast cancer. We discussed all of the different options for treatment for breast cancer including surgery, chemotherapy, radiation therapy, Herceptin, and antiestrogen therapy. We discussed a sentinel lymph node biopsy as she does not appear to having lymph node involvement right now. We discussed the performance of that with injection of radioactive tracer and blue dye. We discussed that she would have an incision underneath her axillary hairline. We discussed that there is a bout a 10-20% chance of having a positive node with a sentinel lymph node biopsy and we  will await the permanent pathology to make any other first further decisions in terms of her treatment. One of these options might be to return to the operating room to perform an axillary lymph node dissection. We discussed about a 1-2% risk lifetime of chronic shoulder pain as well as lymphedema associated with a sentinel lymph node biopsy. We discussed the options for treatment  of the breast cancer which included lumpectomy versus a mastectomy. We discussed the performance of the lumpectomy with a wire placement. We discussed a 10-20% chance of a positive margin requiring reexcision in the operating room. We also discussed that she may need radiation therapy or antiestrogen therapy or both if she undergoes lumpectomy. We discussed the mastectomy and the postoperative care for that as well. We discussed that there is no difference in her survival whether she undergoes lumpectomy with radiation therapy or antiestrogen therapy versus a mastectomy. There is a slight difference in the local recurrence rate being 3-5% with lumpectomy and about 1% with a mastectomy. We discussed the risks of operation including bleeding, infection, possible reoperation. She understands her further therapy will be based on what her stages at the time of her operation.  Pt Education - [facility] breast cancer surgery: discussed with patient and provided information.

## 2020-02-06 NOTE — Progress Notes (Signed)
Carolyn Cooper  Initial Assessment   Carolyn Cooper is a 64 y.o. year old female accompanied by patient and husband, Carolyn Cooper. Clinical Social Cooper was referred by [facility] for assessment of psychosocial needs.   SDOH (Social Determinants of Health) assessments performed: Yes SDOH Interventions     Most Recent Value  SDOH Interventions  Food Insecurity Interventions Intervention Not Indicated  Financial Strain Interventions Intervention Not Indicated  Housing Interventions Intervention Not Indicated  Transportation Interventions Intervention Not Indicated      Distress Screen completed: Yes   ONCBCN DISTRESS SCREENING 02/06/2020  Screening Type Initial Screening  Distress experienced in past week (1-10) 2  Family Problem type Children    Family/Social Information:  . Housing Arrangement: patient lives with husband, dogs . Family members/support persons in your life? Family (parents, four children, husband), Friends and Church . Transportation concerns: no  . Employment: Retired. Income source: Retirement savings/income . Financial concerns: No o Type of concern: None . Food access concerns: no . Religious or spiritual practice: yes, involved in church community and bible study group . Medication Concerns: no  . Services Currently in place:  n/a  Coping/ Adjustment to diagnosis: . Patient understands treatment plan and what happens next? yes, feels a little better after hearing the plan . Concerns about diagnosis and/or treatment: I'm not especially worried about anything . Patient enjoys time with family/ friends and cooking, decorating, volunteering . Current coping skills/ strengths: Capable of independent living, Scientist, research (life sciences), Motivation for treatment/growth, Religious Affiliation, Special hobby/interest and Supportive family/friends    SUMMARY: Current SDOH Barriers:  . None noted today  Clinical Social Cooper Clinical Goal(s):  Marland Kitchen Patient will continue to  attend medical appts as recommended  Interventions: . Discussed common feeling and emotions when being diagnosed with cancer, and the importance of support during treatment . Informed patient of the support team roles and support services at [facility] . Provided CSW contact information and encouraged patient to call with any questions or concerns . Patient interviewed and appropriate assessments performed   Follow Up Plan: Patient will contact this CSW for any support or resource needs Patient verbalizes understanding of plan: Yes    Christeen Douglas LCSW

## 2020-02-06 NOTE — Progress Notes (Signed)
Radiation Oncology         [phone] ________________________________  Name: Carolyn Cooper        MRN: [id number]  Date of Service: 02/06/2020 DOB: 04/21/1955  KZ:SWFUXNATFT, Koleen Distance, DO  Cornett, Marcello Moores, MD     REFERRING PHYSICIAN: Erroll Luna, MD   DIAGNOSIS: The encounter diagnosis was Malignant neoplasm of upper-outer quadrant of left breast in female, estrogen receptor positive ([place]).   HISTORY OF PRESENT ILLNESS: Carolyn Cooper is a 64 y.o. female seen in the multidisciplinary breast clinic for a new diagnosis of left breast cancer. The patient was noted to have a screening detected mass in the left breast. She underwent diagnostic imaging which revealed a mass in the posterior upper outer quadrant at 2:30 position measuring 3 mm in maximum dimension. Her axilla was negative for adenopathy. She underwent a biopsy of this lesion on 01/28/20 that revealed an invasive lobular carcinoma grade 2, with associated LCIS. Her tumor was ER/PR positive, HER 2 negative with a Ki 67 of 10%. She is seen today to discuss treatment of her cancer.    PREVIOUS RADIATION THERAPY: No   PAST MEDICAL HISTORY:  Past Medical History:  Diagnosis Date  . Allergy    seasonal  . Barrett's esophagus   . Cataract    had surgery  . Depression   . GERD (gastroesophageal reflux disease)   . IBS (irritable bowel syndrome)   . Migraines   . Osteopenia 2019   T score -1.5 FRAX 8% / 0.7%       PAST SURGICAL HISTORY: Past Surgical History:  Procedure Laterality Date  . CATARACT EXTRACTION, BILATERAL  2018  . CERVICAL CONE BIOPSY  1996  . COLONOSCOPY  07/21/2007  . CYSTOSCOPY    . MOUTH SURGERY  1978   WISDOM TEETH     FAMILY HISTORY:  Family History  Problem Relation Age of Onset  . Ovarian cancer Maternal Grandmother   . Diverticulitis Mother   . Heart disease Father   . Kidney cancer Father   . Breast cancer Maternal Aunt 40  . Stomach cancer Maternal Grandfather   .  Colon cancer Neg Hx   . Esophageal cancer Neg Hx   . Rectal cancer Neg Hx      SOCIAL HISTORY:  reports that she has never smoked. She has never used smokeless tobacco. She reports previous alcohol use. She reports that she does not use drugs. The patient is married and lives in [place]. 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 She has adult children and young grandchildren.   ALLERGIES: Sulfa antibiotics   MEDICATIONS:  Current Outpatient Medications  Medication Sig Dispense Refill  . calcium carbonate (OS-CAL) 600 MG TABS Take 600 mg by mouth 2 (two) times daily with a meal.    . Cholecalciferol (VITAMIN D) 400 UNITS capsule Take 800 Units by mouth daily.      Marland Kitchen escitalopram (LEXAPRO) 20 MG tablet Take 1 tablet (20 mg total) by mouth daily. 90 tablet 3  . hyoscyamine (LEVSIN SL) 0.125 MG SL tablet TAKE I CAPSULE BY MOUTH DAILY AS NEEDED (Patient not taking: Reported on 01/24/2020) 30 tablet 1  . meloxicam (MOBIC) 15 MG tablet Take 1 tablet (15 mg total) by mouth daily as needed for pain. 90 tablet 1  . naratriptan (AMERGE) 2.5 MG tablet TAKE 1 TABLET AT ONSET OF MIGRAINE, MAY REPEAT AFTER 4 HRS IF NO RELIEF OR HEADACHE RETURNS-MAX 5 IN  24 HRS 10 tablet 1  . omeprazole (PRILOSEC) 40 MG capsule TAKE 1 CAPSULE BY MOUTH EVERY DAY 90 capsule 1  . promethazine (PHENERGAN) 25 MG suppository Place 1 suppository (25 mg total) rectally every 6 (six) hours as needed for nausea or vomiting. 6 suppository 0  . Rimegepant Sulfate (NURTEC) 75 MG TBDP Take 1 tablet by mouth every other day. 16 tablet 11  . rizatriptan (MAXALT) 10 MG tablet TAKE 1 TABLET AS NEEDED FOR MIGRAINE, MAY REPEAT IN TWO HOURS IF NEEDED. MAX OF 2 DOSES PER 24 HOURS 9 tablet 3  . SUMAtriptan (IMITREX) 100 MG tablet TAKE 1 TAB BY MOUTH EVERY 2 HOURS AS NEEDED MAY REPEAT IN 2 HOURS IF HEADACHE PERSISTS 10 tablet 0   Current Facility-Administered Medications  Medication Dose Route Frequency  Provider Last Rate Last Admin  . 0.9 %  sodium chloride infusion  500 mL Intravenous Once Nandigam, Venia Minks, MD         REVIEW OF SYSTEMS: On review of systems, the patient reports that she is doing well overall. She is very well informed already about her cancer. She's interested in discussing genetic testing options as her mother had testing that was negative for BRCA but given family history had concerns about her need for testing. No other complaints are noted.     PHYSICAL EXAM:  Wt Readings from Last 3 Encounters:  01/24/20 175 lb (79.4 kg)  10/01/19 174 lb 9.6 oz (79.2 kg)  07/09/19 169 lb (76.7 kg)   Temp Readings from Last 3 Encounters:  10/01/19 (!) 97.1 F (36.2 C)  07/09/19 98 F (36.7 C)  06/27/19 97.7 F (36.5 C) (Oral)   BP Readings from Last 3 Encounters:  01/24/20 122/80  10/01/19 127/66  07/09/19 113/87   Pulse Readings from Last 3 Encounters:  10/01/19 76  07/09/19 91  06/27/19 72    In general this is a well appearing caucasian female in no acute distress. She's alert and oriented x4 and appropriate throughout the examination. Cardiopulmonary assessment is negative for acute distress and she exhibits normal effort. Bilateral breast exam is deferred.    ECOG = 0  0 - Asymptomatic (Fully active, able to carry on all predisease activities without restriction)  1 - Symptomatic but completely ambulatory (Restricted in physically strenuous activity but ambulatory and able to carry out work of a light or sedentary nature. For example, light housework, office work)  2 - Symptomatic, <50% in bed during the day (Ambulatory and capable of all self care but unable to carry out any work activities. Up and about more than 50% of waking hours)  3 - Symptomatic, >50% in bed, but not bedbound (Capable of only limited self-care, confined to bed or chair 50% or more of waking hours)  4 - Bedbound (Completely disabled. Cannot carry on any self-care. Totally confined  to bed or chair)  5 - Death   Eustace Pen MM, Creech RH, Tormey DC, et al. [phone]). "Toxicity and response criteria of the [facility]". [place] [facility]. 5 (6): 649-55    LABORATORY DATA:  Lab Results  Component Value Date   WBC 11.7 (H) 07/09/2019   HGB 14.3 07/09/2019   HCT 42.3 07/09/2019   MCV 95.5 07/09/2019   PLT 237 07/09/2019   Lab Results  Component Value Date   NA 138 07/09/2019   K 3.7 07/09/2019   CL 105 07/09/2019   CO2 23 07/09/2019   Lab Results  Component Value  Date   ALT 27 07/09/2019   AST 29 07/09/2019   ALKPHOS 91 07/09/2019   BILITOT 1.1 07/09/2019      RADIOGRAPHY: US BREAST LTD UNI LEFT INC AXILLA  Result Date: 01/15/2020 CLINICAL DATA:  Screening recall for a possible left breast mass. EXAM: DIGITAL DIAGNOSTIC LEFT MAMMOGRAM WITH CAD AND TOMO ULTRASOUND LEFT BREAST COMPARISON:  Previous exam(s). ACR Breast Density Category c: The breast tissue is heterogeneously dense, which may obscure small masses. FINDINGS: The possible mass, noted in the upper outer left breast on the current screening study, persists on the diagnostic spot-compression images. It appears as a small spiculated mass, approximately 4 mm in long axis, between 2 and 3 o'clock. There are no other suspicious masses. Mammographic images were processed with CAD. Targeted ultrasound is performed, showing a small round hypoechoic mass with some posterior acoustic shadowing, as well as ill-defined margins, in the left breast at 2:30 o'clock, 7 cm from the nipple, middle to posterior depth, measuring 3 mm in diameter. This corresponds in size and location to the mammographic mass. Sonographic evaluation of the left axilla shows no enlarged or abnormal lymph nodes. IMPRESSION: 1. Suspicious 3 mm mass in the lateral left breast. Tissue sampling is indicated. RECOMMENDATION: 1. Ultrasound-guided core needle biopsy of the small, 2:30 o'clock position, left breast mass. This procedure  was scheduled prior to patient being discharged from the breast Center. I have discussed the findings and recommendations with the patient. If applicable, a reminder letter will be sent to the patient regarding the next appointment. BI-RADS CATEGORY  4: Suspicious. Electronically Signed   By: Lajean Manes M.D.   On: 01/15/2020 15:14   MM DIAG BREAST TOMO UNI LEFT  Result Date: 01/15/2020 CLINICAL DATA:  Screening recall for a possible left breast mass. EXAM: DIGITAL DIAGNOSTIC LEFT MAMMOGRAM WITH CAD AND TOMO ULTRASOUND LEFT BREAST COMPARISON:  Previous exam(s). ACR Breast Density Category c: The breast tissue is heterogeneously dense, which may obscure small masses. FINDINGS: The possible mass, noted in the upper outer left breast on the current screening study, persists on the diagnostic spot-compression images. It appears as a small spiculated mass, approximately 4 mm in long axis, between 2 and 3 o'clock. There are no other suspicious masses. Mammographic images were processed with CAD. Targeted ultrasound is performed, showing a small round hypoechoic mass with some posterior acoustic shadowing, as well as ill-defined margins, in the left breast at 2:30 o'clock, 7 cm from the nipple, middle to posterior depth, measuring 3 mm in diameter. This corresponds in size and location to the mammographic mass. Sonographic evaluation of the left axilla shows no enlarged or abnormal lymph nodes. IMPRESSION: 1. Suspicious 3 mm mass in the lateral left breast. Tissue sampling is indicated. RECOMMENDATION: 1. Ultrasound-guided core needle biopsy of the small, 2:30 o'clock position, left breast mass. This procedure was scheduled prior to patient being discharged from the breast Center. I have discussed the findings and recommendations with the patient. If applicable, a reminder letter will be sent to the patient regarding the next appointment. BI-RADS CATEGORY  4: Suspicious. Electronically Signed   By: Lajean Manes  M.D.   On: 01/15/2020 15:14   MM CLIP PLACEMENT LEFT  Result Date: 01/28/2020 CLINICAL DATA:  64 year old female status post ultrasound-guided biopsy of the left breast. EXAM: DIAGNOSTIC LEFT MAMMOGRAM POST ULTRASOUND BIOPSY COMPARISON:  Previous exam(s). FINDINGS: Mammographic images were obtained following ultrasound guided biopsy of the left breast. The biopsy marking clip is in expected position at  the site of biopsy. IMPRESSION: Appropriate positioning of the ribbon shaped biopsy marking clip at the site of biopsy in the upper-outer left breast. Final Assessment: Post Procedure Mammograms for Marker Placement Electronically Signed   By: Kristopher Oppenheim M.D.   On: 01/28/2020 08:38   [place] LT BREAST BX W LOC DEV 1ST LESION IMG BX SPEC US GUIDE  Addendum Date: 01/29/2020   ADDENDUM REPORT: 01/29/2020 12:47 ADDENDUM: Pathology revealed GRADE II INVASIVE MAMMARY CARCINOMA, MAMMARY CARCINOMA IN SITU of the LEFT breast, 2:30 o'clock, 7cmfn. This was found to be concordant by Dr. Kristopher Oppenheim. Pathology results were discussed with the patient by telephone. The patient reported doing well after the biopsy with tenderness at the site. Post biopsy instructions and care were reviewed and questions were answered. The patient was encouraged to call [facility] for any additional concerns. The patient was referred to [facility] on February 06, 2020. Additional recommendation for contrast enhanced breast MRI given the patient's heterogeneous breast density. Pathology results reported by Stacie Acres RN on 01/29/2020. Electronically Signed   By: Kristopher Oppenheim M.D.   On: 01/29/2020 12:47   Result Date: 01/29/2020 CLINICAL DATA:  64 year old female with a suspicious left breast mass. EXAM: ULTRASOUND GUIDED LEFT BREAST CORE NEEDLE BIOPSY COMPARISON:  Previous exam(s). PROCEDURE: I met with the patient and we discussed  the procedure of ultrasound-guided biopsy, including benefits and alternatives. We discussed the high likelihood of a successful procedure. We discussed the risks of the procedure, including infection, bleeding, tissue injury, clip migration, and inadequate sampling. Informed written consent was given. The usual time-out protocol was performed immediately prior to the procedure. Lesion quadrant: Upper outer quadrant Using sterile technique and 1% Lidocaine as local anesthetic, under direct ultrasound visualization, a 14 gauge spring-loaded device was used to perform biopsy of a mass at the 2:30 position using a inferior approach. At the conclusion of the procedure ribbon shaped tissue marker clip was deployed into the biopsy cavity. Follow up 2 view mammogram was performed and dictated separately. IMPRESSION: Ultrasound guided biopsy of the left breast. No apparent complications. Electronically Signed: By: Kristopher Oppenheim M.D. On: 01/28/2020 08:38       IMPRESSION/PLAN: 1. Stage IA, [id number] grade 2, ER/PR positive invasive lobular carcinoma of the left breast. Dr. Lisbeth Renshaw discusses the pathology findings and reviews the nature of early stage left breast disease. The consensus from the breast conference includes breast conservation with lumpectomy with sentinel node biopsy. Depending on the size of the final tumor measurements rendered by pathology, the tumor may be tested for Oncotype Dx score to determine a role for systemic therapy. Provided that chemotherapy is not indicated, the patient's course would then be followed by external radiotherapy to the breast followed by antiestrogen therapy. We discussed the risks, benefits, short, and long term effects of radiotherapy, and the patient is interested in proceeding. Dr. Lisbeth Renshaw discusses the delivery and logistics of radiotherapy and anticipates a course of 4 or 6 1/2 weeks of radiotherapy, 4 weeks seems appropriate with her current work up to the left breast with  deep inspiration breath hold technique. We will see her back a few weeks after surgery to discuss the simulation process and anticipate we starting radiotherapy about 4-6 weeks after surgery.  2. Possible genetic predisposition to malignancy. The patient is a candidate for genetic testing given her personal and family history. She was offered referral and is interested  in meeting with genetic counseling today.   In a visit lasting 60 minutes, greater than 50% of the time was spent face to face reviewing her case, as well as in preparation of, discussing, and coordinating the patient's care.  The above documentation reflects my direct findings during this shared patient visit. Please see the separate note by Dr. Lisbeth Renshaw on this date for the remainder of the patient's plan of care.    Carola Rhine, PAC

## 2020-02-08 ENCOUNTER — Telehealth: Payer: Self-pay | Admitting: Oncology

## 2020-02-08 NOTE — Telephone Encounter (Signed)
Scheduled apt per 11/3 los - pt is aware of appt date and time

## 2020-02-11 ENCOUNTER — Other Ambulatory Visit: Payer: Self-pay

## 2020-02-11 ENCOUNTER — Encounter: Payer: Self-pay | Admitting: *Deleted

## 2020-02-11 ENCOUNTER — Other Ambulatory Visit: Payer: Self-pay | Admitting: Oncology

## 2020-02-11 ENCOUNTER — Telehealth: Payer: Self-pay | Admitting: *Deleted

## 2020-02-11 ENCOUNTER — Other Ambulatory Visit (HOSPITAL_COMMUNITY)
Admission: RE | Admit: 2020-02-11 | Discharge: 2020-02-11 | Disposition: A | Discharge status: Home / Self Care | Payer: 59 | Source: Ambulatory Visit | Attending: Surgery | Admitting: Surgery

## 2020-02-11 ENCOUNTER — Ambulatory Visit (HOSPITAL_COMMUNITY)
Admission: RE | Admit: 2020-02-11 | Discharge: 2020-02-11 | Disposition: A | Discharge status: Home / Self Care | Payer: 59 | Source: Ambulatory Visit | Attending: Oncology | Admitting: Oncology

## 2020-02-11 ENCOUNTER — Encounter (HOSPITAL_BASED_OUTPATIENT_CLINIC_OR_DEPARTMENT_OTHER): Payer: Self-pay | Admitting: Surgery

## 2020-02-11 DIAGNOSIS — C50412 Malignant neoplasm of upper-outer quadrant of left female breast: Secondary | ICD-10-CM | POA: Diagnosis not present

## 2020-02-11 DIAGNOSIS — Z01812 Encounter for preprocedural laboratory examination: Secondary | ICD-10-CM | POA: Insufficient documentation

## 2020-02-11 DIAGNOSIS — Z170 Estrogen receptor positive status [ER+]: Secondary | ICD-10-CM | POA: Diagnosis present

## 2020-02-11 DIAGNOSIS — Z20822 Contact with and (suspected) exposure to covid-19: Secondary | ICD-10-CM | POA: Insufficient documentation

## 2020-02-11 LAB — SARS CORONAVIRUS 2 (TAT 6-24 HRS): SARS Coronavirus 2: NEGATIVE

## 2020-02-11 MED ORDER — GADOBUTROL 1 MMOL/ML IV SOLN
8.0000 mL | Freq: Once | INTRAVENOUS | Status: AC | PRN
  Administered 2020-02-11: 8 mL via INTRAVENOUS

## 2020-02-11 NOTE — Telephone Encounter (Signed)
Spoke to pt concerning [place] from 11.3.21.Denies questions or concerns regarding dx or treatment care plan. Encourage pt to call with needs. Received verbal understanding.  Discussed MRI results. Physician team notified to review.

## 2020-02-12 ENCOUNTER — Other Ambulatory Visit: Payer: Self-pay | Admitting: Surgery

## 2020-02-12 ENCOUNTER — Ambulatory Visit
Admission: RE | Admit: 2020-02-12 | Discharge: 2020-02-12 | Disposition: A | Discharge status: Home / Self Care | Payer: 59 | Source: Ambulatory Visit | Attending: Surgery | Admitting: Surgery

## 2020-02-12 DIAGNOSIS — C50912 Malignant neoplasm of unspecified site of left female breast: Secondary | ICD-10-CM

## 2020-02-12 NOTE — Progress Notes (Signed)

## 2020-02-13 DIAGNOSIS — Z1379 Encounter for other screening for genetic and chromosomal anomalies: Secondary | ICD-10-CM | POA: Insufficient documentation

## 2020-02-14 ENCOUNTER — Other Ambulatory Visit: Payer: Self-pay

## 2020-02-14 ENCOUNTER — Ambulatory Visit (HOSPITAL_COMMUNITY)
Admission: RE | Admit: 2020-02-14 | Discharge: 2020-02-14 | Disposition: A | Discharge status: Home / Self Care | Payer: 59 | Source: Ambulatory Visit | Attending: Surgery | Admitting: Surgery

## 2020-02-14 ENCOUNTER — Ambulatory Visit
Admission: RE | Admit: 2020-02-14 | Discharge: 2020-02-14 | Disposition: A | Discharge status: Home / Self Care | Payer: 59 | Source: Ambulatory Visit | Attending: Surgery | Admitting: Surgery

## 2020-02-14 ENCOUNTER — Encounter (HOSPITAL_BASED_OUTPATIENT_CLINIC_OR_DEPARTMENT_OTHER)
Admission: RE | Disposition: A | Discharge status: Home / Self Care | Payer: Self-pay | Source: Home / Self Care | Attending: Surgery

## 2020-02-14 ENCOUNTER — Encounter (HOSPITAL_BASED_OUTPATIENT_CLINIC_OR_DEPARTMENT_OTHER): Payer: Self-pay | Admitting: Surgery

## 2020-02-14 ENCOUNTER — Ambulatory Visit (HOSPITAL_BASED_OUTPATIENT_CLINIC_OR_DEPARTMENT_OTHER)
Admission: RE | Admit: 2020-02-14 | Discharge: 2020-02-14 | Disposition: A | Discharge status: Home / Self Care | Payer: 59 | Attending: Surgery | Admitting: Surgery

## 2020-02-14 ENCOUNTER — Ambulatory Visit (HOSPITAL_BASED_OUTPATIENT_CLINIC_OR_DEPARTMENT_OTHER): Payer: 59 | Admitting: Anesthesiology

## 2020-02-14 DIAGNOSIS — C50912 Malignant neoplasm of unspecified site of left female breast: Secondary | ICD-10-CM

## 2020-02-14 DIAGNOSIS — C50412 Malignant neoplasm of upper-outer quadrant of left female breast: Secondary | ICD-10-CM | POA: Insufficient documentation

## 2020-02-14 HISTORY — DX: Anxiety disorder, unspecified: F41.9

## 2020-02-14 HISTORY — PX: BREAST LUMPECTOMY WITH RADIOACTIVE SEED AND SENTINEL LYMPH NODE BIOPSY: SHX6550

## 2020-02-14 SURGERY — BREAST LUMPECTOMY WITH RADIOACTIVE SEED AND SENTINEL LYMPH NODE BIOPSY
Anesthesia: General | Site: Breast | Laterality: Left

## 2020-02-14 MED ORDER — CEFAZOLIN SODIUM-DEXTROSE 2-4 GM/100ML-% IV SOLN
INTRAVENOUS | Status: AC
  Filled 2020-02-14: qty 100

## 2020-02-14 MED ORDER — MIDAZOLAM HCL 2 MG/2ML IJ SOLN
2.0000 mg | Freq: Once | INTRAMUSCULAR | Status: AC
  Administered 2020-02-14: 2 mg via INTRAVENOUS

## 2020-02-14 MED ORDER — FENTANYL CITRATE (PF) 100 MCG/2ML IJ SOLN
INTRAMUSCULAR | Status: AC
  Filled 2020-02-14: qty 2

## 2020-02-14 MED ORDER — LIDOCAINE 2% (20 MG/ML) 5 ML SYRINGE
INTRAMUSCULAR | Status: DC | PRN
  Administered 2020-02-14: 100 mg via INTRAVENOUS

## 2020-02-14 MED ORDER — HYDROCODONE-ACETAMINOPHEN 5-325 MG PO TABS: 1.0000 | ORAL_TABLET | Freq: Four times a day (QID) | ORAL | 0 refills | Status: DC | PRN

## 2020-02-14 MED ORDER — CHLORHEXIDINE GLUCONATE CLOTH 2 % EX PADS: 6.0000 | MEDICATED_PAD | Freq: Once | CUTANEOUS | Status: DC

## 2020-02-14 MED ORDER — GABAPENTIN 300 MG PO CAPS
ORAL_CAPSULE | ORAL | Status: AC
  Filled 2020-02-14: qty 1

## 2020-02-14 MED ORDER — FENTANYL CITRATE (PF) 100 MCG/2ML IJ SOLN
INTRAMUSCULAR | Status: DC | PRN
  Administered 2020-02-14: 25 ug via INTRAVENOUS

## 2020-02-14 MED ORDER — LACTATED RINGERS IV SOLN: INTRAVENOUS | Status: DC

## 2020-02-14 MED ORDER — GABAPENTIN 300 MG PO CAPS: 300.0000 mg | ORAL_CAPSULE | ORAL | Status: DC

## 2020-02-14 MED ORDER — ACETAMINOPHEN 500 MG PO TABS
ORAL_TABLET | ORAL | Status: AC
  Filled 2020-02-14: qty 2

## 2020-02-14 MED ORDER — BUPIVACAINE HCL 0.25 % IJ SOLN
INTRAMUSCULAR | Status: DC | PRN
  Administered 2020-02-14: 10 mL

## 2020-02-14 MED ORDER — PROPOFOL 10 MG/ML IV BOLUS
INTRAVENOUS | Status: DC | PRN
  Administered 2020-02-14: 130 mg via INTRAVENOUS

## 2020-02-14 MED ORDER — DEXAMETHASONE SODIUM PHOSPHATE 10 MG/ML IJ SOLN
INTRAMUSCULAR | Status: DC | PRN
  Administered 2020-02-14: 8 mg via INTRAVENOUS

## 2020-02-14 MED ORDER — ACETAMINOPHEN 500 MG PO TABS: 1000.0000 mg | ORAL_TABLET | ORAL | Status: DC

## 2020-02-14 MED ORDER — TECHNETIUM TC 99M TILMANOCEPT KIT
1.0000 | PACK | Freq: Once | INTRAVENOUS | Status: AC | PRN
  Administered 2020-02-14: 1 via INTRADERMAL

## 2020-02-14 MED ORDER — LIDOCAINE 2% (20 MG/ML) 5 ML SYRINGE
INTRAMUSCULAR | Status: AC
  Filled 2020-02-14: qty 5

## 2020-02-14 MED ORDER — IBUPROFEN 800 MG PO TABS: 800.0000 mg | ORAL_TABLET | Freq: Three times a day (TID) | ORAL | 0 refills | Status: DC | PRN

## 2020-02-14 MED ORDER — EPHEDRINE SULFATE-NACL 50-0.9 MG/10ML-% IV SOSY
PREFILLED_SYRINGE | INTRAVENOUS | Status: DC | PRN
  Administered 2020-02-14: 10 mg via INTRAVENOUS

## 2020-02-14 MED ORDER — CEFAZOLIN SODIUM-DEXTROSE 2-4 GM/100ML-% IV SOLN
2.0000 g | INTRAVENOUS | Status: AC
  Administered 2020-02-14: 2 g via INTRAVENOUS

## 2020-02-14 MED ORDER — MIDAZOLAM HCL 2 MG/2ML IJ SOLN
INTRAMUSCULAR | Status: AC
  Filled 2020-02-14: qty 2

## 2020-02-14 MED ORDER — ONDANSETRON HCL 4 MG/2ML IJ SOLN
INTRAMUSCULAR | Status: DC | PRN
  Administered 2020-02-14: 4 mg via INTRAVENOUS

## 2020-02-14 MED ORDER — CLONIDINE HCL (ANALGESIA) 100 MCG/ML EP SOLN
EPIDURAL | Status: DC | PRN
  Administered 2020-02-14: 100 ug

## 2020-02-14 MED ORDER — DEXAMETHASONE SODIUM PHOSPHATE 10 MG/ML IJ SOLN
INTRAMUSCULAR | Status: AC
  Filled 2020-02-14: qty 1

## 2020-02-14 MED ORDER — ROPIVACAINE HCL 5 MG/ML IJ SOLN
INTRAMUSCULAR | Status: DC | PRN
  Administered 2020-02-14: 30 mL

## 2020-02-14 MED ORDER — FENTANYL CITRATE (PF) 100 MCG/2ML IJ SOLN
100.0000 ug | Freq: Once | INTRAMUSCULAR | Status: AC
  Administered 2020-02-14: 50 ug via INTRAVENOUS

## 2020-02-14 MED ORDER — EPHEDRINE 5 MG/ML INJ
INTRAVENOUS | Status: AC
  Filled 2020-02-14: qty 10

## 2020-02-14 MED ORDER — PROPOFOL 10 MG/ML IV BOLUS
INTRAVENOUS | Status: AC
  Filled 2020-02-14: qty 20

## 2020-02-14 MED ORDER — ONDANSETRON HCL 4 MG/2ML IJ SOLN
INTRAMUSCULAR | Status: AC
  Filled 2020-02-14: qty 2

## 2020-02-14 SURGICAL SUPPLY — 44 items
ADH SKN CLS APL DERMABOND .7 (GAUZE/BANDAGES/DRESSINGS) ×1
APL PRP STRL LF DISP 70% ISPRP (MISCELLANEOUS) ×1
APPLIER CLIP 9.375 MED OPEN (MISCELLANEOUS) ×3
APR CLP MED 9.3 20 MLT OPN (MISCELLANEOUS) ×1
BINDER BREAST XLRG (GAUZE/BANDAGES/DRESSINGS) ×2 IMPLANT
BLADE SURG 15 STRL LF DISP TIS (BLADE) ×1 IMPLANT
BLADE SURG 15 STRL SS (BLADE) ×3
CANISTER SUCT 1200ML W/VALVE (MISCELLANEOUS) ×5 IMPLANT
CHLORAPREP W/TINT 26 (MISCELLANEOUS) ×3 IMPLANT
CLIP APPLIE 9.375 MED OPEN (MISCELLANEOUS) ×1 IMPLANT
COVER BACK TABLE 60X90IN (DRAPES) ×3 IMPLANT
COVER MAYO STAND STRL (DRAPES) ×3 IMPLANT
COVER PROBE W GEL 5X96 (DRAPES) ×3 IMPLANT
DERMABOND ADVANCED (GAUZE/BANDAGES/DRESSINGS) ×2
DERMABOND ADVANCED .7 DNX12 (GAUZE/BANDAGES/DRESSINGS) ×1 IMPLANT
DRAPE LAPAROSCOPIC ABDOMINAL (DRAPES) ×3 IMPLANT
DRAPE UTILITY XL STRL (DRAPES) ×3 IMPLANT
ELECT COATED BLADE 2.86 ST (ELECTRODE) ×3 IMPLANT
ELECT REM PT RETURN 9FT ADLT (ELECTROSURGICAL) ×3
ELECTRODE REM PT RTRN 9FT ADLT (ELECTROSURGICAL) ×1 IMPLANT
GLOVE BIO SURGEON STRL SZ 6.5 (GLOVE) ×1 IMPLANT
GLOVE BIO SURGEONS STRL SZ 6.5 (GLOVE) ×1
GLOVE BIOGEL PI IND STRL 8 (GLOVE) ×1 IMPLANT
GLOVE BIOGEL PI INDICATOR 8 (GLOVE) ×2
GLOVE ECLIPSE 8.0 STRL XLNG CF (GLOVE) ×3 IMPLANT
GOWN STRL REUS W/ TWL LRG LVL3 (GOWN DISPOSABLE) ×2 IMPLANT
GOWN STRL REUS W/TWL LRG LVL3 (GOWN DISPOSABLE) ×6
HEMOSTAT ARISTA ABSORB 3G PWDR (HEMOSTASIS) ×2 IMPLANT
KIT MARKER MARGIN INK (KITS) ×3 IMPLANT
NDL HYPO 25X1 1.5 SAFETY (NEEDLE) ×1 IMPLANT
NEEDLE HYPO 25X1 1.5 SAFETY (NEEDLE) ×3 IMPLANT
NS IRRIG 1000ML POUR BTL (IV SOLUTION) ×5 IMPLANT
PACK BASIN DAY SURGERY FS (CUSTOM PROCEDURE TRAY) ×3 IMPLANT
PENCIL SMOKE EVACUATOR (MISCELLANEOUS) ×3 IMPLANT
SLEEVE SCD COMPRESS KNEE MED (MISCELLANEOUS) ×3 IMPLANT
SPONGE LAP 4X18 RFD (DISPOSABLE) ×3 IMPLANT
SUT MNCRL AB 4-0 PS2 18 (SUTURE) ×3 IMPLANT
SUT VICRYL 3-0 CR8 SH (SUTURE) ×3 IMPLANT
SYR CONTROL 10ML LL (SYRINGE) ×3 IMPLANT
TOWEL GREEN STERILE FF (TOWEL DISPOSABLE) ×3 IMPLANT
TRAY FAXITRON CT DISP (TRAY / TRAY PROCEDURE) ×3 IMPLANT
TUBE CONNECTING 20'X1/4 (TUBING) ×2
TUBE CONNECTING 20X1/4 (TUBING) ×3 IMPLANT
YANKAUER SUCT BULB TIP NO VENT (SUCTIONS) ×5 IMPLANT

## 2020-02-14 NOTE — Anesthesia Postprocedure Evaluation (Signed)
Anesthesia Post Note  Patient: Carolyn Cooper  Procedure(s) Performed: LEFT BREAST LUMPECTOMY WITH RADIOACTIVE SEED AND SENTINEL LYMPH NODE MAPPING (Left Breast)     Anesthesia Type: General Anesthetic complications: no   No complications documented.  Last Vitals:  Vitals:   02/14/20 1415 02/14/20 1442  BP: 127/65 124/66  Pulse: 77 79  Resp: 15 16  Temp:  36.5 C  SpO2: 97% 97%    Last Pain:  Vitals:   02/14/20 1442  TempSrc:   PainSc: 0-No pain                 Barnet Glasgow

## 2020-02-14 NOTE — Discharge Instructions (Signed)
[facility] °Office Phone Number [phone] ° °BREAST BIOPSY/ PARTIAL MASTECTOMY: POST OP INSTRUCTIONS ° °Always review your discharge instruction sheet given to you by the facility where your surgery was performed. ° °IF YOU HAVE DISABILITY OR FAMILY LEAVE FORMS, YOU MUST BRING THEM TO THE OFFICE FOR PROCESSING.  DO NOT GIVE THEM TO YOUR DOCTOR. ° °1. A prescription for pain medication may be given to you upon discharge.  Take your pain medication as prescribed, if needed.  If narcotic pain medicine is not needed, then you may take acetaminophen (Tylenol) or ibuprofen (Advil) as needed. °2. Take your usually prescribed medications unless otherwise directed °3. If you need a refill on your pain medication, please contact your pharmacy.  They will contact our office to request authorization.  Prescriptions will not be filled after 5pm or on week-ends. °4. You should eat very light the first 24 hours after surgery, such as soup, crackers, pudding, etc.  Resume your normal diet the day after surgery. °5. Most patients will experience some swelling and bruising in the breast.  Ice packs and a good support bra will help.  Swelling and bruising can take several days to resolve.  °6. It is common to experience some constipation if taking pain medication after surgery.  Increasing fluid intake and taking a stool softener will usually help or prevent this problem from occurring.  A mild laxative (Milk of Magnesia or Miralax) should be taken according to package directions if there are no bowel movements after 48 hours. °7. Unless discharge instructions indicate otherwise, you may remove your bandages 24-48 hours after surgery, and you may shower at that time.  You may have steri-strips (small skin tapes) in place directly over the incision.  These strips should be left on the skin for 7-10 days.  If your surgeon used skin glue on the incision, you may shower in 24 hours.  The glue will flake off over the  next 2-3 weeks.  Any sutures or staples will be removed at the office during your follow-up visit. °8. ACTIVITIES:  You may resume regular daily activities (gradually increasing) beginning the next day.  Wearing a good support bra or sports bra minimizes pain and swelling.  You may have sexual intercourse when it is comfortable. °a. You may drive when you no longer are taking prescription pain medication, you can comfortably wear a seatbelt, and you can safely maneuver your car and apply brakes. °b. RETURN TO WORK:  ______________________________________________________________________________________ °9. You should see your doctor in the office for a follow-up appointment approximately two weeks after your surgery.  Your doctor’s nurse will typically make your follow-up appointment when she calls you with your pathology report.  Expect your pathology report 2-3 business days after your surgery.  You may call to check if you do not hear from us after three days. °10. OTHER INSTRUCTIONS: _______________________________________________________________________________________________ _____________________________________________________________________________________________________________________________________ °_____________________________________________________________________________________________________________________________________ °_____________________________________________________________________________________________________________________________________ ° °WHEN TO CALL YOUR DOCTOR: °1. Fever over 101.0 °2. Nausea and/or vomiting. °3. Extreme swelling or bruising. °4. Continued bleeding from incision. °5. Increased pain, redness, or drainage from the incision. ° °The clinic staff is available to answer your questions during regular business hours.  Please don’t hesitate to call and ask to speak to one of the nurses for clinical concerns.  If you have a medical emergency, go to the nearest  emergency room or call 911.  A surgeon from [facility] is always on call at the hospital. ° °For further questions, please visit [email or web address]  ° ° ° ° °  Post Anesthesia Home Care Instructions ° °Activity: °Get plenty of rest for the remainder of the day. A responsible individual must stay with you for 24 hours following the procedure.  °For the next 24 hours, DO NOT: °-Drive a car °-Operate machinery °-Drink alcoholic beverages °-Take any medication unless instructed by your physician °-Make any legal decisions or sign important papers. ° °Meals: °Start with liquid foods such as gelatin or soup. Progress to regular foods as tolerated. Avoid greasy, spicy, heavy foods. If nausea and/or vomiting occur, drink only clear liquids until the nausea and/or vomiting subsides. Call your physician if vomiting continues. ° °Special Instructions/Symptoms: °Your throat may feel dry or sore from the anesthesia or the breathing tube placed in your throat during surgery. If this causes discomfort, gargle with warm salt water. The discomfort should disappear within 24 hours. ° °If you had a scopolamine patch placed behind your ear for the management of post- operative nausea and/or vomiting: ° °1. The medication in the patch is effective for 72 hours, after which it should be removed.  Wrap patch in a tissue and discard in the trash. Wash hands thoroughly with soap and water. °2. You may remove the patch earlier than 72 hours if you experience unpleasant side effects which may include dry mouth, dizziness or visual disturbances. °3. Avoid touching the patch. Wash your hands with soap and water after contact with the patch. °  ° °

## 2020-02-14 NOTE — Anesthesia Procedure Notes (Signed)
Procedure Name: LMA Insertion Date/Time: 02/14/2020 12:52 PM Performed by: Niel Hummer, CRNA Pre-anesthesia Checklist: Patient identified, Emergency Drugs available, Suction available and Patient being monitored Patient Re-evaluated:Patient Re-evaluated prior to induction Oxygen Delivery Method: Circle system utilized Preoxygenation: Pre-oxygenation with 100% oxygen Induction Type: IV induction LMA: LMA inserted LMA Size: 4.0 Number of attempts: 1 Dental Injury: Teeth and Oropharynx as per pre-operative assessment

## 2020-02-14 NOTE — Anesthesia Postprocedure Evaluation (Signed)
Anesthesia Post Note  Patient: Carolyn Cooper  Procedure(s) Performed: LEFT BREAST LUMPECTOMY WITH RADIOACTIVE SEED AND SENTINEL LYMPH NODE MAPPING (Left Breast)     Patient location during evaluation: PACU Anesthesia Type: General Level of consciousness: awake and alert Pain management: pain level controlled Vital Signs Assessment: post-procedure vital signs reviewed and stable Respiratory status: spontaneous breathing, nonlabored ventilation, respiratory function stable and patient connected to nasal cannula oxygen Cardiovascular status: blood pressure returned to baseline and stable Postop Assessment: no apparent nausea or vomiting Anesthetic complications: no   No complications documented.  Last Vitals:  Vitals:   02/14/20 1415 02/14/20 1442  BP: 127/65 124/66  Pulse: 77 79  Resp: 15 16  Temp:  36.5 C  SpO2: 97% 97%    Last Pain:  Vitals:   02/14/20 1442  TempSrc:   PainSc: 0-No pain                 Barnet Glasgow

## 2020-02-14 NOTE — Op Note (Signed)
Preoperative diagnosis: Stage I left breast cancer upper outer quadrant  Postoperative diagnosis: Same  Procedure: Left breast seed localized lumpectomy with left axillary sentinel lymph node mapping  Surgeon: Erroll Luna, MD  Anesthesia: General with pectoral block with local consisting of 0.25% Marcaine  EBL: Minimal  Specimen: Left breast tissue with seed and clip verified by Faxitron and 1 single left axillary sentinel node  Drains: None  IV fluids: Per anesthesia record  Indications for procedure: Patient presents for left breast lumpectomy for stage I left breast cancer after reviewing all of her options and being seen by medical oncology, surgery in radiation oncology.  The pros and cons of breast conserving surgery as well as long-term expectations, survival, and treatment options discussed.  This is contrasted with mastectomy with reconstruction.  Risk of all procedure were discussed.  Patient opted for left breast seed localized lumpectomy with sentinel lymph node mapping.Sentinel lymph node mapping and dissection has been discussed with the patient.  Risk of bleeding,  Infection,  Seroma formation,  Additional procedures,,  Shoulder weakness ,  Shoulder stiffness,  Nerve and blood vessel injury and reaction to the mapping dyes have been discussed.  Alternatives to surgery have been discussed with the patient.  The patient agrees to proceed.The procedure has been discussed with the patient. Alternatives to surgery have been discussed with the patient.  Risks of surgery include bleeding,  Infection,  Seroma formation, death,  and the need for further surgery.   The patient understands and wishes to proceed.     Description of procedure: The patient was met in the holding area and questions were answered.  Neoprobe used to verify seed location left breast upper outer quadrant.  Nuclear medicine was present for injection of technetium sulfur colloid and pectoral block placed per  anesthesia.  All questions were answered left breast was marked as the correct site.  She was brought back to the operating.  She is placed supine upon the OR table.  Induction general esthesia of the left breast was prepped and draped in sterile fashion timeout performed.  Proper patient, site and procedure were verified.  She is received appropriate preoperative antibiotics.  Neoprobe used to identify the seed.  Films available for review.  The incision made left breast upper outer quadrant.  Dissection carried down all tissue and the seed and clip were excised with a grossly negative margin.  Additional anterior margin was taken.  Faxitron revealed the seed and clip within the specimen and the specimen was oriented with ink and sent to pathology.  Hemostasis was achieved.  Neoprobe used through the same incision and we are able to dissect into the left insula.  A single hot sentinel node identified removed.  Background counts approached 0.  This was a level 1 node.  Irrigation was used and hemostasis achieved.  Arista was placed in the wound.  Wound then closed with 3-0 Vicryl and 4-0 Monocryl.  Dermabond applied.  All counts found to be correct.  Breast binder placed.  The patient was awoke extubated taken to recovery in satisfactory condition.

## 2020-02-14 NOTE — Transfer of Care (Signed)
Immediate Anesthesia Transfer of Care Note  Patient: Carolyn Cooper  Procedure(s) Performed: LEFT BREAST LUMPECTOMY WITH RADIOACTIVE SEED AND SENTINEL LYMPH NODE MAPPING (Left Breast)  Patient Location: PACU  Anesthesia Type:General  Level of Consciousness: awake  Airway & Oxygen Therapy: Patient Spontanous Breathing and Patient connected to face mask oxygen  Post-op Assessment: Report given to RN, Post -op Vital signs reviewed and stable and Patient moving all extremities X 4  Post vital signs: Reviewed and stable  Last Vitals:  Vitals Value Taken Time  BP    Temp    Pulse 82 02/14/20 1345  Resp 15 02/14/20 1345  SpO2 97 % 02/14/20 1345  Vitals shown include unvalidated device data.  Last Pain:  Vitals:   02/14/20 1157  TempSrc: Oral  PainSc: 0-No pain         Complications: No complications documented.

## 2020-02-14 NOTE — Progress Notes (Signed)
Assisted Dr. Valma Cava with left, ultrasound guided, pectoralis block. Side rails up, monitors on throughout procedure. See vital signs in flow sheet. Tolerated Procedure well.

## 2020-02-14 NOTE — Anesthesia Procedure Notes (Signed)
Anesthesia Regional Block: Pectoralis block   Pre-Anesthetic Checklist: ,, timeout performed, Correct Patient, Correct Site, Correct Laterality, Correct Procedure, Correct Position, site marked, Risks and benefits discussed,  Surgical consent,  Pre-op evaluation,  At surgeon's request and post-op pain management  Laterality: Left and Upper  Prep: chloraprep       Needles:  Injection technique: Single-shot  Needle Type: Echogenic Needle     Needle Length: 9cm  Needle Gauge: 21     Additional Needles:   Procedures:,,,, ultrasound used (permanent image in chart),,,,  Narrative:  Start time: 02/14/2020 12:20 PM End time: 02/14/2020 12:28 PM Injection made incrementally with aspirations every 5 mL.  Performed by: Personally  Anesthesiologist: Barnet Glasgow, MD  Additional Notes: Block assessed. Patient tolerated procedure well.

## 2020-02-14 NOTE — Anesthesia Preprocedure Evaluation (Addendum)
Anesthesia Evaluation  Patient identified by MRN, date of birth, ID band Patient awake    Reviewed: Allergy & Precautions, NPO status , Patient's Chart, lab work & pertinent test results  Airway Mallampati: II  TM Distance: >3 FB Neck ROM: Full    Dental no notable dental hx. (+) Teeth Intact, Dental Advisory Given   Pulmonary neg pulmonary ROS,    Pulmonary exam normal breath sounds clear to auscultation       Cardiovascular Exercise Tolerance: Good negative cardio ROS Normal cardiovascular exam Rhythm:Regular Rate:Normal     Neuro/Psych  Headaches, PSYCHIATRIC DISORDERS Anxiety    GI/Hepatic Neg liver ROS, GERD  ,  Endo/Other  negative endocrine ROS  Renal/GU negative Renal ROS     Musculoskeletal negative musculoskeletal ROS (+)   Abdominal   Peds  Hematology negative hematology ROS (+)   Anesthesia Other Findings   Reproductive/Obstetrics                            Anesthesia Physical Anesthesia Plan  ASA: III  Anesthesia Plan: General   Post-op Pain Management:  Regional for Post-op pain   Induction:   PONV Risk Score and Plan: Treatment may vary due to age or medical condition, Ondansetron, Dexamethasone and Midazolam  Airway Management Planned: LMA  Additional Equipment: None  Intra-op Plan:   Post-operative Plan: Extubation in OR  Informed Consent: I have reviewed the patients History and Physical, chart, labs and discussed the procedure including the risks, benefits and alternatives for the proposed anesthesia with the patient or authorized representative who has indicated his/her understanding and acceptance.     Dental advisory given  Plan Discussed with: CRNA and Anesthesiologist  Anesthesia Plan Comments:         Anesthesia Quick Evaluation

## 2020-02-14 NOTE — Interval H&P Note (Signed)
History and Physical Interval Note:  02/14/2020 12:24 PM  Carolyn Cooper  has presented today for surgery, with the diagnosis of LEFT BREAST CANCER.  The various methods of treatment have been discussed with the patient and family. After consideration of risks, benefits and other options for treatment, the patient has consented to  Procedure(s) with comments: LEFT BREAST LUMPECTOMY WITH RADIOACTIVE SEED AND SENTINEL LYMPH NODE MAPPING (Left) - PECTORAL BLOCK as a surgical intervention.  The patient's history has been reviewed, patient examined, no change in status, stable for surgery.  I have reviewed the patient's chart and labs.  Questions were answered to the patient's satisfaction.     Turner Daniels MD

## 2020-02-14 NOTE — Progress Notes (Signed)
Nuc med inj performed by nuc med staff. No additional sedation required. Pt tol well, VSS, emotional support provided.

## 2020-02-15 ENCOUNTER — Encounter (HOSPITAL_BASED_OUTPATIENT_CLINIC_OR_DEPARTMENT_OTHER): Payer: Self-pay | Admitting: Surgery

## 2020-02-15 ENCOUNTER — Encounter: Payer: Self-pay | Admitting: Genetic Counselor

## 2020-02-15 ENCOUNTER — Ambulatory Visit: Payer: Self-pay | Admitting: Genetic Counselor

## 2020-02-15 ENCOUNTER — Telehealth: Payer: Self-pay | Admitting: Genetic Counselor

## 2020-02-15 DIAGNOSIS — Z1379 Encounter for other screening for genetic and chromosomal anomalies: Secondary | ICD-10-CM

## 2020-02-15 NOTE — Progress Notes (Signed)
HPI:  Carolyn Cooper was previously seen in the [place] [facility] due to a personal and family history of cancer and concerns regarding a hereditary predisposition to cancer. Please refer to our prior cancer genetics clinic note for more information regarding our discussion, assessment and recommendations, at the time. Carolyn Cooper recent genetic test results were disclosed to her, as were recommendations warranted by these results. These results and recommendations are discussed in more detail below.  CANCER HISTORY:  Oncology History  Malignant neoplasm of upper-outer quadrant of left breast in female, estrogen receptor positive ([place])  01/30/2020 Initial Diagnosis   Malignant neoplasm of upper-outer quadrant of left breast in female, estrogen receptor positive ([place])   02/13/2020 Genetic Testing   Negative genetic testing:  No pathogenic variants detected on the [facility] Breast Cancer STAT panel or Common Hereditary Cancers panel. The report date is 02/13/2020.   The Breast Cancer STAT Panel offered by [facility] includes sequencing and deletion/duplication analysis for the following 9 genes:  ATM, BRCA1, BRCA2, CDH1, CHEK2, PALB2, PTEN, STK11 and TP53. The Common Hereditary Cancers Panel offered by [facility] includes sequencing and/or deletion duplication testing of the following 48 genes: APC, ATM, AXIN2, BARD1, BMPR1A, BRCA1, BRCA2, BRIP1, CDH1, CDK4, CDKN2A (p14ARF), CDKN2A (p16INK4a), CHEK2, CTNNA1, DICER1, EPCAM (Deletion/duplication testing only), GREM1 (promoter region deletion/duplication testing only), KIT, MEN1, MLH1, MSH2, MSH3, MSH6, MUTYH, NBN, NF1, NTHL1, PALB2, PDGFRA, PMS2, POLD1, POLE, PTEN, RAD50, RAD51C, RAD51D, RNF43, SDHB, SDHC, SDHD, SMAD4, SMARCA4. STK11, TP53, TSC1, TSC2, and VHL.  The following genes were evaluated for sequence changes only: SDHA and HOXB13 c.251G>A variant only.     FAMILY HISTORY:  We obtained a detailed, 4-generation family history.  Significant  diagnoses are listed below: Family History  Problem Relation Age of Onset   Ovarian cancer Maternal Grandmother 64   Diverticulitis Mother    Heart disease Father    Kidney cancer Father 71   Stomach cancer Maternal Grandfather        dx 67s   Breast cancer Other        dx >50, maternal great-aunt   Breast cancer Other        dx >50, maternal great-aunt   Breast cancer Other        dx >50, maternal great-aunt   Colon cancer Neg Hx    Esophageal cancer Neg Hx    Rectal cancer Neg Hx    Carolyn Cooper has two daughters and two sons (ages 61-39). She has one sister (age 58). None of these family members have had cancer.  Carolyn Cooper mother is 49 and has not had cancer. She had genetic testing many years ago of the BRCA1 and BRCA2 genes, which was negative. Carolyn Cooper has one maternal aunt and one maternal uncle. Her maternal grandmother died in her 37s from ovarian cancer. Her maternal grandmother had three sisters who were diagnosed with breast cancer older than 39. Carolyn Cooper maternal grandfather died from stomach cancer diagnosed in his 1s.   Carolyn Cooper father is 26 and was diagnosed with kidney cancer last year at the age of 30. Her father did not have any siblings. Her paternal grandmother died at the age of 77 and was not diagnosed with cancer, although Carolyn Cooper notes that her grandmother had an abscess on her breast while living in a nursing home which was not evaluated. Her paternal grandfather died at the age of 27 from heart problems.   Carolyn Cooper is aware of previous family history  of genetic testing for hereditary cancer risks in her mother. Patient's maternal and paternal ancestors are of English descent. There is no reported Ashkenazi Jewish ancestry. There is no known consanguinity.  GENETIC TEST RESULTS: Genetic testing reported out on 02/13/2020 through the [facility] Breast Cancer STAT panel and Common Hereditary Cancers panel. No pathogenic variants were detected.     The Breast Cancer STAT Panel offered by [facility] includes sequencing and deletion/duplication analysis for the following 9 genes:  ATM, BRCA1, BRCA2, CDH1, CHEK2, PALB2, PTEN, STK11 and TP53. The Common Hereditary Cancers Panel offered by [facility] includes sequencing and/or deletion duplication testing of the following 48 genes: APC, ATM, AXIN2, BARD1, BMPR1A, BRCA1, BRCA2, BRIP1, CDH1, CDK4, CDKN2A (p14ARF), CDKN2A (p16INK4a), CHEK2, CTNNA1, DICER1, EPCAM (Deletion/duplication testing only), GREM1 (promoter region deletion/duplication testing only), KIT, MEN1, MLH1, MSH2, MSH3, MSH6, MUTYH, NBN, NF1, NTHL1, PALB2, PDGFRA, PMS2, POLD1, POLE, PTEN, RAD50, RAD51C, RAD51D, RNF43, SDHB, SDHC, SDHD, SMAD4, SMARCA4. STK11, TP53, TSC1, TSC2, and VHL.  The following genes were evaluated for sequence changes only: SDHA and HOXB13 c.251G>A variant only. The test report will be scanned into EPIC and located under the Molecular Pathology section of the Results Review tab.  A portion of the result report is included below for reference.     We discussed with Carolyn Cooper that because current genetic testing is not perfect, it is possible there may be a gene mutation in one of these genes that current testing cannot detect, but that chance is small.  We also discussed that there could be another gene that has not yet been discovered, or that we have not yet tested, that is responsible for the cancer diagnoses in the family. It is also possible there is a hereditary cause for the cancer in the family that Carolyn Cooper did not inherit and therefore was not identified in her testing.  Therefore, it is important to remain in touch with cancer genetics in the future so that we can continue to offer Carolyn Cooper the most up to date genetic testing.   CANCER SCREENING RECOMMENDATIONS: Carolyn Cooper test result is considered negative (normal).  This means that we have not identified a hereditary cause for her personal and family history of  cancer at this time. While reassuring, this does not definitively rule out a hereditary predisposition to cancer. It is still possible that there could be genetic mutations that are undetectable by current technology. There could be genetic mutations in genes that have not been tested or identified to increase cancer risk.  Therefore, it is recommended she continue to follow the cancer management and screening guidelines provided by her oncology and primary healthcare provider.   An individual's cancer risk and medical management are not determined by genetic test results alone. Overall cancer risk assessment incorporates additional factors, including personal medical history, family history, and any available genetic information that may result in a personalized plan for cancer prevention and surveillance.  RECOMMENDATIONS FOR FAMILY MEMBERS:  Individuals in this family might be at some increased risk of developing cancer, over the general population risk, simply due to the family history of cancer.  We recommended women in this family have a yearly mammogram beginning at age 27, or 46 years younger than the earliest onset of cancer, an annual clinical breast exam, and perform monthly breast self-exams. Women in this family should also have a gynecological exam as recommended by their primary provider. All family members should be referred for colonoscopy starting at age 103.  It  is also possible there is a hereditary cause for the cancer in Ms. Goyer's family that she did not inherit and therefore was not identified in her.  Based on Ms. Pitter's family history, we recommended her mother and her mother's siblings have genetic counseling and testing. Ms. Creasey will let us know if we can be of any assistance in coordinating genetic counseling and/or testing for this family member.   FOLLOW-UP: Lastly, we discussed with Ms. Bunten that cancer genetics is a rapidly advancing field and it is possible that new  genetic tests will be appropriate for her and/or her family members in the future. We encouraged her to remain in contact with cancer genetics on an annual basis so we can update her personal and family histories and let her know of advances in cancer genetics that may benefit this family.   Our contact number was provided. Ms. Tess questions were answered to her satisfaction, and she knows she is welcome to call us at anytime with additional questions or concerns.   Clint Guy, MS, [facility] Genetic Counselor [email or web address] Phone: [phone]

## 2020-02-15 NOTE — Telephone Encounter (Signed)
Revealed negative genetic testing. Discussed that we do not know why she has breast cancer or why there is cancer in the family. There could be a genetic mutation in the family that Carolyn Cooper did not inherit. There could also be a mutation in a different gene that we are not testing, or our current technology may not be able detect certain mutations. It will therefore be important for her to stay in contact with genetics to keep up with whether additional testing may be appropriate in the future.

## 2020-02-18 NOTE — Addendum Note (Signed)
Addendum  created 02/18/20 1224 by Sherill Wegener, Ernesta Amble, CRNA   Charge Capture section accepted

## 2020-02-19 LAB — SURGICAL PATHOLOGY

## 2020-02-20 ENCOUNTER — Encounter: Payer: Self-pay | Admitting: *Deleted

## 2020-02-20 ENCOUNTER — Telehealth: Payer: Self-pay | Admitting: *Deleted

## 2020-02-20 NOTE — Telephone Encounter (Signed)
Received order for oncotype testing. Requisition faxed to [facility] and pathology

## 2020-02-26 NOTE — Progress Notes (Signed)
Nutrition  Patient identified by attending Breast Clinic on 02/06/20.  Patient was given nutrition packet with RD contact information by nurse navigator.   Chart reviewed.   64 year old female with new left breast cancer.  S/p lumpectomy on 11/11 with onctype testing.  Planning radiation and antiestrogens  Ht: 65.5 inches Wt: 176 lb BMI 28  Patient currently not at nutritional risk.  Please consult RD if changes in nutritional status occur.  Carolyn Cooper, [place], [place] Registered Dietitian [phone] (mobile)

## 2020-03-03 ENCOUNTER — Encounter: Payer: Self-pay | Admitting: *Deleted

## 2020-03-03 NOTE — Progress Notes (Signed)
Received Prior Authorization number for Oncotype Testing [id number]

## 2020-03-06 ENCOUNTER — Encounter: Payer: Self-pay | Admitting: *Deleted

## 2020-03-11 ENCOUNTER — Telehealth: Payer: Self-pay | Admitting: *Deleted

## 2020-03-11 ENCOUNTER — Encounter: Payer: Self-pay | Admitting: *Deleted

## 2020-03-11 DIAGNOSIS — C50412 Malignant neoplasm of upper-outer quadrant of left female breast: Secondary | ICD-10-CM

## 2020-03-11 NOTE — Telephone Encounter (Signed)
Received oncotype score of 16. Physician team notified. Called pt with results and discussed chemo not recommended based on the results and next step is xrt. Received verbal understanding. Referral placed for pt to see Dr. Lisbeth Renshaw.

## 2020-03-13 ENCOUNTER — Telehealth: Payer: Self-pay | Admitting: *Deleted

## 2020-03-13 NOTE — Telephone Encounter (Signed)
LVM for call back to schedule appointment with Dr. Moody. 

## 2020-03-17 ENCOUNTER — Other Ambulatory Visit: Payer: Self-pay | Admitting: Family Medicine

## 2020-03-17 ENCOUNTER — Ambulatory Visit: Payer: 59 | Attending: Surgery | Admitting: Physical Therapy

## 2020-03-17 ENCOUNTER — Other Ambulatory Visit: Payer: Self-pay

## 2020-03-17 ENCOUNTER — Other Ambulatory Visit: Payer: Self-pay | Admitting: *Deleted

## 2020-03-17 ENCOUNTER — Encounter: Payer: Self-pay | Admitting: Physical Therapy

## 2020-03-17 ENCOUNTER — Encounter: Payer: Self-pay | Admitting: Family Medicine

## 2020-03-17 DIAGNOSIS — Z483 Aftercare following surgery for neoplasm: Secondary | ICD-10-CM | POA: Diagnosis present

## 2020-03-17 DIAGNOSIS — R293 Abnormal posture: Secondary | ICD-10-CM | POA: Insufficient documentation

## 2020-03-17 DIAGNOSIS — Z170 Estrogen receptor positive status [ER+]: Secondary | ICD-10-CM | POA: Diagnosis present

## 2020-03-17 DIAGNOSIS — C50412 Malignant neoplasm of upper-outer quadrant of left female breast: Secondary | ICD-10-CM | POA: Diagnosis present

## 2020-03-17 DIAGNOSIS — M25551 Pain in right hip: Secondary | ICD-10-CM

## 2020-03-17 NOTE — Telephone Encounter (Signed)
We have imitrex listed for her.  Does she wish to dc that one and go back to [place]? I thought she was doing well on Nurtec?

## 2020-03-17 NOTE — Patient Instructions (Signed)
            [facility]         [address], [place] [address]         [phone]         Annia Friendly, PT, CLT   After Breast Cancer Class It is recommended you attend the ABC class to be educated on lymphedema risk reduction. This class is free of charge and lasts for 1 hour. It is a 1-time class.  You are scheduled for January 3rd at 11:00. We will send you a link.  Scar massage Begin gentle scar massage with coconut oil a few minutes each day to soften scar and reduce the appearance.   Home exercise Program You should resume the home exercises if your shoulder becomes tight during radiation.   Follow up PT: It is recommended you return every 3 months for the first 3 years following surgery to be assessed on the SOZO machine for an L-Dex score. This helps prevent clinically significant lymphedema in 95% of patients. These follow up screens are 15 minute appointments that you are not billed for. You are scheduled for February 28th at 8:30.

## 2020-03-17 NOTE — Therapy (Signed)
[place], [place], [address] Phone: [phone]   Fax:  [phone]  Physical Therapy Treatment  Patient Details  Name: Carolyn Cooper MRN: [id number] Date of Birth: October 18, 1955 Referring Provider (PT): Dr. Erroll Luna   Encounter Date: 03/17/2020   PT End of Session - 03/17/20 1052    Visit Number 2    Number of Visits 2    PT Start Time 1017    PT Stop Time 5102    PT Time Calculation (min) 38 min    Activity Tolerance Patient tolerated treatment well    Behavior During Therapy [facility] for tasks assessed/performed           Past Medical History:  Diagnosis Date   Allergy    seasonal   Anxiety    Barrett's esophagus    Breast cancer ([place]) 01/2020   left breast ILC   Cataract    had surgery   Depression    Family history of breast cancer    Family history of kidney cancer    Family history of ovarian cancer    Family history of stomach cancer    GERD (gastroesophageal reflux disease)    IBS (irritable bowel syndrome)    Migraines    Osteopenia 2019   T score -1.5 FRAX 8% / 0.7%    Past Surgical History:  Procedure Laterality Date   BREAST LUMPECTOMY WITH RADIOACTIVE SEED AND SENTINEL LYMPH NODE BIOPSY Left 02/14/2020   Procedure: LEFT BREAST LUMPECTOMY WITH RADIOACTIVE SEED AND SENTINEL LYMPH NODE [place];  Surgeon: Erroll Luna, MD;  Location: [facility];  Service: General;  Laterality: Left;  PECTORAL BLOCK   CATARACT EXTRACTION, BILATERAL  2018   [place]   COLONOSCOPY  07/21/2007   CYSTOSCOPY     MOUTH SURGERY  1978   WISDOM TEETH    There were no vitals filed for this visit.   Subjective Assessment - 03/17/20 1009    Subjective Patient reports she underwent a left lumpectomy and sentinel node biopsy (1 negative node) on 02/14/2020. Her Oncotype score was low but she has a consult for radiation on 03/27/2020. She  will undergo anti-estrogen therapy.    Pertinent History Patient was diagnosed on 01/02/2020 with left grade II invasive ductal carcinoma breast cancer. She underwent a left lumpectomy and sentinel node biopsy (1 negative node) on 02/14/2020. It is ER/PR positive and HER2 negative with a Ki67 of 10%.    Patient Stated Goals Make sure I'm back to baseline    Currently in Pain? No/denies              [facility] PT Assessment - 03/17/20 0001      Assessment   Medical Diagnosis s/p left lumpectomy and SLNB    Referring Provider (PT) Dr. Marcello Moores [place]    Onset Date/Surgical Date 02/14/20    Hand Dominance Left    Prior Therapy Baselines      Precautions   Precautions Other (comment)    Precaution Comments recent surgery      Restrictions   Weight Bearing Restrictions No      [place] residence    Living Arrangements Spouse/significant other    Available Help at Discharge Family      Prior Function   Level of [place] Retired    Leisure She is not exercising due to hip pain  Cognition   Overall Cognitive Status Within Functional Limits for tasks assessed      Observation/Other Assessments   Observations Incision in left upper outer quadrant is well healed with small scab present on medial aspect. no concerns for infection.      Posture/Postural Control   Posture/Postural Control Postural limitations    Postural Limitations Rounded Shoulders;Forward head      ROM / Strength   AROM / PROM / Strength AROM      AROM   AROM Assessment Site Shoulder    Right/Left Shoulder Left    Left Shoulder Extension 55 Degrees    Left Shoulder Flexion 144 Degrees    Left Shoulder ABduction 154 Degrees    Left Shoulder Internal Rotation 72 Degrees    Left Shoulder External Rotation 90 Degrees      Strength   Overall Strength Within functional limits for tasks performed             LYMPHEDEMA/ONCOLOGY QUESTIONNAIRE  - 03/17/20 0001      Type   Cancer Type Left breast cancer      Surgeries   Lumpectomy Date 02/14/20    Sentinel Lymph Node Biopsy Date 02/14/20    Number Lymph Nodes Removed 1      Treatment   Active Chemotherapy Treatment No    Past Chemotherapy Treatment No    Active Radiation Treatment No    Past Radiation Treatment No    Current Hormone Treatment No    Past Hormone Therapy No      What other symptoms do you have   Are you Having Heaviness or Tightness No    Are you having Pain No    Are you having pitting edema No    Is it Hard or Difficult finding clothes that fit No    Do you have infections No    Is there Decreased scar mobility No    [place] Sign No      Lymphedema Assessments   Lymphedema Assessments Upper extremities      Right Upper Extremity Lymphedema   10 cm Proximal to Olecranon Process 33.2 cm    Olecranon Process 26.6 cm    10 cm Proximal to Ulnar Styloid Process 23 cm    Just Proximal to Ulnar Styloid Process 16.3 cm    Across Hand at [employer] 18.3 cm    At [place] of 2nd Digit 6.5 cm      Left Upper Extremity Lymphedema   10 cm Proximal to Olecranon Process 34.8 cm    Olecranon Process 26.6 cm    10 cm Proximal to Ulnar Styloid Process 22.8 cm    Just Proximal to Ulnar Styloid Process 16.4 cm    Across Hand at [employer] 18.8 cm    At [place] of 2nd Digit 6.4 cm              Quick Dash - 03/17/20 0001    Open a tight or new jar No difficulty    Do heavy household chores (wash walls, wash floors) No difficulty    Carry a shopping bag or briefcase No difficulty    Wash your back No difficulty    Use a knife to cut food No difficulty    Recreational activities in which you take some force or impact through your arm, shoulder, or hand (golf, hammering, tennis) No difficulty    During the past week, to what extent has your arm, shoulder or hand problem  interfered with your normal social activities with family, friends, neighbors, or  groups? Not at all    During the past week, to what extent has your arm, shoulder or hand problem limited your work or other regular daily activities Not at all    Arm, shoulder, or hand pain. None    Tingling (pins and needles) in your arm, shoulder, or hand None    Difficulty Sleeping No difficulty    DASH Score 0 %                          PT Education - 03/17/20 1052    Education Details Follow up care, PT for hip. scar massage, HEP    Person(s) Educated Patient    Methods Explanation;Handout;Demonstration    Comprehension Verbalized understanding;Returned demonstration               PT Long Term Goals - 03/17/20 1053      PT LONG TERM GOAL #1   Title Patient will demonstrate she has regained full shoulder ROM and function post operatively compared to baselines.    Time 8    Period Weeks    Status Achieved                 Plan - 03/17/20 1049    Clinical Impression Statement Patient is doing very ell s/p left lumpectomy and sentinel node biopsy on 02/14/2020 with 1 negative node removed. She has a consult for radiation on 03/27/2020 and her Oncotype score was low so no chemotherapy is needed. She has regained full shoulder ROM and function, has no signs of lymphedema, and her incision is healing well. She will benefit from the After Breast Cancer class for lymphedema risk reduction education but otherwise has no PT needs related to her breast cancer. She reports she has been diagnosed with bilateral hip bursitis and would benefit from PT to address this as it prevents her from exercising. We discussed this as it is critical to reducing her recurrence rate that she be able ot exercise and she wa sopen to that idea. She would like to go closer to home at our [place] clinic. A message was sent to the nurse navigator requesting a referral.    PT Treatment/Interventions ADLs/Self Care Home Management;Therapeutic exercise;Patient/family education    PT Next  Visit Plan D/C    PT Home Exercise Plan Post op shoulder ROM HEP    Consulted and Agree with Plan of Care Patient           Patient will benefit from skilled therapeutic intervention in order to improve the following deficits and impairments:  Postural dysfunction,Decreased range of motion,Impaired UE functional use,Pain,Decreased knowledge of precautions,Decreased scar mobility  Visit Diagnosis: Malignant neoplasm of upper-outer quadrant of left breast in female, estrogen receptor positive ([place])  Abnormal posture  Aftercare following surgery for neoplasm     Problem List Patient Active Problem List   Diagnosis Date Noted   Genetic testing 02/13/2020   Family history of ovarian cancer    Family history of breast cancer    Family history of stomach cancer    Family history of kidney cancer    Malignant neoplasm of upper-outer quadrant of left breast in female, estrogen receptor positive ([place]) 01/30/2020   Bursitis of both hips 08/01/2018   Anxiety 10/17/2017   Weight gain 10/17/2017   Microscopic hematuria 09/13/2015   Vitamin D deficiency 09/11/2015   Knee MCL sprain 04/04/2015  Nonallopathic lesion of sacral region 01/10/2015   Nonallopathic lesion of lumbosacral region 01/10/2015   Nonallopathic lesion of thoracic region 01/10/2015   SI (sacroiliac) joint dysfunction 12/19/2014   Osteopenia 01/30/2014   Migraine headache 01/30/2014   PHYSICAL THERAPY DISCHARGE SUMMARY  Visits from Start of Care: 2  Current functional level related to goals / functional outcomes: See above for objective findings.   Remaining deficits: None   Education / Equipment: HEP and lymphedema education Plan:                                                    Patient goals were met. Patient is being discharged due to meeting the stated rehab goals.  ?????         Annia Friendly, [place] 03/17/20 10:55 AM  [place], [place], [address] Phone: [phone]   Fax:  [phone]  Name: Kyani Simkin MRN: [id number] Date of Birth: 07/27/1955

## 2020-03-18 ENCOUNTER — Encounter: Payer: Self-pay | Admitting: *Deleted

## 2020-03-18 ENCOUNTER — Encounter (HOSPITAL_COMMUNITY): Payer: Self-pay | Admitting: Oncology

## 2020-03-19 ENCOUNTER — Telehealth: Payer: Self-pay | Admitting: Pharmacist

## 2020-03-19 DIAGNOSIS — G43709 Chronic migraine without aura, not intractable, without status migrainosus: Secondary | ICD-10-CM

## 2020-03-19 MED ORDER — NURTEC 75 MG PO TBDP: 1.0000 | ORAL_TABLET | ORAL | 11 refills | Status: DC

## 2020-03-19 NOTE — Telephone Encounter (Signed)
Left detailed message on patient's voice mail.

## 2020-03-19 NOTE — Telephone Encounter (Signed)
Please let patient know:  Nurtec prescription (for migraines) sent to [facility]--this will allow patient to get correct quantity of Nurtec and at $0-10 copay This is mail order, but definitely the way to go as we will not always have samples.  Jan did leave samples for her up front in the mean time  Thanks! Almyra Free

## 2020-03-24 ENCOUNTER — Ambulatory Visit: Payer: 59 | Admitting: Physical Therapy

## 2020-03-24 ENCOUNTER — Other Ambulatory Visit: Payer: Self-pay | Admitting: Family Medicine

## 2020-03-24 ENCOUNTER — Other Ambulatory Visit: Payer: Self-pay

## 2020-03-24 ENCOUNTER — Encounter: Payer: Self-pay | Admitting: Physical Therapy

## 2020-03-24 ENCOUNTER — Ambulatory Visit: Payer: 59 | Attending: Oncology | Admitting: Physical Therapy

## 2020-03-24 DIAGNOSIS — G43809 Other migraine, not intractable, without status migrainosus: Secondary | ICD-10-CM

## 2020-03-24 DIAGNOSIS — M25551 Pain in right hip: Secondary | ICD-10-CM | POA: Diagnosis present

## 2020-03-24 DIAGNOSIS — M25552 Pain in left hip: Secondary | ICD-10-CM | POA: Diagnosis present

## 2020-03-24 NOTE — Therapy (Signed)
[facility] [place], [place], [address] Phone: [phone]   Fax:  [phone]  Physical Therapy Evaluation  Patient Details  Name: Carolyn Cooper MRN: [id number] Date of Birth: [birth year] Referring Provider (PT): Lurline Del   Encounter Date: 03/24/2020   PT End of Session - 03/24/20 1552    Visit Number 1    Number of Visits 8    Date for PT Re-Evaluation 04/21/20    PT Start Time 0233    PT Stop Time 0300    PT Time Calculation (min) 27 min    Activity Tolerance Patient tolerated treatment well    Behavior During Therapy [facility] for tasks assessed/performed           Past Medical History:  Diagnosis Date   Allergy    seasonal   Anxiety    Barrett's esophagus    Breast cancer ([place]) 01/2020   left breast ILC   Cataract    had surgery   Depression    Family history of breast cancer    Family history of kidney cancer    Family history of ovarian cancer    Family history of stomach cancer    GERD (gastroesophageal reflux disease)    IBS (irritable bowel syndrome)    Migraines    Osteopenia 2019   T score -1.5 FRAX 8% / 0.7%    Past Surgical History:  Procedure Laterality Date   BREAST LUMPECTOMY WITH RADIOACTIVE SEED AND SENTINEL LYMPH NODE BIOPSY Left 02/14/2020   Procedure: LEFT BREAST LUMPECTOMY WITH RADIOACTIVE SEED AND SENTINEL LYMPH NODE [place];  Surgeon: Erroll Luna, MD;  Location: [facility];  Service: General;  Laterality: Left;  PECTORAL BLOCK   CATARACT EXTRACTION, BILATERAL  2018   CERVICAL [place] BIOPSY  1996   COLONOSCOPY  07/21/2007   CYSTOSCOPY     MOUTH SURGERY  1978   WISDOM TEETH    There were no vitals filed for this visit.    Subjective Assessment - 03/24/20 1553    Subjective COVID-19 screen performed prior to patient entering clinic. The patient presents to the clinic today with c/o bilateral hip pain that has ben ongoing for about  two years.  Her pain-level today is a 4/10.  Her sleep is disturbed due to pain and has to switch sides.  Recommended she try sleeping with a pillow between her knees.  She states she feels a burning pain over both lateral hip region.  Massage have helped her feel better.  Walking increase her pain.    Pertinent History Patient was diagnosed on 01/02/2020 with left grade II invasive ductal carcinoma breast cancer. She underwent a left lumpectomy and sentinel node biopsy (1 negative node) on 02/14/2020. It is ER/PR positive and HER2 negative with a Ki67 of 10%. Radiation consult scheduled for 03/27/20.  H/o SIJ dysfucntion, osteopenia.    How long can you walk comfortably? Short community distances.    Patient Stated Goals To be able to walk for exercise.    Currently in Pain? Yes    Pain Score 4     Pain Location --   Both hips.   Pain Orientation Right;Left    Pain Descriptors / Indicators Burning;Sore    Pain Type Chronic pain    Pain Onset More than a month ago    Pain Frequency Constant    Aggravating Factors  See above.    Pain Relieving Factors See above.  [facility] PT Assessment - 03/24/20 0001      Assessment   Medical Diagnosis Bilateral hip pain.    Referring Provider (PT) Sarajane Jews Magrinat    Onset Date/Surgical Date --   ~2 years.     Precautions   Precaution Comments No ultrasound or electrical stimulation at this time.      Restrictions   Weight Bearing Restrictions No      Home Environment   Living Environment Private residence      Prior Function   Level of Independence Independent      Posture/Postural Control   Posture/Postural Control Postural limitations    Postural Limitations Rounded Shoulders;Forward head      Deep Tendon Reflexes   DTR Assessment Site Patella;Achilles    Patella DTR 2+    Achilles DTR 2+      AROM   Overall AROM Comments Bilateral hip range of motion is essentially WNL though her left hip adduction is slightly less than  right.      Strength   Overall Strength Comments Normal hip strength.      Palpation   Palpation comment Tender to palption over bilateral TFL's, glut meds and posterior to bilateral greater trochanters.      Special Tests   Other special tests Equal lelg lengths.      Ambulation/Gait   Gait Comments WNL.                      Objective measurements completed on examination: See above findings.                    PT Long Term Goals - 03/24/20 1612      PT LONG TERM GOAL #1   Title Independent with a HEP.    Time 4    Period Weeks    Status New      PT LONG TERM GOAL #2   Title Walk a community distance with bilateral hip pain-level not > 2-3/10.    Time 4    Period Weeks    Status New      PT LONG TERM GOAL #3   Title Sleep undisturbed 6 hours.    Time 4    Period Weeks    Status New                  Plan - 03/24/20 1605    Clinical Impression Statement The patient presenst to OPPT with c/o bilateral hip pain that has beed ongoing for about two years.  Her pain and burning into both lateral hips increases the more she walks.  Her sleep is also disturbed due to pain.  Her bilateral hip range of motion is essentially WNL though her left hip adduction is slightly less than right.  Her bilateral hip strength is normal.  She is tender to palption over bilateral TFL's, glut meds and posterior to both greater trochanters.  She would like to decrease her pain so she can walk for exercise.  She is scheduled for a radiation consult on 03/27/20 following a breast lumpectomy.  Patient will benefit from skilled physical therapy intervention to address deficits and pain.    Personal Factors and Comorbidities Comorbidity 1;Comorbidity 2    Comorbidities Patient was diagnosed on 01/02/2020 with left grade II invasive ductal carcinoma breast cancer. She underwent a left lumpectomy and sentinel node biopsy (1 negative node) on 02/14/2020. It is ER/PR  positive and HER2 negative with a Ki67 of  10%. Radiation consult scheduled for 03/27/20.  H/o SIJ dysfucntion, osteopenia.    Examination-Activity Limitations Other;Sleep    Examination-Participation Restrictions Other    Stability/Clinical Decision Making Evolving/Moderate complexity    Clinical Decision Making Low    Rehab Potential Excellent    PT Frequency 2x / week    PT Duration 4 weeks    PT Treatment/Interventions Moist Heat;Cryotherapy;Therapeutic activities;Therapeutic exercise;Manual techniques;Passive range of motion    PT Next Visit Plan Nustep with progression to treadmill.  STW/M to bilateral hip musculature.  Lateral hip musculature stretching.  SDLY hip abduction.    Consulted and Agree with Plan of Care Patient           Patient will benefit from skilled therapeutic intervention in order to improve the following deficits and impairments:  Pain,Decreased activity tolerance,Decreased range of motion,Increased muscle spasms  Visit Diagnosis: Pain in right hip - Plan: PT plan of care cert/re-cert  Pain in left hip - Plan: PT plan of care cert/re-cert     Problem List Patient Active Problem List   Diagnosis Date Noted   Genetic testing 02/13/2020   Family history of ovarian cancer    Family history of breast cancer    Family history of stomach cancer    Family history of kidney cancer    Malignant neoplasm of upper-outer quadrant of left breast in female, estrogen receptor positive ([place]) 01/30/2020   Bursitis of both hips 08/01/2018   Anxiety 10/17/2017   Weight gain 10/17/2017   Microscopic hematuria 09/13/2015   Vitamin D deficiency 09/11/2015   Knee MCL sprain 04/04/2015   Nonallopathic lesion of sacral region 01/10/2015   Nonallopathic lesion of lumbosacral region 01/10/2015   Nonallopathic lesion of thoracic region 01/10/2015   SI (sacroiliac) joint dysfunction 12/19/2014   Osteopenia 01/30/2014   Migraine headache 01/30/2014     Estanislado Surgeon, [place] MPT 03/24/2020, [facility] [address] [place], [place], [address] Phone: [phone]   Fax:  [phone]  Name: Carolyn Cooper MRN: [id number] Date of Birth: [birth year]

## 2020-03-25 ENCOUNTER — Encounter: Payer: Self-pay | Admitting: Family Medicine

## 2020-03-25 ENCOUNTER — Encounter: Payer: Self-pay | Admitting: Oncology

## 2020-03-25 ENCOUNTER — Other Ambulatory Visit: Payer: Self-pay | Admitting: Family Medicine

## 2020-03-25 DIAGNOSIS — G43809 Other migraine, not intractable, without status migrainosus: Secondary | ICD-10-CM

## 2020-03-25 MED ORDER — SUMATRIPTAN SUCCINATE 100 MG PO TABS: ORAL_TABLET | ORAL | 99 refills | Status: DC

## 2020-03-26 ENCOUNTER — Encounter: Payer: 59 | Admitting: Physical Therapy

## 2020-03-27 ENCOUNTER — Ambulatory Visit
Admission: RE | Admit: 2020-03-27 | Discharge: 2020-03-27 | Disposition: A | Discharge status: Home / Self Care | Payer: 59 | Source: Ambulatory Visit | Attending: Radiation Oncology | Admitting: Radiation Oncology

## 2020-03-27 ENCOUNTER — Other Ambulatory Visit: Payer: Self-pay

## 2020-03-27 VITALS — BP 134/83 | HR 75 | Temp 97.3°F | Resp 18 | Ht 65.5 in | Wt 180.1 lb

## 2020-03-27 DIAGNOSIS — K589 Irritable bowel syndrome without diarrhea: Secondary | ICD-10-CM | POA: Diagnosis not present

## 2020-03-27 DIAGNOSIS — Z791 Long term (current) use of non-steroidal anti-inflammatories (NSAID): Secondary | ICD-10-CM | POA: Insufficient documentation

## 2020-03-27 DIAGNOSIS — C50412 Malignant neoplasm of upper-outer quadrant of left female breast: Secondary | ICD-10-CM | POA: Insufficient documentation

## 2020-03-27 DIAGNOSIS — F418 Other specified anxiety disorders: Secondary | ICD-10-CM | POA: Diagnosis not present

## 2020-03-27 DIAGNOSIS — Z8051 Family history of malignant neoplasm of kidney: Secondary | ICD-10-CM | POA: Diagnosis not present

## 2020-03-27 DIAGNOSIS — K219 Gastro-esophageal reflux disease without esophagitis: Secondary | ICD-10-CM | POA: Diagnosis not present

## 2020-03-27 DIAGNOSIS — Z79899 Other long term (current) drug therapy: Secondary | ICD-10-CM | POA: Diagnosis not present

## 2020-03-27 DIAGNOSIS — M8580 Other specified disorders of bone density and structure, unspecified site: Secondary | ICD-10-CM | POA: Diagnosis not present

## 2020-03-27 DIAGNOSIS — Z803 Family history of malignant neoplasm of breast: Secondary | ICD-10-CM | POA: Diagnosis not present

## 2020-03-27 DIAGNOSIS — Z170 Estrogen receptor positive status [ER+]: Secondary | ICD-10-CM | POA: Insufficient documentation

## 2020-03-27 DIAGNOSIS — K2270 Barrett's esophagus without dysplasia: Secondary | ICD-10-CM | POA: Diagnosis not present

## 2020-03-27 NOTE — Addendum Note (Signed)
Encounter addended by: Cori Razor, RN on: 03/27/2020 1:26 PM  Actions taken: Charge Capture section accepted

## 2020-03-27 NOTE — Progress Notes (Signed)
Radiation Oncology         [phone] ________________________________  Name: Carolyn Cooper        MRN: [id number]  Date of Service: 03/27/2020 DOB: Dec 09, 1955  [id number], Koleen Distance, DO  Magrinat, Virgie Dad, MD     REFERRING PHYSICIAN: Magrinat, Virgie Dad, MD   DIAGNOSIS: The encounter diagnosis was Malignant neoplasm of upper-outer quadrant of left breast in female, estrogen receptor positive ([place]).   HISTORY OF PRESENT ILLNESS: Carolyn Cooper is a 64 y.o. female originally seen in the multidisciplinary breast clinic for a new diagnosis of left breast cancer. The patient was noted to have a screening detected mass in the left breast. She underwent diagnostic imaging which revealed a mass in the posterior upper outer quadrant at 2:30 position measuring 3 mm in maximum dimension. Her axilla was negative for adenopathy. She underwent a biopsy of this lesion on 01/28/20 that revealed an invasive lobular carcinoma grade 2, with associated LCIS. Her tumor was ER/PR positive, HER 2 negative with a Ki 67 of 10%.  She subsequently underwent left lumpectomy with sentinel lymph node biopsy on 02/14/2020, and final pathology revealed a 1.2 cm invasive lobular carcinoma, grade 2, and her margins were negative though the closest was 1 mm from the anterior margin.  Additional anterior margin excision was negative for carcinoma, and a single lymph node in the left axilla was negative for adenopathy.  Her cancer was also tested for Oncotype DX score which was 16.  She will not receive systemic therapy, and genetic testing was negative. She is seen today to discuss adjuvant radiotherapy to the left breast.     PREVIOUS RADIATION THERAPY: No   PAST MEDICAL HISTORY:  Past Medical History:  Diagnosis Date  . Allergy    seasonal  . Anxiety   . Barrett's esophagus   . Breast cancer ([place]) 01/2020   left breast ILC  . Cataract    had surgery  . Depression   . Family history of breast cancer    . Family history of kidney cancer   . Family history of ovarian cancer   . Family history of stomach cancer   . GERD (gastroesophageal reflux disease)   . IBS (irritable bowel syndrome)   . Migraines   . Osteopenia 2019   T score -1.5 FRAX 8% / 0.7%       PAST SURGICAL HISTORY: Past Surgical History:  Procedure Laterality Date  . BREAST LUMPECTOMY WITH RADIOACTIVE SEED AND SENTINEL LYMPH NODE BIOPSY Left 02/14/2020   Procedure: LEFT BREAST LUMPECTOMY WITH RADIOACTIVE SEED AND SENTINEL LYMPH NODE MAPPING;  Surgeon: Erroll Luna, MD;  Location: [facility];  Service: General;  Laterality: Left;  PECTORAL BLOCK  . CATARACT EXTRACTION, BILATERAL  2018  . CERVICAL CONE BIOPSY  1996  . COLONOSCOPY  07/21/2007  . CYSTOSCOPY    . MOUTH SURGERY  1978   WISDOM TEETH     FAMILY HISTORY:  Family History  Problem Relation Age of Onset  . Ovarian cancer Maternal Grandmother 60  . Diverticulitis Mother   . Heart disease Father   . Kidney cancer Father 55  . Stomach cancer Maternal Grandfather        dx 60s  . Breast cancer Other        dx >50, maternal great-aunt  . Breast cancer Other        dx >50, maternal great-aunt  . Breast cancer Other        dx >  50, maternal great-aunt  . Colon cancer Neg Hx   . Esophageal cancer Neg Hx   . Rectal cancer Neg Hx      SOCIAL HISTORY:  reports that she has never smoked. She has never used smokeless tobacco. She reports previous alcohol use. She reports that she does not use drugs. The patient is married and lives in [place]. 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 She has adult children and young grandchildren.   ALLERGIES: Sulfa antibiotics   MEDICATIONS:  Current Outpatient Medications  Medication Sig Dispense Refill  . calcium carbonate (OS-CAL) 600 MG TABS Take 600 mg by mouth 2 (two) times daily with a meal.    . Cholecalciferol (VITAMIN D) 400 UNITS capsule Take 800  Units by mouth daily.      Marland Kitchen escitalopram (LEXAPRO) 20 MG tablet Take 1 tablet (20 mg total) by mouth daily. 90 tablet 3  . HYDROcodone-acetaminophen (NORCO/VICODIN) 5-325 MG tablet Take 1 tablet by mouth every 6 (six) hours as needed for moderate pain. 15 tablet 0  . hyoscyamine (LEVSIN SL) 0.125 MG SL tablet TAKE I CAPSULE BY MOUTH DAILY AS NEEDED 30 tablet 1  . ibuprofen (ADVIL) 800 MG tablet Take 1 tablet (800 mg total) by mouth every 8 (eight) hours as needed. 30 tablet 0  . meloxicam (MOBIC) 15 MG tablet Take 1 tablet (15 mg total) by mouth daily as needed for pain. 90 tablet 1  . Nutritional Supplements (IMMUNE ENHANCE PO) Take by mouth.    Marland Kitchen omeprazole (PRILOSEC) 40 MG capsule TAKE 1 CAPSULE BY MOUTH EVERY DAY 90 capsule 1  . Rimegepant Sulfate (NURTEC) 75 MG TBDP Take 1 tablet by mouth every other day. 16 tablet 11  . SUMAtriptan (IMITREX) 100 MG tablet May repeat in 2 hours if headache persists or recurs. 10 tablet prn  . vitamin C (ASCORBIC ACID) 250 MG tablet Take 500 mg by mouth daily.     No current facility-administered medications for this encounter.     REVIEW OF SYSTEMS: On review of systems, the patient reports that she is doing well overall. She denies any concerns with her healing course since surgery. She has questions about different types of radiotherapy. No complaints are otherwise verbalized.    PHYSICAL EXAM:  Wt Readings from Last 3 Encounters:  03/27/20 180 lb 2 oz (81.7 kg)  02/14/20 176 lb 12.9 oz (80.2 kg)  02/06/20 180 lb 1.6 oz (81.7 kg)   Temp Readings from Last 3 Encounters:  03/27/20 (!) 97.3 F (36.3 C) (Temporal)  02/14/20 97.7 F (36.5 C)  02/06/20 98.1 F (36.7 C) (Tympanic)   BP Readings from Last 3 Encounters:  03/27/20 134/83  02/14/20 124/66  02/06/20 123/62   Pulse Readings from Last 3 Encounters:  03/27/20 75  02/14/20 79  02/06/20 68    In general this is a well appearing caucasian female in no acute distress. She's alert and  oriented x4 and appropriate throughout the examination. Cardiopulmonary assessment is negative for acute distress and she exhibits normal effort. Bilateral breast exam is deferred.    ECOG = 0  0 - Asymptomatic (Fully active, able to carry on all predisease activities without restriction)  1 - Symptomatic but completely ambulatory (Restricted in physically strenuous activity but ambulatory and able to carry out work of a light or sedentary nature. For example, light housework, office work)  2 - Symptomatic, <50% in bed during the day (Ambulatory and capable of  all self care but unable to carry out any work activities. Up and about more than 50% of waking hours)  3 - Symptomatic, >50% in bed, but not bedbound (Capable of only limited self-care, confined to bed or chair 50% or more of waking hours)  4 - Bedbound (Completely disabled. Cannot carry on any self-care. Totally confined to bed or chair)  5 - Death   Eustace Pen MM, Creech RH, Tormey DC, et al. [phone]). "Toxicity and response criteria of the [facility]". [place] Oncol. 5 (6): 649-55    LABORATORY DATA:  Lab Results  Component Value Date   WBC 5.9 02/06/2020   HGB 13.0 02/06/2020   HCT 39.1 02/06/2020   MCV 97.0 02/06/2020   PLT 241 02/06/2020   Lab Results  Component Value Date   NA 142 02/06/2020   K 3.8 02/06/2020   CL 107 02/06/2020   CO2 27 02/06/2020   Lab Results  Component Value Date   ALT 21 02/06/2020   AST 18 02/06/2020   ALKPHOS 113 02/06/2020   BILITOT 0.5 02/06/2020      RADIOGRAPHY: No results found.     IMPRESSION/PLAN: 1. Stage IA, [id number] grade 2, ER/PR positive invasive lobular carcinoma of the left breast. Dr. Lisbeth Renshaw has reviewed her pathology and course since surgery and recommends adjuvant external radiotherapy to the breast followed by antiestrogen therapy. We discussed the risks, benefits, short, and long term effects of radiotherapy, and the patient is interested  in proceeding. We also discussed different approaches to radiotherapy and she is comfortable moving forward with whole breast radiation. I also reviewed the delivery and logistics of radiotherapy and Dr. Lisbeth Renshaw recommends 4 weeks of radiotherapy to the left breast with deep inspiration breath hold technique. She will simulate this afternoon. Written consent is obtained and placed in the chart, a copy was provided to the patient.   In a visit lasting 45 minutes, greater than 50% of the time was spent face to face reviewing her case, as well as in preparation of, discussing, and coordinating the patient's care.    Carola Rhine, PAC

## 2020-03-31 ENCOUNTER — Encounter: Payer: Self-pay | Admitting: *Deleted

## 2020-04-02 ENCOUNTER — Other Ambulatory Visit: Payer: Self-pay

## 2020-04-02 ENCOUNTER — Encounter: Payer: Self-pay | Admitting: Physical Therapy

## 2020-04-02 ENCOUNTER — Ambulatory Visit: Payer: 59 | Admitting: Physical Therapy

## 2020-04-02 DIAGNOSIS — M25551 Pain in right hip: Secondary | ICD-10-CM

## 2020-04-02 DIAGNOSIS — C50412 Malignant neoplasm of upper-outer quadrant of left female breast: Secondary | ICD-10-CM | POA: Diagnosis not present

## 2020-04-02 DIAGNOSIS — M25552 Pain in left hip: Secondary | ICD-10-CM

## 2020-04-02 NOTE — Therapy (Signed)
[facility] [address] [place], [place], [address] Phone: [phone]   Fax:  [phone]  Physical Therapy Treatment  Patient Details  Name: Carolyn Cooper MRN: [id number] Date of Birth: 04/02/1956 Referring Provider (PT): Gustav Magrinat   Encounter Date: 04/02/2020   PT End of Session - 04/02/20 1419    Visit Number 2    Number of Visits 8    Date for PT Re-Evaluation 04/21/20    PT Start Time 1346    PT Stop Time 1435    PT Time Calculation (min) 49 min    Activity Tolerance Patient tolerated treatment well    Behavior During Therapy WFL for tasks assessed/performed           Past Medical History:  Diagnosis Date  . Allergy    seasonal  . Anxiety   . Barrett's esophagus   . Breast cancer ([facility]) 01/2020   left breast ILC  . Cataract    had surgery  . Depression   . Family history of breast cancer   . Family history of kidney cancer   . Family history of ovarian cancer   . Family history of stomach cancer   . GERD (gastroesophageal reflux disease)   . IBS (irritable bowel syndrome)   . Migraines   . Osteopenia 2019   T score -1.5 FRAX 8% / 0.7%    Past Surgical History:  Procedure Laterality Date  . BREAST LUMPECTOMY WITH RADIOACTIVE SEED AND SENTINEL LYMPH NODE BIOPSY Left 02/14/2020   Procedure: LEFT BREAST LUMPECTOMY WITH RADIOACTIVE SEED AND SENTINEL LYMPH NODE MAPPING;  Surgeon: Cornett, Thomas, MD;  Location: [facility];  Service: General;  Laterality: Left;  PECTORAL BLOCK  . CATARACT EXTRACTION, BILATERAL  2018  . CERVICAL CONE BIOPSY  1996  . COLONOSCOPY  07/21/2007  . CYSTOSCOPY    . MOUTH SURGERY  1978   WISDOM TEETH    There were no vitals filed for this visit.   Subjective Assessment - 04/02/20 1347    Subjective COVID 19 screening performed on patient upon arrival. Patient reports soreness if L hip is palpable but L hip is also more tight.    Pertinent History Patient was  diagnosed on 01/02/2020 with left grade II invasive ductal carcinoma breast cancer. She underwent a left lumpectomy and sentinel node biopsy (1 negative node) on 02/14/2020. It is ER/PR positive and HER2 negative with a Ki67 of 10%. Radiation consult scheduled for 03/27/20.  H/o SIJ dysfucntion, osteopenia.    How long can you walk comfortably? Short community distances.    Patient Stated Goals To be able to walk for exercise.    Currently in Pain? No/denies              [facility] PT Assessment - 04/02/20 0001      Assessment   Medical Diagnosis Bilateral hip pain.    Referring Provider (PT) Gustav Magrinat    Onset Date/Surgical Date 02/14/20    Hand Dominance Left    Prior Therapy Baselines      Precautions   Precautions Other (comment)    Precaution Comments No ultrasound or electrical stimulation at this time.      Restrictions   Weight Bearing Restrictions No                         [facility] Adult PT Treatment/Exercise - 04/02/20 0001      Exercises   Exercises Knee/Hip        Knee/Hip Exercises: Stretches   ITB Stretch Both;3 reps;60 seconds      Knee/Hip Exercises: Aerobic   Nustep L2 x15 min      Knee/Hip Exercises: Sidelying   Hip ABduction Strengthening;Both;20 reps    Clams B hip clam x20 reps each      Manual Therapy   Manual Therapy Soft tissue mobilization    Soft tissue mobilization B ITB to reduce TPs and tightness                       PT Long Term Goals - 04/02/20 1453      PT LONG TERM GOAL #1   Title Independent with a HEP.    Time 4    Period Weeks    Status Achieved      PT LONG TERM GOAL #2   Title Walk a community distance with bilateral hip pain-level not > 2-3/10.    Time 4    Period Weeks    Status On-going      PT LONG TERM GOAL #3   Title Sleep undisturbed 6 hours.    Time 4    Period Weeks    Status On-going                 Plan - 04/02/20 1444    Clinical Impression Statement Patient  presented in clinic with reports of no current pain although palpably sore in L hip. Patient reported greatest discomfort with R hip abduction/clam in SL. Greater tightness also noted by patient with R ITB stretch. Patient reports her daughter giving her a percussion massage gun for Christmas and she has used on L ITB only at this time. Patient had inquired regarding home TENS unit which she was educated could not be used during PT due to active cancer but to talk with her oncologist. TPs noted throughout R ITB and less in L ITB. Patient indicated soreness with TPR in R ITB. Patient encouraged to use percussion massage gun bilaterally and complete HEP.    Personal Factors and Comorbidities Comorbidity 1;Comorbidity 2    Comorbidities Patient was diagnosed on 01/02/2020 with left grade II invasive ductal carcinoma breast cancer. She underwent a left lumpectomy and sentinel node biopsy (1 negative node) on 02/14/2020. It is ER/PR positive and HER2 negative with a Ki67 of 10%. Radiation consult scheduled for 03/27/20.  H/o SIJ dysfucntion, osteopenia.    Examination-Activity Limitations Other;Sleep    Examination-Participation Restrictions Other    Stability/Clinical Decision Making Evolving/Moderate complexity    Rehab Potential Excellent    PT Frequency 2x / week    PT Duration 4 weeks    PT Treatment/Interventions Moist Heat;Cryotherapy;Therapeutic activities;Therapeutic exercise;Manual techniques;Passive range of motion    PT Next Visit Plan Nustep with progression to treadmill.  STW/M to bilateral hip musculature.  Lateral hip musculature stretching.  SDLY hip abduction.    PT Home Exercise Plan Post op shoulder ROM HEP    Consulted and Agree with Plan of Care Patient           Patient will benefit from skilled therapeutic intervention in order to improve the following deficits and impairments:  Pain,Decreased activity tolerance,Decreased range of motion,Increased muscle spasms  Visit  Diagnosis: Pain in right hip  Pain in left hip     Problem List Patient Active Problem List   Diagnosis Date Noted  . Genetic testing 02/13/2020  . Family history of ovarian cancer   . Family history of breast cancer   .   Family history of stomach cancer   . Family history of kidney cancer   . Malignant neoplasm of upper-outer quadrant of left breast in female, estrogen receptor positive ([place]) 01/30/2020  . Bursitis of both hips 08/01/2018  . Anxiety 10/17/2017  . Weight gain 10/17/2017  . Microscopic hematuria 09/13/2015  . Vitamin D deficiency 09/11/2015  . Knee MCL sprain 04/04/2015  . Nonallopathic lesion of sacral region 01/10/2015  . Nonallopathic lesion of lumbosacral region 01/10/2015  . Nonallopathic lesion of thoracic region 01/10/2015  . SI (sacroiliac) joint dysfunction 12/19/2014  . Osteopenia 01/30/2014  . Migraine headache 01/30/2014    Standley Brooking, PTA 04/02/2020, 2:53 PM  [facility] [address] [place], [place], [address] Phone: [phone]   Fax:  [phone]  Name: Carolyn Cooper MRN: [id number] Date of Birth: 04/02/1956

## 2020-04-04 ENCOUNTER — Other Ambulatory Visit: Payer: Self-pay | Admitting: Family Medicine

## 2020-04-07 NOTE — Telephone Encounter (Signed)
Pt takes Imitrex.  Please call pharmacy to have them dc this rx.

## 2020-04-08 ENCOUNTER — Other Ambulatory Visit: Payer: Self-pay

## 2020-04-08 ENCOUNTER — Telehealth: Payer: Self-pay | Admitting: Radiation Oncology

## 2020-04-08 ENCOUNTER — Encounter: Payer: Self-pay | Admitting: Radiation Oncology

## 2020-04-08 ENCOUNTER — Ambulatory Visit
Admission: RE | Admit: 2020-04-08 | Discharge: 2020-04-08 | Disposition: A | Discharge status: Home / Self Care | Payer: 59 | Source: Ambulatory Visit | Attending: Radiation Oncology | Admitting: Radiation Oncology

## 2020-04-08 DIAGNOSIS — C50412 Malignant neoplasm of upper-outer quadrant of left female breast: Secondary | ICD-10-CM | POA: Diagnosis present

## 2020-04-08 DIAGNOSIS — Z803 Family history of malignant neoplasm of breast: Secondary | ICD-10-CM | POA: Insufficient documentation

## 2020-04-08 DIAGNOSIS — Z170 Estrogen receptor positive status [ER+]: Secondary | ICD-10-CM | POA: Insufficient documentation

## 2020-04-08 NOTE — Telephone Encounter (Signed)
Carolyn Cooper is under treatment with radiation for breast cancer. We were notifed that her husband is positive for covid and recommended she not come in for her radiation at her scheduled, time, but that we would have to treat her at the end of the day if she is positive as well. Our plan is for her to get rapid testing tonight or tomorrow and then come in the late afternoon for treatment. She will notify us of her test results when available so we can make plans to proceed.

## 2020-04-09 ENCOUNTER — Other Ambulatory Visit: Payer: Self-pay

## 2020-04-09 ENCOUNTER — Ambulatory Visit
Admission: RE | Admit: 2020-04-09 | Discharge: 2020-04-09 | Disposition: A | Discharge status: Home / Self Care | Payer: 59 | Source: Ambulatory Visit | Attending: Radiation Oncology | Admitting: Radiation Oncology

## 2020-04-09 DIAGNOSIS — C50412 Malignant neoplasm of upper-outer quadrant of left female breast: Secondary | ICD-10-CM | POA: Diagnosis not present

## 2020-04-10 ENCOUNTER — Other Ambulatory Visit: Payer: Self-pay

## 2020-04-10 ENCOUNTER — Ambulatory Visit: Payer: 59 | Admitting: Physical Therapy

## 2020-04-10 ENCOUNTER — Ambulatory Visit
Admission: RE | Admit: 2020-04-10 | Discharge: 2020-04-10 | Disposition: A | Discharge status: Home / Self Care | Payer: 59 | Source: Ambulatory Visit | Attending: Radiation Oncology | Admitting: Radiation Oncology

## 2020-04-10 DIAGNOSIS — C50412 Malignant neoplasm of upper-outer quadrant of left female breast: Secondary | ICD-10-CM | POA: Diagnosis not present

## 2020-04-10 NOTE — Telephone Encounter (Signed)
TC to [facility] to DC [employer]

## 2020-04-11 ENCOUNTER — Ambulatory Visit
Admission: RE | Admit: 2020-04-11 | Discharge: 2020-04-11 | Disposition: A | Discharge status: Home / Self Care | Payer: 59 | Source: Ambulatory Visit | Attending: Radiation Oncology | Admitting: Radiation Oncology

## 2020-04-11 DIAGNOSIS — C50412 Malignant neoplasm of upper-outer quadrant of left female breast: Secondary | ICD-10-CM | POA: Diagnosis not present

## 2020-04-11 DIAGNOSIS — Z170 Estrogen receptor positive status [ER+]: Secondary | ICD-10-CM

## 2020-04-11 MED ORDER — ALRA NON-METALLIC DEODORANT (RAD-ONC)
1.0000 "application " | Freq: Once | TOPICAL | Status: AC
  Administered 2020-04-11: 1 via TOPICAL

## 2020-04-11 MED ORDER — RADIAPLEXRX EX GEL: Freq: Once | CUTANEOUS | Status: AC

## 2020-04-11 NOTE — Progress Notes (Signed)
Pt here for patient teaching.  Pt given Radiation and You booklet, skin care instructions, Alra deodorant and Radiaplex gel.  Reviewed areas of pertinence such as fatigue, hair loss, skin changes, blurry vision and breast tenderness . Pt able to give teach back of to pat skin and use unscented/gentle soap,apply Radiaplex bid, avoid applying anything to skin within 4 hours of treatment, avoid wearing an under wire bra and to use an electric razor if they must shave. Pt verbalizes understanding of information given and will contact nursing with any questions or concerns.     Gloriajean Dell. Leonie Green, BSN

## 2020-04-14 ENCOUNTER — Other Ambulatory Visit: Payer: 59

## 2020-04-14 ENCOUNTER — Ambulatory Visit
Admission: RE | Admit: 2020-04-14 | Discharge: 2020-04-14 | Disposition: A | Discharge status: Home / Self Care | Payer: 59 | Source: Ambulatory Visit | Attending: Radiation Oncology | Admitting: Radiation Oncology

## 2020-04-14 DIAGNOSIS — C50412 Malignant neoplasm of upper-outer quadrant of left female breast: Secondary | ICD-10-CM | POA: Diagnosis not present

## 2020-04-14 DIAGNOSIS — Z20822 Contact with and (suspected) exposure to covid-19: Secondary | ICD-10-CM

## 2020-04-15 ENCOUNTER — Ambulatory Visit
Admission: RE | Admit: 2020-04-15 | Discharge: 2020-04-15 | Disposition: A | Discharge status: Home / Self Care | Payer: 59 | Source: Ambulatory Visit | Attending: Radiation Oncology | Admitting: Radiation Oncology

## 2020-04-15 DIAGNOSIS — C50412 Malignant neoplasm of upper-outer quadrant of left female breast: Secondary | ICD-10-CM | POA: Diagnosis not present

## 2020-04-15 LAB — NOVEL CORONAVIRUS, NAA: SARS-CoV-2, NAA: NOT DETECTED

## 2020-04-15 LAB — SARS-COV-2, NAA 2 DAY TAT

## 2020-04-15 NOTE — Progress Notes (Signed)
[place]  [phone] [phone]     ID: Carolyn Cooper DOB: [birth year]  MR#: [id number]  JYN#:[id number]  Patient Care Team: Janora Norlander, DO as PCP - General (Family Medicine) Mauro Kaufmann, RN as Oncology Nurse Navigator Rockwell Germany, RN as Oncology Nurse Navigator Courtnay Petrilla, Virgie Dad, MD as Consulting Physician (Oncology) Kyung Rudd, MD as Consulting Physician (Radiation Oncology) Erroll Luna, MD as Consulting Physician (General Surgery) Tamela Gammon, NP as Nurse Practitioner (Gynecology) Mauri Pole, MD as Consulting Physician (Gastroenterology) Lavonna Monarch, MD as Consulting Physician (Dermatology) Chauncey Cruel, MD OTHER MD:  CHIEF COMPLAINT: Invasive lobular breast cancer  CURRENT TREATMENT:    INTERVAL HISTORY: Carolyn Cooper returns today for follow up of her invasive lobular breast cancer. She was evaluated in the multidisciplinary breast cancer clinic on 02/06/2020.  Genetic testing performed during clinic showed negative results.  She underwent breast MRI on 02/11/2020 showing: breast composition C; 6 mm enhancing mass in posterior-lateral left breast reflecting recently diagnosed invasive carcinoma; no evidence of additional malignancy in bilateral breasts or lymph nodes.  She proceeded to left lumpectomy on 02/14/2020 under Dr. Brantley Stage. Pathology from the procedure ([id number]) showed: invasive lobular carcinoma, 1.2 cm, grade 2; margins not involved. Biopsied lymph node was negative for carcinoma (0/1).  Oncotype DX was obtained on the final surgical sample and the recurrence score of 16 predicts a risk of recurrence outside the breast over the next 9 years of 4%, if the patient's only systemic therapy is an antiestrogen for 5 years.  It also predicts no benefit from chemotherapy.   She was referred back to Dr. Lisbeth Renshaw on 03/27/2020 to review radiation therapy. She subsequently began treatment on  04/08/2020 and is scheduled through 05/05/2020.   REVIEW OF SYSTEMS: Carolyn Cooper did remarkably well with her surgery and she is tolerating her radiation equally well.  She has minimal fatigue and she thinks that is really primarily because of the stress of her husband having been diagnosed with a primary bone marrow disorder (he is followed by Dr. Lorenso Courier).  Her skin is also holding up well so far.   COVID 19 VACCINATION STATUS: Status post 2 doses, booster pending as of January 2022   HISTORY OF CURRENT ILLNESS: From the original intake note:  "Carolyn Cooper" had routine screening mammography on 01/02/2020 showing a possible abnormality in the left breast. She underwent left diagnostic mammography with tomography and left breast ultrasonography at [facility] on 01/15/2020 showing: breast density category C; suspicious 3 mm mass in left breast at 2:30; no left lymphadenopathy.  Accordingly on 01/28/2020 she proceeded to biopsy of the left breast area in question. The pathology from this procedure ([id number]) showed: invasive and in situ mammary carcinoma, e-cadherin negative, grade 2. Prognostic indicators significant for: estrogen receptor, >95% positive with strong staining intensity and progesterone receptor, 20% positive with moderate staining intensity. Proliferation marker Ki67 at 10%. HER2 equivocal by immunohistochemistry (2+), but negative by fluorescent in situ hybridization with a signals ratio 1.52 and number per cell 3.5.  The patient's subsequent history is as detailed below.   PAST MEDICAL HISTORY: Past Medical History:  Diagnosis Date  . Allergy    seasonal  . Anxiety   . Barrett's esophagus   . Breast cancer ([place]) 01/2020   left breast ILC  . Cataract    had surgery  . Depression   . Family history of breast cancer   . Family history of kidney cancer   .  Family history of ovarian cancer   . Family history of stomach cancer   . GERD (gastroesophageal reflux disease)   . IBS  (irritable bowel syndrome)   . Migraines   . Osteopenia 2019   T score -1.5 FRAX 8% / 0.7%    PAST SURGICAL HISTORY: Past Surgical History:  Procedure Laterality Date  . BREAST LUMPECTOMY WITH RADIOACTIVE SEED AND SENTINEL LYMPH NODE BIOPSY Left 02/14/2020   Procedure: LEFT BREAST LUMPECTOMY WITH RADIOACTIVE SEED AND SENTINEL LYMPH NODE MAPPING;  Surgeon: Erroll Luna, MD;  Location: [facility];  Service: General;  Laterality: Left;  PECTORAL BLOCK  . CATARACT EXTRACTION, BILATERAL  2018  . CERVICAL CONE BIOPSY  1996  . COLONOSCOPY  07/21/2007  . CYSTOSCOPY    . MOUTH SURGERY  1978   WISDOM TEETH    FAMILY HISTORY: Family History  Problem Relation Age of Onset  . Ovarian cancer Maternal Grandmother 60  . Diverticulitis Mother   . Heart disease Father   . Kidney cancer Father 34  . Stomach cancer Maternal Grandfather        dx 74s  . Breast cancer Other        dx >50, maternal great-aunt  . Breast cancer Other        dx >50, maternal great-aunt  . Breast cancer Other        dx >50, maternal great-aunt  . Colon cancer Neg Hx   . Esophageal cancer Neg Hx   . Rectal cancer Neg Hx    Her parents are living as of 02/2020-- her father at age 43 and her mother at age 62. Carolyn Cooper has one sister (and no brothers). She notes her mother had previously undergone genetic testing for BRCA 1 and 2, and she was negative. She reports ovarian cancer in her maternal grandmother at age 55, stomach cancer in her maternal grandfather in his 29's, and kidney cancer in her dad at age 27.   GYNECOLOGIC HISTORY:  No LMP recorded. Patient is postmenopausal. Menarche: 65 years old Age at first live birth: 65 years old [place] P 4 LMP 2015? Contraceptive: used "pretty much from 12 on" with no issues HRT used briefly, stopped "about 5 years ago"  Hysterectomy? no BSO? 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 Husband  Remo Lipps works for [employer]. Daughter Denton Meek, age 3, is a Cabin crew here in [place]. Son Delrae Rend, age 73, is unemployed. Daughter Markus Jarvis, age 13, is a homemaker here in [place]. Son Berdine Dance, age 17, works in Chartered loss adjuster in [place]. Carolyn Cooper has 4 grandchildren. She attends a [employer].    ADVANCED DIRECTIVES: In the absence of any documentation to the contrary, the patient's spouse is their HCPOA.    HEALTH MAINTENANCE: Social History   Tobacco Use  . Smoking status: Never Smoker  . Smokeless tobacco: Never Used  Vaping Use  . Vaping Use: Never used  Substance Use Topics  . Alcohol use: Not Currently    Alcohol/week: 0.0 standard drinks  . Drug use: No     Colonoscopy: 10/2017 (Dr. Silverio Decamp), repeat 2024  PAP: 01/2018, negative  Bone density: 05/2017, -1.5   Allergies  Allergen Reactions  . Sulfa Antibiotics Hives    Current Outpatient Medications  Medication Sig Dispense Refill  . anastrozole (ARIMIDEX) 1 MG tablet Take 1 tablet (1 mg total) by mouth daily. 90 tablet 4  . calcium carbonate (  OS-CAL) 600 MG TABS Take 600 mg by mouth 2 (two) times daily with a meal.    . Cholecalciferol (VITAMIN D) 400 UNITS capsule Take 800 Units by mouth daily.      Marland Kitchen escitalopram (LEXAPRO) 20 MG tablet Take 1 tablet (20 mg total) by mouth daily. 90 tablet 3  . hyoscyamine (LEVSIN SL) 0.125 MG SL tablet TAKE I CAPSULE BY MOUTH DAILY AS NEEDED 30 tablet 1  . ibuprofen (ADVIL) 800 MG tablet Take 1 tablet (800 mg total) by mouth every 8 (eight) hours as needed. 30 tablet 0  . Nutritional Supplements (IMMUNE ENHANCE PO) Take by mouth.    Marland Kitchen omeprazole (PRILOSEC) 40 MG capsule TAKE 1 CAPSULE BY MOUTH EVERY DAY 90 capsule 1  . Rimegepant Sulfate (NURTEC) 75 MG TBDP Take 1 tablet by mouth every other day. 16 tablet 11  . SUMAtriptan (IMITREX) 100 MG tablet May repeat in 2 hours if headache persists or recurs. 10 tablet prn  . vitamin C  (ASCORBIC ACID) 250 MG tablet Take 500 mg by mouth daily.     No current facility-administered medications for this visit.    OBJECTIVE: White woman who appears younger than stated age  7:   04/16/20 1308  BP: 133/71  Pulse: 83  Resp: 18  Temp: (!) 97.4 F (36.3 C)  SpO2: 98%     Body mass index is 28.91 kg/m.   Wt Readings from Last 3 Encounters:  04/16/20 176 lb 6.4 oz (80 kg)  03/27/20 180 lb 2 oz (81.7 kg)  02/14/20 176 lb 12.9 oz (80.2 kg)      ECOG FS:1 - Symptomatic but completely ambulatory  Sclerae unicteric, EOMs intact Wearing a mask No cervical or supraclavicular adenopathy Lungs no rales or rhonchi Heart regular rate and rhythm Abd soft, nontender, positive bowel sounds MSK no focal spinal tenderness, no upper extremity lymphedema Neuro: nonfocal, well oriented, appropriate affect Breasts: The right breast is benign.  The left breast is status post lumpectomy and is currently receiving radiation.  The cosmetic result is excellent.  The incision is healing very nicely with no erythema dehiscence or swelling.  There is minimal erythema over the breast.  Both axillae are benign.   LAB RESULTS:  CMP     Component Value Date/Time   NA 142 02/06/2020 0834   NA 142 05/15/2019 1034   K 3.8 02/06/2020 0834   CL 107 02/06/2020 0834   CO2 27 02/06/2020 0834   GLUCOSE 74 02/06/2020 0834   BUN 13 02/06/2020 0834   BUN 13 05/15/2019 1034   CREATININE 0.88 02/06/2020 0834   CREATININE 0.79 10/11/2012 1527   CALCIUM 9.1 02/06/2020 0834   PROT 7.0 02/06/2020 0834   PROT 6.8 05/15/2019 1034   ALBUMIN 4.0 02/06/2020 0834   ALBUMIN 4.4 05/15/2019 1034   AST 18 02/06/2020 0834   ALT 21 02/06/2020 0834   ALKPHOS 113 02/06/2020 0834   BILITOT 0.5 02/06/2020 0834   GFRNONAA >60 02/06/2020 0834   GFRAA >60 07/09/2019 0308    No results found for: TOTALPROTELP, ALBUMINELP, A1GS, A2GS, BETS, BETA2SER, GAMS, MSPIKE, SPEI  Lab Results  Component Value Date    WBC 7.5 04/16/2020   NEUTROABS 5.4 04/16/2020   HGB 13.1 04/16/2020   HCT 38.8 04/16/2020   MCV 96.8 04/16/2020   PLT 271 04/16/2020    No results found for: LABCA2  No components found for: [id number]  No results for input(s): INR in the last 168 hours.  No  results found for: LABCA2  No results found for: IHK742  No results found for: VZD638  No results found for: VFI433  No results found for: CA2729  No components found for: HGQUANT  No results found for: CEA1 / No results found for: CEA1   No results found for: AFPTUMOR  No results found for: CHROMOGRNA  No results found for: KPAFRELGTCHN, LAMBDASER, KAPLAMBRATIO (kappa/lambda light chains)  No results found for: HGBA, HGBA2QUANT, HGBFQUANT, HGBSQUAN (Hemoglobinopathy evaluation)   No results found for: LDH  No results found for: IRON, TIBC, IRONPCTSAT (Iron and TIBC)  No results found for: FERRITIN  Urinalysis    Component Value Date/Time   COLORURINE YELLOW 12/19/2015 1217   APPEARANCEUR Clear 10/18/2016 0859   LABSPEC 1.001 12/19/2015 1217   PHURINE 6.0 12/19/2015 1217   GLUCOSEU Negative 10/18/2016 0859   HGBUR NEGATIVE 12/19/2015 1217   BILIRUBINUR Negative 10/18/2016 0859   KETONESUR NEGATIVE 12/19/2015 1217   PROTEINUR Negative 10/18/2016 0859   PROTEINUR NEGATIVE 12/19/2015 1217   UROBILINOGEN 0.2 10/12/2013 1545   NITRITE Negative 10/18/2016 0859   NITRITE NEGATIVE 12/19/2015 1217   LEUKOCYTESUR 1+ (A) 10/18/2016 0859    STUDIES: No results found.   ELIGIBLE FOR AVAILABLE RESEARCH PROTOCOL: AET  ASSESSMENT: 65 y.o. [place] woman status post left breast upper outer quadrant biopsy 01/28/2020 for a clinical T1a/b N0, stage IA invasive lobular carcinoma, E-cadherin negative, estrogen and progesterone receptor positive, HER-2 not amplified, with an MIB-1 of 10%.  (1) status post left lumpectomy and sentinel lymph node sampling 02/14/2020 for a pT1c pN0, stage IA invasive lobular  carcinoma, grade 2, with negative margins  (a) 2 left axillary lymph nodes were removed  (2) Oncotype score of 16 predicts a risk of recurrence outside the breast in the next 9 years of 4% if the patient's only systemic therapy is antiestrogens for 5 years.  It also predicts no benefit from chemotherapy  (3) adjuvant radiation to be completed 05/05/2020  (4) to start anastrozole 05/19/2020   PLAN: Carolyn Cooper will complete her radiation treatments 05/05/2020.  She is tolerating that well.  We usually wait a couple of weeks before starting antiestrogen so there is less overlap between symptoms from the radiation and antiestrogens.  We discussed the difference between tamoxifen and anastrozole in detail. She understands that anastrozole and the aromatase inhibitors in general work by blocking estrogen production. Accordingly vaginal dryness, decrease in bone density, and of course hot flashes can result. The aromatase inhibitors can also negatively affect the cholesterol profile, although that is a minor effect. One out of 5 women on aromatase inhibitors we will feel "old and achy". This arthralgia/myalgia syndrome, which resembles fibromyalgia clinically, does resolve with stopping the medications. Accordingly this is not a reason to not try an aromatase inhibitor but it is a frequent reason to stop it (in other words 20% of women will not be able to tolerate these medications).  Tamoxifen on the other hand does not block estrogen production. It does not "take away a woman's estrogen". It blocks the estrogen receptor in breast cells. Like anastrozole, it can also cause hot flashes. As opposed to anastrozole, tamoxifen has many estrogen-like effects. It is technically an estrogen receptor modulator. This means that in some tissues tamoxifen works like estrogen-- for example it helps strengthen the bones. It tends to improve the cholesterol profile. It can cause thickening of the endometrial lining, and  even endometrial polyps or rarely cancer of the uterus.(The risk of uterine cancer due to  tamoxifen is one additional cancer per thousand women year). It can cause vaginal wetness or stickiness. It can cause blood clots through this estrogen-like effect--the risk of blood clots with tamoxifen is exactly the same as with birth control pills or hormone replacement.  Neither of these agents causes mood changes or weight gain, despite the popular belief that they can have these side effects. We have data from studies comparing either of these drugs with placebo, and in those cases the control group had the same amount of weight gain and depression as the group that took the drug.  I think she would be a good candidate for anastrozole.  We will start that 05/19/2020.  She will have a virtual visit with me late March.  If she is tolerating it well we will [facility], with a visit with me shortly following.  If we need to start her on bisphosphonates or denosumab we would discuss it at the May visit  She has a good understanding of the overall plan.  She agrees with it.  She knows her prognosis is excellent  Total encounter time 35 minutes.Sarajane Jews C. Karsen Fellows, MD 04/16/2020 1:33 PM Medical Oncology and Hematology [facility] [place], [place] [address] Tel. [phone]    Fax. [phone]   This document serves as a record of services personally performed by Lurline Del, MD. It was created on his behalf by Wilburn Mylar, a trained medical scribe. The creation of this record is based on the scribe's personal observations and the provider's statements to them.   I, Lurline Del MD, have reviewed the above documentation for accuracy and completeness, and I agree with the above.   *Total Encounter Time as defined by the Centers for Medicare and Medicaid Services includes, in addition to the  face-to-face time of a patient visit (documented in the note above) non-face-to-face time: obtaining and reviewing outside history, ordering and reviewing medications, tests or procedures, care coordination (communications with other health care professionals or caregivers) and documentation in the medical record.

## 2020-04-16 ENCOUNTER — Inpatient Hospital Stay: Payer: 59

## 2020-04-16 ENCOUNTER — Inpatient Hospital Stay: Payer: 59 | Attending: Oncology | Admitting: Oncology

## 2020-04-16 ENCOUNTER — Ambulatory Visit
Admission: RE | Admit: 2020-04-16 | Discharge: 2020-04-16 | Disposition: A | Discharge status: Home / Self Care | Payer: 59 | Source: Ambulatory Visit | Attending: Radiation Oncology | Admitting: Radiation Oncology

## 2020-04-16 ENCOUNTER — Other Ambulatory Visit: Payer: Self-pay

## 2020-04-16 VITALS — BP 133/71 | HR 83 | Temp 97.4°F | Resp 18 | Ht 65.5 in | Wt 176.4 lb

## 2020-04-16 DIAGNOSIS — M8580 Other specified disorders of bone density and structure, unspecified site: Secondary | ICD-10-CM | POA: Diagnosis not present

## 2020-04-16 DIAGNOSIS — Z170 Estrogen receptor positive status [ER+]: Secondary | ICD-10-CM | POA: Insufficient documentation

## 2020-04-16 DIAGNOSIS — E559 Vitamin D deficiency, unspecified: Secondary | ICD-10-CM | POA: Diagnosis not present

## 2020-04-16 DIAGNOSIS — C50412 Malignant neoplasm of upper-outer quadrant of left female breast: Secondary | ICD-10-CM

## 2020-04-16 LAB — COMPREHENSIVE METABOLIC PANEL
ALT: 26 U/L (ref 0–44)
AST: 20 U/L (ref 15–41)
Albumin: 4.2 g/dL (ref 3.5–5.0)
Alkaline Phosphatase: 107 U/L (ref 38–126)
Anion gap: 9 (ref 5–15)
BUN: 13 mg/dL (ref 8–23)
CO2: 26 mmol/L (ref 22–32)
Calcium: 9.5 mg/dL (ref 8.9–10.3)
Chloride: 106 mmol/L (ref 98–111)
Creatinine, Ser: 0.87 mg/dL (ref 0.44–1.00)
GFR, Estimated: >60 mL/min (ref >60)
Glucose, Bld: 91 mg/dL (ref 70–99)
Potassium: 3.7 mmol/L (ref 3.5–5.1)
Sodium: 141 mmol/L (ref 135–145)
Total Bilirubin: 0.6 mg/dL (ref 0.3–1.2)
Total Protein: 7.2 g/dL (ref 6.5–8.1)

## 2020-04-16 LAB — CBC WITH DIFFERENTIAL/PLATELET
Abs Immature Granulocytes: 0.02 10*3/uL (ref 0.00–0.07)
Basophils Absolute: 0 10*3/uL (ref 0.0–0.1)
Basophils Relative: 0 %
Eosinophils Absolute: 0.1 10*3/uL (ref 0.0–0.5)
Eosinophils Relative: 1 %
HCT: 38.8 % (ref 36.0–46.0)
Hemoglobin: 13.1 g/dL (ref 12.0–15.0)
Immature Granulocytes: 0 %
Lymphocytes Relative: 19 %
Lymphs Abs: 1.4 10*3/uL (ref 0.7–4.0)
MCH: 32.7 pg (ref 26.0–34.0)
MCHC: 33.8 g/dL (ref 30.0–36.0)
MCV: 96.8 fL (ref 80.0–100.0)
Monocytes Absolute: 0.6 10*3/uL (ref 0.1–1.0)
Monocytes Relative: 8 %
Neutro Abs: 5.4 10*3/uL (ref 1.7–7.7)
Neutrophils Relative %: 72 %
Platelets: 271 10*3/uL (ref 150–400)
RBC: 4.01 MIL/uL (ref 3.87–5.11)
RDW: 12.4 % (ref 11.5–15.5)
WBC: 7.5 10*3/uL (ref 4.0–10.5)
nRBC: 0 % (ref 0.0–0.2)

## 2020-04-16 MED ORDER — ANASTROZOLE 1 MG PO TABS: 1.0000 mg | ORAL_TABLET | Freq: Every day | ORAL | 4 refills | Status: DC

## 2020-04-17 ENCOUNTER — Ambulatory Visit
Admission: RE | Admit: 2020-04-17 | Discharge: 2020-04-17 | Disposition: A | Discharge status: Home / Self Care | Payer: 59 | Source: Ambulatory Visit | Attending: Radiation Oncology | Admitting: Radiation Oncology

## 2020-04-17 DIAGNOSIS — C50412 Malignant neoplasm of upper-outer quadrant of left female breast: Secondary | ICD-10-CM | POA: Diagnosis not present

## 2020-04-18 ENCOUNTER — Ambulatory Visit
Admission: RE | Admit: 2020-04-18 | Discharge: 2020-04-18 | Disposition: A | Discharge status: Home / Self Care | Payer: 59 | Source: Ambulatory Visit | Attending: Radiation Oncology | Admitting: Radiation Oncology

## 2020-04-18 DIAGNOSIS — C50412 Malignant neoplasm of upper-outer quadrant of left female breast: Secondary | ICD-10-CM | POA: Diagnosis not present

## 2020-04-21 ENCOUNTER — Ambulatory Visit
Admission: RE | Admit: 2020-04-21 | Discharge: 2020-04-21 | Disposition: A | Discharge status: Home / Self Care | Payer: 59 | Source: Ambulatory Visit | Attending: Radiation Oncology | Admitting: Radiation Oncology

## 2020-04-21 DIAGNOSIS — C50412 Malignant neoplasm of upper-outer quadrant of left female breast: Secondary | ICD-10-CM | POA: Diagnosis not present

## 2020-04-22 ENCOUNTER — Other Ambulatory Visit: Payer: Self-pay | Admitting: Family Medicine

## 2020-04-22 ENCOUNTER — Ambulatory Visit
Admission: RE | Admit: 2020-04-22 | Discharge: 2020-04-22 | Disposition: A | Discharge status: Home / Self Care | Payer: 59 | Source: Ambulatory Visit | Attending: Radiation Oncology | Admitting: Radiation Oncology

## 2020-04-22 DIAGNOSIS — G43809 Other migraine, not intractable, without status migrainosus: Secondary | ICD-10-CM

## 2020-04-22 DIAGNOSIS — C50412 Malignant neoplasm of upper-outer quadrant of left female breast: Secondary | ICD-10-CM | POA: Diagnosis not present

## 2020-04-22 NOTE — Telephone Encounter (Signed)
This was ordered with PRN refills on 12/21.  Please call pharmacy to confirm rx.

## 2020-04-23 ENCOUNTER — Ambulatory Visit
Admission: RE | Admit: 2020-04-23 | Discharge: 2020-04-23 | Disposition: A | Discharge status: Home / Self Care | Payer: 59 | Source: Ambulatory Visit | Attending: Radiation Oncology | Admitting: Radiation Oncology

## 2020-04-23 DIAGNOSIS — C50412 Malignant neoplasm of upper-outer quadrant of left female breast: Secondary | ICD-10-CM | POA: Diagnosis not present

## 2020-04-24 ENCOUNTER — Ambulatory Visit
Admission: RE | Admit: 2020-04-24 | Discharge: 2020-04-24 | Disposition: A | Discharge status: Home / Self Care | Payer: 59 | Source: Ambulatory Visit | Attending: Radiation Oncology | Admitting: Radiation Oncology

## 2020-04-24 DIAGNOSIS — C50412 Malignant neoplasm of upper-outer quadrant of left female breast: Secondary | ICD-10-CM | POA: Diagnosis not present

## 2020-04-25 ENCOUNTER — Ambulatory Visit: Payer: 59 | Admitting: Radiation Oncology

## 2020-04-25 ENCOUNTER — Ambulatory Visit
Admission: RE | Admit: 2020-04-25 | Discharge: 2020-04-25 | Disposition: A | Discharge status: Home / Self Care | Payer: 59 | Source: Ambulatory Visit | Attending: Radiation Oncology | Admitting: Radiation Oncology

## 2020-04-25 DIAGNOSIS — C50412 Malignant neoplasm of upper-outer quadrant of left female breast: Secondary | ICD-10-CM | POA: Diagnosis not present

## 2020-04-28 ENCOUNTER — Other Ambulatory Visit: Payer: Self-pay

## 2020-04-28 ENCOUNTER — Ambulatory Visit
Admission: RE | Admit: 2020-04-28 | Discharge: 2020-04-28 | Disposition: A | Discharge status: Home / Self Care | Payer: 59 | Source: Ambulatory Visit | Attending: Radiation Oncology | Admitting: Radiation Oncology

## 2020-04-28 DIAGNOSIS — C50412 Malignant neoplasm of upper-outer quadrant of left female breast: Secondary | ICD-10-CM | POA: Diagnosis not present

## 2020-04-29 ENCOUNTER — Ambulatory Visit
Admission: RE | Admit: 2020-04-29 | Discharge: 2020-04-29 | Disposition: A | Discharge status: Home / Self Care | Payer: 59 | Source: Ambulatory Visit | Attending: Radiation Oncology | Admitting: Radiation Oncology

## 2020-04-29 DIAGNOSIS — C50412 Malignant neoplasm of upper-outer quadrant of left female breast: Secondary | ICD-10-CM | POA: Diagnosis not present

## 2020-04-30 ENCOUNTER — Ambulatory Visit
Admission: RE | Admit: 2020-04-30 | Discharge: 2020-04-30 | Disposition: A | Discharge status: Home / Self Care | Payer: 59 | Source: Ambulatory Visit | Attending: Radiation Oncology | Admitting: Radiation Oncology

## 2020-04-30 ENCOUNTER — Other Ambulatory Visit: Payer: Self-pay

## 2020-04-30 DIAGNOSIS — C50412 Malignant neoplasm of upper-outer quadrant of left female breast: Secondary | ICD-10-CM | POA: Diagnosis not present

## 2020-05-01 ENCOUNTER — Ambulatory Visit
Admission: RE | Admit: 2020-05-01 | Discharge: 2020-05-01 | Disposition: A | Discharge status: Home / Self Care | Payer: 59 | Source: Ambulatory Visit | Attending: Radiation Oncology | Admitting: Radiation Oncology

## 2020-05-01 ENCOUNTER — Other Ambulatory Visit: Payer: Self-pay

## 2020-05-01 DIAGNOSIS — C50412 Malignant neoplasm of upper-outer quadrant of left female breast: Secondary | ICD-10-CM | POA: Diagnosis not present

## 2020-05-02 ENCOUNTER — Other Ambulatory Visit: Payer: Self-pay

## 2020-05-02 ENCOUNTER — Ambulatory Visit
Admission: RE | Admit: 2020-05-02 | Discharge: 2020-05-02 | Disposition: A | Discharge status: Home / Self Care | Payer: 59 | Source: Ambulatory Visit | Attending: Radiation Oncology | Admitting: Radiation Oncology

## 2020-05-02 DIAGNOSIS — Z803 Family history of malignant neoplasm of breast: Secondary | ICD-10-CM

## 2020-05-02 DIAGNOSIS — C50412 Malignant neoplasm of upper-outer quadrant of left female breast: Secondary | ICD-10-CM | POA: Diagnosis not present

## 2020-05-02 MED ORDER — RADIAPLEXRX EX GEL: Freq: Once | CUTANEOUS | Status: AC

## 2020-05-05 ENCOUNTER — Ambulatory Visit
Admission: RE | Admit: 2020-05-05 | Discharge: 2020-05-05 | Disposition: A | Discharge status: Home / Self Care | Payer: 59 | Source: Ambulatory Visit | Attending: Radiation Oncology | Admitting: Radiation Oncology

## 2020-05-05 ENCOUNTER — Encounter: Payer: Self-pay | Admitting: *Deleted

## 2020-05-05 DIAGNOSIS — C50412 Malignant neoplasm of upper-outer quadrant of left female breast: Secondary | ICD-10-CM | POA: Diagnosis not present

## 2020-05-06 ENCOUNTER — Encounter: Payer: Self-pay | Admitting: Radiation Oncology

## 2020-05-06 NOTE — Progress Notes (Signed)
  Patient Name: Carolyn Cooper MRN: [id number] DOB: 07/23/1955 Referring Physician: Erroll Luna (Profile Not Attached) Date of Service: 05/05/2020 [facility], [place]                                                        End Of Treatment Note  Diagnoses: C50.412-Malignant neoplasm of upper-outer quadrant of left female breast  Cancer Staging: Stage IA, pT1cN0M0 grade 2, ER/PR positive invasive lobular carcinoma of the left breast.  Intent: Curative  Radiation Treatment Dates: 04/08/2020 through 05/05/2020 Site Technique Total Dose (Gy) Dose per Fx (Gy) Completed Fx Beam Energies  Breast, Left: Breast_Lt 3D 42.56/42.56 2.66 16/16 6X, 10X  Breast, Left: Breast_Lt_Bst 3D 8/8 2 4/4 6X, 10X   Narrative: The patient tolerated radiation therapy relatively well. She had a covid exposure within her household and was treated by our staff with covid precautions while awaiting testing, but ultimately tested negative herself. She was able to complete treatment without significant disruption.  Plan: The patient will be contacted by phone from the radiation oncology department in about 1 month's time.  ________________________________________________    Carola Rhine, PAC

## 2020-05-07 NOTE — Progress Notes (Signed)
  Radiation Oncology         [phone] ________________________________  Name: Carolyn Cooper MRN: [id number]  Date: 04/08/2020  DOB: [birth year]  SIMULATION NOTE   NARRATIVE:  The patient underwent simulation today for ongoing radiation therapy.  The existing CT study set was employed for the purpose of virtual treatment planning.  The target and avoidance structures were reviewed and modified as necessary.  Treatment planning then occurred.  The radiation boost prescription was entered and confirmed.  A total of 3 complex treatment devices were fabricated in the form of multi-leaf collimators to shape radiation around the targets while maximally excluding nearby normal structures. I have requested : Isodose Plan.    PLAN:  This modified radiation beam arrangement is intended to continue the current radiation dose to an additional 8 Gy in 4 fractions for a total cumulative dose of 50.56 Gy.    ------------------------------------------------  Jodelle Gross, MD, PhD

## 2020-05-25 NOTE — Progress Notes (Signed)
[place]  [phone] [phone]     ID: Carolyn Cooper DOB: 22-Oct-1955  MR#: [id number]  OVF#:[id number]  Patient Care Team: Janora Norlander, DO as PCP - General (Family Medicine) Mauro Kaufmann, RN as Oncology Nurse Navigator Rockwell Germany, RN as Oncology Nurse Navigator Garrin Kirwan, Virgie Dad, MD as Consulting Physician (Oncology) Kyung Rudd, MD as Consulting Physician (Radiation Oncology) Erroll Luna, MD as Consulting Physician (General Surgery) Tamela Gammon, NP as Nurse Practitioner (Gynecology) Mauri Pole, MD as Consulting Physician (Gastroenterology) Lavonna Monarch, MD as Consulting Physician (Dermatology) Chauncey Cruel, MD OTHER MD:  I connected with [name] on 05/26/20 at  9:00 AM EST by video enabled telemedicine visit and verified that I am speaking with the correct person using two identifiers.   I discussed the limitations, risks, security and privacy concerns of performing an evaluation and management service by telemedicine and the availability of in-person appointments. I also discussed with the patient that there may be a patient responsible charge related to this service. The patient expressed understanding and agreed to proceed.   Other persons participating in the visit and their role in the encounter: None  Patient's location: Home Provider's location: [place]  Total time spent: 15 min   CHIEF COMPLAINT: Invasive lobular breast cancer  CURRENT TREATMENT: anastrozole   INTERVAL HISTORY: Carolyn Cooper was contacted today for follow up of her invasive lobular breast cancer.   Since her last visit, she completed radiation therapy treatments under Dr. Lisbeth Renshaw on 05/05/2020.  She started anastrozole last week on 05/19/2020.   REVIEW OF SYSTEMS: Carolyn Cooper tells me she generally did well with her radiation treatments until the very end and then she got a little bit of a burn.   She did peel.  She was sore particularly in the armpit.  She also palpated some irregularities in the skin.  All that is now much better she says.  She started anastrozole a week ago.  So far she has had no side effects although she describes herself as a little bit irritable.  Of course she is going through the whole breast cancer thing and her husband also has cancer.  This is very stressful.  A detailed review of systems today was otherwise stable   COVID 19 VACCINATION STATUS: Status post 2 doses, booster pending as of January 2022   HISTORY OF CURRENT ILLNESS: From the original intake note:  "Carolyn Cooper" had routine screening mammography on 01/02/2020 showing a possible abnormality in the left breast. She underwent left diagnostic mammography with tomography and left breast ultrasonography at [facility] on 01/15/2020 showing: breast density category C; suspicious 3 mm mass in left breast at 2:30; no left lymphadenopathy.  Accordingly on 01/28/2020 she proceeded to biopsy of the left breast area in question. The pathology from this procedure ([id number]) showed: invasive and in situ mammary carcinoma, e-cadherin negative, grade 2. Prognostic indicators significant for: estrogen receptor, >95% positive with strong staining intensity and progesterone receptor, 20% positive with moderate staining intensity. Proliferation marker Ki67 at 10%. HER2 equivocal by immunohistochemistry (2+), but negative by fluorescent in situ hybridization with a signals ratio 1.52 and number per cell 3.5.  The patient's subsequent history is as detailed below.   PAST MEDICAL HISTORY: Past Medical History:  Diagnosis Date  . Allergy    seasonal  . Anxiety   . Barrett's esophagus   . Breast cancer ([place]) 01/2020   left breast ILC  .  Cataract    had surgery  . Depression   . Family history of breast cancer   . Family history of kidney cancer   . Family history of ovarian cancer   . Family history of stomach  cancer   . GERD (gastroesophageal reflux disease)   . IBS (irritable bowel syndrome)   . Migraines   . Osteopenia 2019   T score -1.5 FRAX 8% / 0.7%    PAST SURGICAL HISTORY: Past Surgical History:  Procedure Laterality Date  . BREAST LUMPECTOMY WITH RADIOACTIVE SEED AND SENTINEL LYMPH NODE BIOPSY Left 02/14/2020   Procedure: LEFT BREAST LUMPECTOMY WITH RADIOACTIVE SEED AND SENTINEL LYMPH NODE MAPPING;  Surgeon: Erroll Luna, MD;  Location: [facility];  Service: General;  Laterality: Left;  PECTORAL BLOCK  . CATARACT EXTRACTION, BILATERAL  2018  . CERVICAL CONE BIOPSY  1996  . COLONOSCOPY  07/21/2007  . CYSTOSCOPY    . MOUTH SURGERY  1978   WISDOM TEETH    FAMILY HISTORY: Family History  Problem Relation Age of Onset  . Ovarian cancer Maternal Grandmother 60  . Diverticulitis Mother   . Heart disease Father   . Kidney cancer Father 62  . Stomach cancer Maternal Grandfather        dx 81s  . Breast cancer Other        dx >50, maternal great-aunt  . Breast cancer Other        dx >50, maternal great-aunt  . Breast cancer Other        dx >50, maternal great-aunt  . Colon cancer Neg Hx   . Esophageal cancer Neg Hx   . Rectal cancer Neg Hx    Her parents are living as of 02/2020-- her father at age 47 and her mother at age 38. Carolyn Cooper has one sister (and no brothers). She notes her mother had previously undergone genetic testing for BRCA 1 and 2, and she was negative. She reports ovarian cancer in her maternal grandmother at age 16, stomach cancer in her maternal grandfather in his 32's, and kidney cancer in her dad at age 39.   GYNECOLOGIC HISTORY:  No LMP recorded. Patient is postmenopausal. Menarche: 65 years old Age at first live birth: 65 years old Bud P 4 LMP 2015? Contraceptive: used "pretty much from 38 on" with no issues HRT used briefly, stopped "about 5 years ago"  Hysterectomy? no BSO? no   SOCIAL HISTORY: (updated 02/2020)  Carolyn Cooper is  currently retired from working as a Horticulturist, commercial. Husband Remo Lipps works for [employer]. Daughter Denton Meek, age 30, is a Cabin crew here in [place]. Son Delrae Rend, age 51, is unemployed. Daughter Markus Jarvis, age 79, is a homemaker here in [place]. Son Berdine Dance, age 61, works in Chartered loss adjuster in [place]. Carolyn Cooper has 4 grandchildren. She attends a [employer].    ADVANCED DIRECTIVES: In the absence of any documentation to the contrary, the patient's spouse is their HCPOA.    HEALTH MAINTENANCE: Social History   Tobacco Use  . Smoking status: Never Smoker  . Smokeless tobacco: Never Used  Vaping Use  . Vaping Use: Never used  Substance Use Topics  . Alcohol use: Not Currently    Alcohol/week: 0.0 standard drinks  . Drug use: No     Colonoscopy: 10/2017 (Dr. Silverio Decamp), repeat 2024  PAP: 01/2018, negative  Bone density: 05/2017, -1.5   Allergies  Allergen Reactions  . Sulfa Antibiotics Hives    Current Outpatient Medications  Medication Sig Dispense Refill  . anastrozole (ARIMIDEX) 1 MG tablet Take 1 tablet (1 mg total) by mouth daily. 90 tablet 4  . calcium carbonate (OS-CAL) 600 MG TABS Take 600 mg by mouth 2 (two) times daily with a meal.    . Cholecalciferol (VITAMIN D) 400 UNITS capsule Take 800 Units by mouth daily.      Marland Kitchen escitalopram (LEXAPRO) 20 MG tablet Take 1 tablet (20 mg total) by mouth daily. 90 tablet 3  . hyoscyamine (LEVSIN SL) 0.125 MG SL tablet TAKE I CAPSULE BY MOUTH DAILY AS NEEDED 30 tablet 1  . ibuprofen (ADVIL) 800 MG tablet Take 1 tablet (800 mg total) by mouth every 8 (eight) hours as needed. 30 tablet 0  . Nutritional Supplements (IMMUNE ENHANCE PO) Take by mouth.    Marland Kitchen omeprazole (PRILOSEC) 40 MG capsule TAKE 1 CAPSULE BY MOUTH EVERY DAY 90 capsule 1  . Rimegepant Sulfate (NURTEC) 75 MG TBDP Take 1 tablet by mouth every other day. 16 tablet 11  . SUMAtriptan (IMITREX) 100 MG tablet TAKE 1 TAB BY MOUTH  EVERY 2 HOURS AS NEEDED MAY REPEAT IN 2 HOURS IF HEADACHE PERSISTS 9 tablet 1  . vitamin C (ASCORBIC ACID) 250 MG tablet Take 500 mg by mouth daily.     No current facility-administered medications for this visit.    OBJECTIVE: White woman contacted by video  There were no vitals filed for this visit.   There is no height or weight on file to calculate BMI.   Wt Readings from Last 3 Encounters:  04/16/20 176 lb 6.4 oz (80 kg)  03/27/20 180 lb 2 oz (81.7 kg)  02/14/20 176 lb 12.9 oz (80.2 kg)      ECOG FS:1 - Symptomatic but completely ambulatory  Telemedicine 05/26/2020  LAB RESULTS:  CMP     Component Value Date/Time   NA 141 04/16/2020 1232   NA 142 05/15/2019 1034   K 3.7 04/16/2020 1232   CL 106 04/16/2020 1232   CO2 26 04/16/2020 1232   GLUCOSE 91 04/16/2020 1232   BUN 13 04/16/2020 1232   BUN 13 05/15/2019 1034   CREATININE 0.87 04/16/2020 1232   CREATININE 0.88 02/06/2020 0834   CREATININE 0.79 10/11/2012 1527   CALCIUM 9.5 04/16/2020 1232   PROT 7.2 04/16/2020 1232   PROT 6.8 05/15/2019 1034   ALBUMIN 4.2 04/16/2020 1232   ALBUMIN 4.4 05/15/2019 1034   AST 20 04/16/2020 1232   AST 18 02/06/2020 0834   ALT 26 04/16/2020 1232   ALT 21 02/06/2020 0834   ALKPHOS 107 04/16/2020 1232   BILITOT 0.6 04/16/2020 1232   BILITOT 0.5 02/06/2020 0834   GFRNONAA >60 04/16/2020 1232   GFRNONAA >60 02/06/2020 0834   GFRAA >60 07/09/2019 0308    No results found for: TOTALPROTELP, ALBUMINELP, A1GS, A2GS, BETS, BETA2SER, GAMS, MSPIKE, SPEI  Lab Results  Component Value Date   WBC 7.5 04/16/2020   NEUTROABS 5.4 04/16/2020   HGB 13.1 04/16/2020   HCT 38.8 04/16/2020   MCV 96.8 04/16/2020   PLT 271 04/16/2020    No results found for: LABCA2  No components found for: ESPQZR007  No results for input(s): INR in the last 168 hours.  No results found for: LABCA2  No results found for: MAU633  No results found for: HLK562  No results found for: BWL893  No  results found for: CA2729  No components found for: HGQUANT  No results found for: CEA1 / No results found  for: CEA1   No results found for: AFPTUMOR  No results found for: CHROMOGRNA  No results found for: KPAFRELGTCHN, LAMBDASER, KAPLAMBRATIO (kappa/lambda light chains)  No results found for: HGBA, HGBA2QUANT, HGBFQUANT, HGBSQUAN (Hemoglobinopathy evaluation)   No results found for: LDH  No results found for: IRON, TIBC, IRONPCTSAT (Iron and TIBC)  No results found for: FERRITIN  Urinalysis    Component Value Date/Time   COLORURINE YELLOW 12/19/2015 1217   APPEARANCEUR Clear 10/18/2016 0859   LABSPEC 1.001 12/19/2015 1217   PHURINE 6.0 12/19/2015 1217   GLUCOSEU Negative 10/18/2016 [place] 12/19/2015 1217   BILIRUBINUR Negative 10/18/2016 0859   [place] 12/19/2015 1217   PROTEINUR Negative 10/18/2016 0859   PROTEINUR NEGATIVE 12/19/2015 1217   UROBILINOGEN 0.2 10/12/2013 1545   NITRITE Negative 10/18/2016 0859   NITRITE NEGATIVE 12/19/2015 1217   LEUKOCYTESUR 1+ (A) 10/18/2016 0859    STUDIES: No results found.   ELIGIBLE FOR AVAILABLE RESEARCH PROTOCOL: AET  ASSESSMENT: 65 y.o. [place] woman status post left breast upper outer quadrant biopsy 01/28/2020 for a clinical T1a/b N0, stage IA invasive lobular carcinoma, E-cadherin negative, estrogen and progesterone receptor positive, HER-2 not amplified, with an MIB-1 of 10%.  (1) status post left lumpectomy and sentinel lymph node sampling 02/14/2020 for a pT1c pN0, stage IA invasive lobular carcinoma, grade 2, with negative margins  (a) 2 left axillary lymph nodes were removed  (2) Oncotype score of 16 predicts a risk of recurrence outside the breast in the next 9 years of 4% if the patient's only systemic therapy is antiestrogens for 5 years.  It also predicts no benefit from chemotherapy  (3) adjuvant radiation: Radiation Treatment Dates: 04/08/2020 through 05/05/2020 Site Technique  Total Dose (Gy) Dose per Fx (Gy) Completed Fx Beam Energies  Breast, Left: Breast_Lt 3D 42.56/42.56 2.66 16/16 6X, 10X  Breast, Left: Breast_Lt_Bst 3D 8/8 2 4/4 6X, 10X   (4) started anastrozole 05/19/2020   PLAN: Debbie tolerated radiation generally well and is already healing from the desquamation.  She does not have significant postsurgical symptoms and is already scheduled to go to physical therapy later this week.  The plan now is to continue anastrozole for a total of 5 years.  She did not have any co-pay for that medication.  I think the irritability she is feeling is really not going to be due to that but to her very difficult situation with both she and her husband having cancer.  We talked about what she can do to work on that and she is going to church and praying and I also suggested 15 to 20-minute walks which would be her time and she was just dedicated to herself and trying to "center".  Otherwise she will have her next mammography towards the end of May.  We will obtain a DEXA scan at the same time and I will see her at that time  Total encounter time 15 minutes.Sarajane Jews C. Etherine Mackowiak, MD 05/26/2020 8:53 AM Medical Oncology and Hematology [facility] [place], [place] [address] Tel. [phone]    Fax. [phone]   This document serves as a record of services personally performed by Lurline Del, MD. It was created on his behalf by Wilburn Mylar, a trained medical scribe. The creation of this record is based on the scribe's personal observations and the provider's statements to them.   Lindie Spruce MD, have reviewed the above documentation for accuracy and completeness, and  I agree with the above.   *Total Encounter Time as defined by the Centers for Medicare and Medicaid Services includes, in addition to the face-to-face time of a patient visit (documented in the note above) non-face-to-face time: obtaining and reviewing outside  history, ordering and reviewing medications, tests or procedures, care coordination (communications with other health care professionals or caregivers) and documentation in the medical record.

## 2020-05-26 ENCOUNTER — Inpatient Hospital Stay: Payer: 59 | Attending: Oncology | Admitting: Oncology

## 2020-05-26 DIAGNOSIS — Z170 Estrogen receptor positive status [ER+]: Secondary | ICD-10-CM | POA: Diagnosis not present

## 2020-05-26 DIAGNOSIS — C50412 Malignant neoplasm of upper-outer quadrant of left female breast: Secondary | ICD-10-CM | POA: Diagnosis not present

## 2020-05-26 DIAGNOSIS — M8580 Other specified disorders of bone density and structure, unspecified site: Secondary | ICD-10-CM | POA: Diagnosis not present

## 2020-05-26 DIAGNOSIS — Z79811 Long term (current) use of aromatase inhibitors: Secondary | ICD-10-CM | POA: Insufficient documentation

## 2020-05-26 DIAGNOSIS — Z923 Personal history of irradiation: Secondary | ICD-10-CM | POA: Insufficient documentation

## 2020-05-27 ENCOUNTER — Telehealth: Payer: Self-pay | Admitting: Oncology

## 2020-05-27 NOTE — Telephone Encounter (Signed)
Scheduled appts per 2/21 los. Pt confirmed appt date and time.

## 2020-05-28 NOTE — Progress Notes (Signed)
  Radiation Oncology         [phone] ________________________________  Name: Carolyn Cooper MRN: [id number]  Date of Service: 06/02/2020  DOB: [birth year]  Post Treatment Telephone Note  Diagnosis:   C50.412-Malignant neoplasm of upper-outer quadrant of left female breast  Interval Since Last Radiation:  4 weeks  Radiation Treatment Dates: 04/08/2020 through 05/05/2020 Site Technique Total Dose (Gy) Dose per Fx (Gy) Completed Fx Beam Energies  Breast, Left: Breast_Lt 3D 42.56/42.56 2.66 16/16 6X, 10X  Breast, Left: Breast_Lt_Bst 3D 8/8 2 4/4 6X, 10X      Narrative:  The patient was contacted today for routine follow-up. During treatment she did very well with radiotherapy and did not have significant desquamation. She reports she that her skin is doing well. Advised to use SPF 30 or higher. Advised that skin will be sensitive for 1 year. Advised to wear protective clothing to avoid direct sun exposure.   Impression/Plan: 1. Malignant neoplasm of upper-outer quadrant of left female breast. The patient has been doing well since completion of radiotherapy. We discussed that we would be happy to continue to follow her as needed, but she will also continue to follow up with Dr. Ron Agee in medical oncology. She was counseled on skin care as well as measures to avoid sun exposure to this area.  2. Survivorship. We discussed the importance of survivorship evaluation and encouraged her to attend her upcoming visit with that clinic.     Loma Messing, LPN

## 2020-06-02 ENCOUNTER — Ambulatory Visit
Admission: RE | Admit: 2020-06-02 | Discharge: 2020-06-02 | Disposition: A | Discharge status: Home / Self Care | Payer: 59 | Source: Ambulatory Visit | Attending: Radiation Oncology | Admitting: Radiation Oncology

## 2020-06-02 ENCOUNTER — Other Ambulatory Visit: Payer: Self-pay

## 2020-06-02 ENCOUNTER — Telehealth: Payer: Self-pay

## 2020-06-02 ENCOUNTER — Ambulatory Visit: Payer: Self-pay

## 2020-06-02 DIAGNOSIS — Z170 Estrogen receptor positive status [ER+]: Secondary | ICD-10-CM | POA: Insufficient documentation

## 2020-06-02 DIAGNOSIS — C50412 Malignant neoplasm of upper-outer quadrant of left female breast: Secondary | ICD-10-CM | POA: Insufficient documentation

## 2020-06-02 NOTE — Telephone Encounter (Signed)
Spoke with patient in regards to post treatment radiation symptoms. Advised patient if there are any issues to please contact the radiation department. Patient verbalized understanding. TM

## 2020-06-04 ENCOUNTER — Encounter: Payer: Self-pay | Admitting: Family Medicine

## 2020-06-06 ENCOUNTER — Encounter: Payer: Self-pay | Admitting: Oncology

## 2020-06-06 ENCOUNTER — Telehealth: Payer: Self-pay | Admitting: Licensed Clinical Social Worker

## 2020-06-06 ENCOUNTER — Telehealth: Payer: Self-pay

## 2020-06-06 ENCOUNTER — Ambulatory Visit (INDEPENDENT_AMBULATORY_CARE_PROVIDER_SITE_OTHER): Payer: 59 | Admitting: Family Medicine

## 2020-06-06 DIAGNOSIS — M7061 Trochanteric bursitis, right hip: Secondary | ICD-10-CM

## 2020-06-06 DIAGNOSIS — M7062 Trochanteric bursitis, left hip: Secondary | ICD-10-CM

## 2020-06-06 MED ORDER — IBUPROFEN 800 MG PO TABS: 800.0000 mg | ORAL_TABLET | Freq: Three times a day (TID) | ORAL | 3 refills | Status: DC | PRN

## 2020-06-06 NOTE — Progress Notes (Signed)
Secure chat sent to social workers regarding any additional resources as well.

## 2020-06-06 NOTE — Telephone Encounter (Signed)
[name]  Clinical Social Work was referred by [facility] for financial assistance needs.  Clinical Social Worker attempted to contact patient by phone  to offer support and assess for needs.    No answer. Left VM with information on potential assistance as well as direct contact information.  As patient is out of active treatment, she is not eligible for most breast cancer foundations. [employer] may be a possibility if patient meets other eligibility criteria.     [name], [name] Worker [employer]

## 2020-06-06 NOTE — Progress Notes (Signed)
Telephone visit  Subjective: CC: PT referral PCP: Janora Norlander, DO OFB:[facility] Carolyn Cooper is a 65 y.o. female calls for telephone consult today. Patient provides verbal consent for consult held via phone.  Due to COVID-19 pandemic this visit was conducted virtually. This visit type was conducted due to national recommendations for restrictions regarding the COVID-19 Pandemic (e.g. social distancing, sheltering in place) in an effort to limit this patient's exposure and mitigate transmission in our community. All issues noted in this document were discussed and addressed.  A physical exam was not performed with this format.   Location of patient: store Location of provider: [facility] Others present for call: none  1.  Hip bursitis Patient reports bilateral hip bursitis that has been ongoing for several years now.  She is status post corticosteroid injection, NSAIDs.  She had imaging of the hips and this showed bursitis.  She was referred to physical therapy but this was interrupted secondary to cancer treatments.  She is asking for a new referral in [place] for treatment.  She notes that she has quite a bit of discomfort, particularly when she is laying on that side during sleep and often has to change positions to get comfortable.  Currently taking meloxicam but has not found this to be especially helpful.   ROS: Per HPI  Allergies  Allergen Reactions  . Sulfa Antibiotics Hives   Past Medical History:  Diagnosis Date  . Allergy    seasonal  . Anxiety   . Barrett's esophagus   . Breast cancer ([place]) 01/2020   left breast ILC  . Cataract    had surgery  . Depression   . Family history of breast cancer   . Family history of kidney cancer   . Family history of ovarian cancer   . Family history of stomach cancer   . GERD (gastroesophageal reflux disease)   . IBS (irritable bowel syndrome)   . Migraines   . Osteopenia 2019   T score -1.5 FRAX 8% / 0.7%    Current Outpatient  Medications:  .  anastrozole (ARIMIDEX) 1 MG tablet, Take 1 tablet (1 mg total) by mouth daily., Disp: 90 tablet, Rfl: 4 .  calcium carbonate (OS-CAL) 600 MG TABS, Take 600 mg by mouth 2 (two) times daily with a meal., Disp: , Rfl:  .  Cholecalciferol (VITAMIN D) 400 UNITS capsule, Take 800 Units by mouth daily.  , Disp: , Rfl:  .  escitalopram (LEXAPRO) 20 MG tablet, Take 1 tablet (20 mg total) by mouth daily., Disp: 90 tablet, Rfl: 3 .  hyoscyamine (LEVSIN SL) 0.125 MG SL tablet, TAKE I CAPSULE BY MOUTH DAILY AS NEEDED, Disp: 30 tablet, Rfl: 1 .  ibuprofen (ADVIL) 800 MG tablet, Take 1 tablet (800 mg total) by mouth every 8 (eight) hours as needed., Disp: 30 tablet, Rfl: 0 .  Nutritional Supplements (IMMUNE ENHANCE PO), Take by mouth., Disp: , Rfl:  .  omeprazole (PRILOSEC) 40 MG capsule, TAKE 1 CAPSULE BY MOUTH EVERY DAY, Disp: 90 capsule, Rfl: 1 .  Rimegepant Sulfate (NURTEC) 75 MG TBDP, Take 1 tablet by mouth every other day., Disp: 16 tablet, Rfl: 11 .  SUMAtriptan (IMITREX) 100 MG tablet, TAKE 1 TAB BY MOUTH EVERY 2 HOURS AS NEEDED MAY REPEAT IN 2 HOURS IF HEADACHE PERSISTS, Disp: 9 tablet, Rfl: 1 .  vitamin C (ASCORBIC ACID) 250 MG tablet, Take 500 mg by mouth daily., Disp: , Rfl:   Assessment/ Plan: 65 y.o. female   Trochanteric  bursitis of both hips - Plan: Ambulatory referral to Physical Therapy, ibuprofen (ADVIL) 800 MG tablet  Referral to physical therapy here in [place] placed.  She is status post treatment with oral NSAIDs, corticosteroid injection.  Bursitis was advanced imaging proven and physical therapy was interrupted secondary to cancer treatment.  I have renewed her ibuprofen.  Advised against other oral NSAIDs while taking.  Continue ice to the affected area.  Could consider repeat corticosteroid injection if needed  Start time: 12:32pm End time: 12:37pm  Total time spent on patient care (including telephone call/ virtual visit): 5 minutes  [place],  [place] [phone]

## 2020-06-06 NOTE — Progress Notes (Signed)
Patient called regarding card given and Financial Resources. Patient and spouse are patients of [facility].  Based on verbal income guidelines provided, they exceed the guidelines for the [employer]. Advised they may still complete the [employer] application whom assist patients who live in [place], She states they may look into it.  They have my card for any additional financial questions or concerns. I advised should any additional resources be available, I will reach out to them. She verbalized understanding.

## 2020-06-11 ENCOUNTER — Ambulatory Visit: Payer: 59 | Attending: Family Medicine | Admitting: Physical Therapy

## 2020-06-11 ENCOUNTER — Other Ambulatory Visit: Payer: Self-pay

## 2020-06-11 DIAGNOSIS — M25551 Pain in right hip: Secondary | ICD-10-CM | POA: Insufficient documentation

## 2020-06-11 DIAGNOSIS — M25552 Pain in left hip: Secondary | ICD-10-CM | POA: Diagnosis present

## 2020-06-11 NOTE — Therapy (Signed)
[facility] [place], [place], [address] Phone: [phone]   Fax:  [phone]  Physical Therapy Evaluation  Patient Details  Name: Carolyn Cooper MRN: [id number] Date of Birth: 1956/01/14 Referring Provider (PT): Ronnie Doss   Encounter Date: 06/11/2020   PT End of Session - 06/11/20 1447    Visit Number 1    Number of Visits 12    Date for PT Re-Evaluation 07/23/20    PT Start Time 0150    PT Stop Time 0215    PT Time Calculation (min) 25 min    Activity Tolerance Patient tolerated treatment well    Behavior During Therapy [facility] for tasks assessed/performed           Past Medical History:  Diagnosis Date  . Allergy    seasonal  . Anxiety   . Barrett's esophagus   . Breast cancer ([place]) 01/2020   left breast ILC  . Cataract    had surgery  . Depression   . Family history of breast cancer   . Family history of kidney cancer   . Family history of ovarian cancer   . Family history of stomach cancer   . GERD (gastroesophageal reflux disease)   . IBS (irritable bowel syndrome)   . Migraines   . Osteopenia 2019   T score -1.5 FRAX 8% / 0.7%    Past Surgical History:  Procedure Laterality Date  . BREAST LUMPECTOMY WITH RADIOACTIVE SEED AND SENTINEL LYMPH NODE BIOPSY Left 02/14/2020   Procedure: LEFT BREAST LUMPECTOMY WITH RADIOACTIVE SEED AND SENTINEL LYMPH NODE MAPPING;  Surgeon: Erroll Luna, MD;  Location: [facility];  Service: General;  Laterality: Left;  PECTORAL BLOCK  . CATARACT EXTRACTION, BILATERAL  2018  . CERVICAL CONE BIOPSY  1996  . COLONOSCOPY  07/21/2007  . CYSTOSCOPY    . MOUTH SURGERY  1978   WISDOM TEETH    There were no vitals filed for this visit.    Subjective Assessment - 06/11/20 1442    Subjective COVID-19 screen performed prior to patient entering clinic.  The patient returns to the clinic today with c/o bilateral hip pain. She was unable to continue PT  at that time (December 2021) as she was undergoing radiation treatments.  She is now on a chemo pill and is to remain on for 5 years. Her pain is a 4 today and can rise to higher levels with increased walking and attempting to sleep on her sides.    Pertinent History Patient was diagnosed on 01/02/2020 with left grade II invasive ductal carcinoma breast cancer. She underwent a left lumpectomy and sentinel node biopsy (1 negative node) on 02/14/2020. It is ER/PR positive and HER2 negative with a Ki67 of 10%.  H/o SIJ dysfucntion, osteopenia.    How long can you walk comfortably? Short community distances.    Patient Stated Goals To be able to walk for exercise.    Currently in Pain? Yes    Pain Score 4               [facility] PT Assessment - 06/11/20 0001      Assessment   Medical Diagnosis Trochnateric bursitis of both hips.    Referring Provider (PT) Ronnie Doss    Onset Date/Surgical Date --   2 years+.     Precautions   Precaution Comments No [place] or electrical stimulation.      Restrictions   Weight Bearing Restrictions No  [place] residence      Prior Function   Level of Independence Independent      Deep Tendon Reflexes   DTR Assessment Site Patella;Achilles    Patella DTR 2+    Achilles DTR 2+      AROM   Overall AROM Comments Normal bilateral hip range of motion though adduction is decreased somewhat.      Strength   Overall Strength Comments Normal bilateral hip strength.      Palpation   Palpation comment Tender to palpation over and around bilateral greater trochanters, TFL and glut med.      Ambulation/Gait   Gait Comments WNL.                      Objective measurements completed on examination: See above findings.                    PT Long Term Goals - 04/02/20 1453      PT LONG TERM GOAL #1   Title Independent with a HEP.    Time 4    Period Weeks    Status Achieved      PT  LONG TERM GOAL #2   Title Walk a community distance with bilateral hip pain-level not > 2-3/10.    Time 4    Period Weeks    Status On-going      PT LONG TERM GOAL #3   Title Sleep undisturbed 6 hours.    Time 4    Period Weeks    Status On-going                  Plan - 06/11/20 1452    Clinical Impression Statement The patient presents to OPPT with c/o bilateral hip pain increasing with walking and disturbed sleep when attempting to lie on her hips.  Her bilateral hip range of motion is essentially normal with the exception of adduction which is dcereased somewhat.  Her hip strength is normal.  She is tender bilaterally over and around her greater trochanters, TFL and glut meds. Patient will benefit from skilled physical therapy intervention to address pain and deficits.    Personal Factors and Comorbidities Comorbidity 1;Comorbidity 2    Comorbidities Patient was diagnosed on 01/02/2020 with left grade II invasive ductal carcinoma breast cancer. She underwent a left lumpectomy and sentinel node biopsy (1 negative node) on 02/14/2020. It is ER/PR positive and HER2 negative with a Ki67 of 10%.  On chemo pill.   H/o SIJ dysfucntion, osteopenia.    Examination-Activity Limitations Other;Sleep    Examination-Participation Restrictions Other    Stability/Clinical Decision Making Evolving/Moderate complexity    Rehab Potential Excellent    PT Frequency 2x / week    PT Duration 4 weeks    PT Treatment/Interventions Moist Heat;Cryotherapy;Therapeutic activities;Therapeutic exercise;Manual techniques;Passive range of motion    PT Next Visit Plan Nustep with progression to treadmill.  STW/M to bilateral hip musculature.  Lateral hip musculature stretching.  SDLY hip abduction.           Patient will benefit from skilled therapeutic intervention in order to improve the following deficits and impairments:  Pain,Decreased activity tolerance,Decreased range of motion,Increased muscle  spasms  Visit Diagnosis: Pain in right hip - Plan: PT plan of care cert/re-cert  Pain in left hip - Plan: PT plan of care cert/re-cert     Problem List Patient Active Problem List   Diagnosis  Date Noted  . Genetic testing 02/13/2020  . Family history of ovarian cancer   . Family history of breast cancer   . Family history of stomach cancer   . Family history of kidney cancer   . Malignant neoplasm of upper-outer quadrant of left breast in female, estrogen receptor positive ([place]) 01/30/2020  . Bursitis of both hips 08/01/2018  . Anxiety 10/17/2017  . Weight gain 10/17/2017  . Microscopic hematuria 09/13/2015  . Vitamin D deficiency 09/11/2015  . Knee MCL sprain 04/04/2015  . Nonallopathic lesion of sacral region 01/10/2015  . Nonallopathic lesion of lumbosacral region 01/10/2015  . Nonallopathic lesion of thoracic region 01/10/2015  . SI (sacroiliac) joint dysfunction 12/19/2014  . Osteopenia 01/30/2014  . Migraine headache 01/30/2014    Savaya Hakes, [place]  MPT 06/11/2020, 2:59 PM  [facility] [address] [place], [place], [address] Phone: [phone]   Fax:  [phone]  Name: Khalia Gong MRN: [id number] Date of Birth: 07/16/1955

## 2020-06-16 ENCOUNTER — Ambulatory Visit: Payer: 59 | Admitting: Physical Therapy

## 2020-06-18 ENCOUNTER — Telehealth: Payer: Self-pay

## 2020-06-18 NOTE — Telephone Encounter (Signed)
Called left message that it is okay to do at appt time - note made on appt

## 2020-06-19 ENCOUNTER — Ambulatory Visit: Payer: 59 | Admitting: Physical Therapy

## 2020-06-19 ENCOUNTER — Encounter: Payer: Self-pay | Admitting: Physical Therapy

## 2020-06-19 ENCOUNTER — Other Ambulatory Visit: Payer: Self-pay

## 2020-06-19 DIAGNOSIS — M25551 Pain in right hip: Secondary | ICD-10-CM | POA: Diagnosis not present

## 2020-06-19 DIAGNOSIS — M25552 Pain in left hip: Secondary | ICD-10-CM

## 2020-06-19 NOTE — Therapy (Signed)
[facility] [place], [place], [address] Phone: [phone]   Fax:  [phone]  Physical Therapy Treatment  Patient Details  Name: Carolyn Cooper MRN: [id number] Date of Birth: [birth year] Referring Provider (PT): Ronnie Doss   Encounter Date: 06/19/2020   PT End of Session - 06/19/20 0949    Visit Number 2    Number of Visits 12    Date for PT Re-Evaluation 07/23/20    PT Start Time 0946    PT Stop Time 1030    PT Time Calculation (min) 44 min    Activity Tolerance Patient tolerated treatment well    Behavior During Therapy [facility] for tasks assessed/performed           Past Medical History:  Diagnosis Date  . Allergy    seasonal  . Anxiety   . Barrett's esophagus   . Breast cancer ([facility]) 01/2020   left breast ILC  . Cataract    had surgery  . Depression   . Family history of breast cancer   . Family history of kidney cancer   . Family history of ovarian cancer   . Family history of stomach cancer   . GERD (gastroesophageal reflux disease)   . IBS (irritable bowel syndrome)   . Migraines   . Osteopenia 2019   T score -1.5 FRAX 8% / 0.7%    Past Surgical History:  Procedure Laterality Date  . BREAST LUMPECTOMY WITH RADIOACTIVE SEED AND SENTINEL LYMPH NODE BIOPSY Left 02/14/2020   Procedure: LEFT BREAST LUMPECTOMY WITH RADIOACTIVE SEED AND SENTINEL LYMPH NODE MAPPING;  Surgeon: Erroll Luna, MD;  Location: [facility];  Service: General;  Laterality: Left;  PECTORAL BLOCK  . CATARACT EXTRACTION, BILATERAL  2018  . CERVICAL CONE BIOPSY  1996  . COLONOSCOPY  07/21/2007  . CYSTOSCOPY    . MOUTH SURGERY  1978   WISDOM TEETH    There were no vitals filed for this visit.   Subjective Assessment - 06/19/20 0947    Subjective COVID-19 screen performed prior to patient entering clinic. Reports soreness with palpation of B hips.    Pertinent History Patient was diagnosed on 01/02/2020  with left grade II invasive ductal carcinoma breast cancer. She underwent a left lumpectomy and sentinel node biopsy (1 negative node) on 02/14/2020. It is ER/PR positive and HER2 negative with a Ki67 of 10%.  H/o SIJ dysfucntion, osteopenia.    How long can you walk comfortably? Short community distances.    Patient Stated Goals To be able to walk for exercise.    Currently in Pain? Yes    Pain Score 6     Pain Location Hip    Pain Orientation Left;Right    Pain Descriptors / Indicators Sore    Pain Type Chronic pain    Pain Onset More than a month ago    Pain Frequency Intermittent              [facility] PT Assessment - 06/19/20 0001      Assessment   Medical Diagnosis Trochnateric bursitis of both hips.    Referring Provider (PT) Ronnie Doss    Onset Date/Surgical Date 02/14/20    Hand Dominance Left    Prior Therapy Baselines      Precautions   Precautions Other (comment)    Precaution Comments No [place] or electrical stimulation.      Restrictions   Weight Bearing Restrictions No  [facility] Adult PT Treatment/Exercise - 06/19/20 0001      Knee/Hip Exercises: Stretches   Passive Hamstring Stretch Both;3 reps;30 seconds    ITB Stretch Both;3 reps;30 seconds      Knee/Hip Exercises: Aerobic   Recumbent Bike L3 x10 min, seat 7      Knee/Hip Exercises: Standing   Hip Abduction AROM;Both;2 sets;10 reps;Knee bent;Knee straight   x20 reps each   Hip Extension AROM;Both;2 sets;10 reps;Knee bent      Knee/Hip Exercises: Supine   Bridges Strengthening;Both;15 reps    Straight Leg Raises Strengthening;Both;15 reps      Manual Therapy   Manual Therapy Soft tissue mobilization    Soft tissue mobilization STW to B ITB, lateral quad and HS to reduce TPs and tone                       PT Long Term Goals - 04/02/20 1453      PT LONG TERM GOAL #1   Title Independent with a HEP.    Time 4    Period Weeks    Status Achieved       PT LONG TERM GOAL #2   Title Walk a community distance with bilateral hip pain-level not > 2-3/10.    Time 4    Period Weeks    Status On-going      PT LONG TERM GOAL #3   Title Sleep undisturbed 6 hours.    Time 4    Period Weeks    Status On-going                 Plan - 06/19/20 1033    Clinical Impression Statement Patient presented in clinic with continued hip soreness. More inflammation palpable around L greater trochanter but greater tone palpable in R ITB, quad and HS. Patient able to tolerate light therex fairly well although that experiencing muscle burn. TPs palpable throughout B ITB, quad, and HS.    Personal Factors and Comorbidities Comorbidity 1;Comorbidity 2    Comorbidities Patient was diagnosed on 01/02/2020 with left grade II invasive ductal carcinoma breast cancer. She underwent a left lumpectomy and sentinel node biopsy (1 negative node) on 02/14/2020. It is ER/PR positive and HER2 negative with a Ki67 of 10%.  On chemo pill.   H/o SIJ dysfucntion, osteopenia.    Examination-Activity Limitations Other;Sleep    Examination-Participation Restrictions Other    Stability/Clinical Decision Making Evolving/Moderate complexity    Rehab Potential Excellent    PT Frequency 2x / week    PT Duration 4 weeks    PT Treatment/Interventions Moist Heat;Cryotherapy;Therapeutic activities;Therapeutic exercise;Manual techniques;Passive range of motion    PT Next Visit Plan Nustep with progression to treadmill.  STW/M to bilateral hip musculature.  Lateral hip musculature stretching.  SDLY hip abduction.    PT Home Exercise Plan Post op shoulder ROM HEP    Consulted and Agree with Plan of Care Patient           Patient will benefit from skilled therapeutic intervention in order to improve the following deficits and impairments:  Pain,Decreased activity tolerance,Decreased range of motion,Increased muscle spasms  Visit Diagnosis: Pain in right hip  Pain in left  hip     Problem List Patient Active Problem List   Diagnosis Date Noted  . Genetic testing 02/13/2020  . Family history of ovarian cancer   . Family history of breast cancer   . Family history of stomach cancer   . Family  history of kidney cancer   . Malignant neoplasm of upper-outer quadrant of left breast in female, estrogen receptor positive ([facility]) 01/30/2020  . Bursitis of both hips 08/01/2018  . Anxiety 10/17/2017  . Weight gain 10/17/2017  . Microscopic hematuria 09/13/2015  . Vitamin D deficiency 09/11/2015  . Knee MCL sprain 04/04/2015  . Nonallopathic lesion of sacral region 01/10/2015  . Nonallopathic lesion of lumbosacral region 01/10/2015  . Nonallopathic lesion of thoracic region 01/10/2015  . SI (sacroiliac) joint dysfunction 12/19/2014  . Osteopenia 01/30/2014  . Migraine headache 01/30/2014    Standley Brooking, PTA 06/19/2020, 11:30 AM  [facility] [address] [place], [place], [address] Phone: [phone]   Fax:  [phone]  Name: Carolyn Cooper MRN: [id number] Date of Birth: [birth year]

## 2020-06-24 ENCOUNTER — Encounter: Payer: Self-pay | Admitting: Physical Therapy

## 2020-06-24 ENCOUNTER — Other Ambulatory Visit: Payer: Self-pay

## 2020-06-24 ENCOUNTER — Ambulatory Visit: Payer: 59 | Admitting: Physical Therapy

## 2020-06-24 DIAGNOSIS — M25552 Pain in left hip: Secondary | ICD-10-CM

## 2020-06-24 DIAGNOSIS — M25551 Pain in right hip: Secondary | ICD-10-CM | POA: Diagnosis not present

## 2020-06-24 NOTE — Therapy (Signed)
[facility] [place], [place], [address] Phone: [phone]   Fax:  [phone]  Physical Therapy Treatment  Patient Details  Name: Carolyn Cooper MRN: [id number] Date of Birth: [birth year] Referring Provider (PT): Ronnie Doss   Encounter Date: 06/24/2020   PT End of Session - 06/24/20 1610    Visit Number 3    Number of Visits 12    Date for PT Re-Evaluation 07/23/20    PT Start Time 8841    PT Stop Time 1600    PT Time Calculation (min) 44 min    Activity Tolerance Patient tolerated treatment well    Behavior During Therapy [facility] for tasks assessed/performed           Past Medical History:  Diagnosis Date  . Allergy    seasonal  . Anxiety   . Barrett's esophagus   . Breast cancer ([facility]) 01/2020   left breast ILC  . Cataract    had surgery  . Depression   . Family history of breast cancer   . Family history of kidney cancer   . Family history of ovarian cancer   . Family history of stomach cancer   . GERD (gastroesophageal reflux disease)   . IBS (irritable bowel syndrome)   . Migraines   . Osteopenia 2019   T score -1.5 FRAX 8% / 0.7%    Past Surgical History:  Procedure Laterality Date  . BREAST LUMPECTOMY WITH RADIOACTIVE SEED AND SENTINEL LYMPH NODE BIOPSY Left 02/14/2020   Procedure: LEFT BREAST LUMPECTOMY WITH RADIOACTIVE SEED AND SENTINEL LYMPH NODE MAPPING;  Surgeon: Erroll Luna, MD;  Location: [facility];  Service: General;  Laterality: Left;  PECTORAL BLOCK  . CATARACT EXTRACTION, BILATERAL  2018  . CERVICAL CONE BIOPSY  1996  . COLONOSCOPY  07/21/2007  . CYSTOSCOPY    . MOUTH SURGERY  1978   WISDOM TEETH    There were no vitals filed for this visit.   Subjective Assessment - 06/24/20 1526    Subjective COVID-19 screen performed prior to patient entering clinic. Reports less soreness to palpation.    Pertinent History Patient was diagnosed on 01/02/2020 with left  grade II invasive ductal carcinoma breast cancer. She underwent a left lumpectomy and sentinel node biopsy (1 negative node) on 02/14/2020. It is ER/PR positive and HER2 negative with a Ki67 of 10%.  H/o SIJ dysfucntion, osteopenia.    How long can you walk comfortably? Short community distances.    Patient Stated Goals To be able to walk for exercise.    Currently in Pain? Other (Comment)   No pain assessment provided             [facility] PT Assessment - 06/24/20 0001      Assessment   Medical Diagnosis Trochnateric bursitis of both hips.    Referring Provider (PT) Ronnie Doss    Onset Date/Surgical Date 02/14/20    Hand Dominance Left    Prior Therapy Baselines      Precautions   Precautions Other (comment)    Precaution Comments No [place] or electrical stimulation.      Restrictions   Weight Bearing Restrictions No                         [facility] Adult PT Treatment/Exercise - 06/24/20 0001      Exercises   Exercises Knee/Hip      Knee/Hip Exercises: Aerobic   Recumbent Bike  L3 x10 min, seat 7      Knee/Hip Exercises: Standing   Hip Abduction AROM;Both;15 reps;Knee bent;Knee straight    Rocker Board 5 minutes      Manual Therapy   Manual Therapy Soft tissue mobilization    Soft tissue mobilization STW to B ITB, lateral quad and HS to reduce TPs and tone                              Plan - 06/24/20 1611    Clinical Impression Statement Patient presented in clinic with no new complaints. Patient able to tolerate therex fairly well other than muscle fatigue. Less soreness palpable during manual therapy to B ITBs, quad and HS. Patient presentes with greater TPs and tightness of R ITB, HS and quad.    Personal Factors and Comorbidities Comorbidity 1;Comorbidity 2    Comorbidities Patient was diagnosed on 01/02/2020 with left grade II invasive ductal carcinoma breast cancer. She underwent a left lumpectomy and sentinel node biopsy (1 negative  node) on 02/14/2020. It is ER/PR positive and HER2 negative with a Ki67 of 10%.  On chemo pill.   H/o SIJ dysfucntion, osteopenia.    Examination-Activity Limitations Other;Sleep    Examination-Participation Restrictions Other    Stability/Clinical Decision Making Evolving/Moderate complexity    Rehab Potential Excellent    PT Frequency 2x / week    PT Duration 4 weeks    PT Treatment/Interventions Moist Heat;Cryotherapy;Therapeutic activities;Therapeutic exercise;Manual techniques;Passive range of motion    PT Next Visit Plan Nustep with progression to treadmill.  STW/M to bilateral hip musculature.  Lateral hip musculature stretching.  SDLY hip abduction.    PT Home Exercise Plan Post op shoulder ROM HEP    Consulted and Agree with Plan of Care Patient           Patient will benefit from skilled therapeutic intervention in order to improve the following deficits and impairments:  Pain,Decreased activity tolerance,Decreased range of motion,Increased muscle spasms  Visit Diagnosis: Pain in right hip  Pain in left hip     Problem List Patient Active Problem List   Diagnosis Date Noted  . Genetic testing 02/13/2020  . Family history of ovarian cancer   . Family history of breast cancer   . Family history of stomach cancer   . Family history of kidney cancer   . Malignant neoplasm of upper-outer quadrant of left breast in female, estrogen receptor positive ([facility]) 01/30/2020  . Bursitis of both hips 08/01/2018  . Anxiety 10/17/2017  . Weight gain 10/17/2017  . Microscopic hematuria 09/13/2015  . Vitamin D deficiency 09/11/2015  . Knee MCL sprain 04/04/2015  . Nonallopathic lesion of sacral region 01/10/2015  . Nonallopathic lesion of lumbosacral region 01/10/2015  . Nonallopathic lesion of thoracic region 01/10/2015  . SI (sacroiliac) joint dysfunction 12/19/2014  . Osteopenia 01/30/2014  . Migraine headache 01/30/2014    Kelsey P Kennon, PTA 06/24/2020, 4:16 PM  [facility] [address] [place], [place], [address] Phone: [phone]   Fax:  [phone]  Name: Carolyn Cooper MRN: [id number] Date of Birth: 03/23/1956   

## 2020-06-25 ENCOUNTER — Other Ambulatory Visit: Payer: Self-pay | Admitting: Family Medicine

## 2020-06-25 DIAGNOSIS — G43809 Other migraine, not intractable, without status migrainosus: Secondary | ICD-10-CM

## 2020-06-26 ENCOUNTER — Encounter: Payer: 59 | Admitting: Physical Therapy

## 2020-07-01 ENCOUNTER — Encounter: Payer: Self-pay | Admitting: Physical Therapy

## 2020-07-01 ENCOUNTER — Other Ambulatory Visit: Payer: Self-pay

## 2020-07-01 ENCOUNTER — Ambulatory Visit: Payer: 59 | Admitting: Physical Therapy

## 2020-07-01 DIAGNOSIS — M25551 Pain in right hip: Secondary | ICD-10-CM

## 2020-07-01 DIAGNOSIS — M25552 Pain in left hip: Secondary | ICD-10-CM

## 2020-07-01 NOTE — Therapy (Signed)
[facility] [place], [place], [address] Phone: [phone]   Fax:  [phone]  Physical Therapy Treatment  Patient Details  Name: Carolyn Cooper MRN: [id number] Date of Birth: 1955/09/26 Referring Provider (PT): Ronnie Doss   Encounter Date: 07/01/2020   PT End of Session - 07/01/20 1601    Visit Number 4    Number of Visits 12    Date for PT Re-Evaluation 07/23/20    PT Start Time 7035    PT Stop Time 1559    PT Time Calculation (min) 43 min    Activity Tolerance Patient tolerated treatment well    Behavior During Therapy [facility] for tasks assessed/performed           Past Medical History:  Diagnosis Date  . Allergy    seasonal  . Anxiety   . Barrett's esophagus   . Breast cancer ([place]) 01/2020   left breast ILC  . Cataract    had surgery  . Depression   . Family history of breast cancer   . Family history of kidney cancer   . Family history of ovarian cancer   . Family history of stomach cancer   . GERD (gastroesophageal reflux disease)   . IBS (irritable bowel syndrome)   . Migraines   . Osteopenia 2019   T score -1.5 FRAX 8% / 0.7%    Past Surgical History:  Procedure Laterality Date  . BREAST LUMPECTOMY WITH RADIOACTIVE SEED AND SENTINEL LYMPH NODE BIOPSY Left 02/14/2020   Procedure: LEFT BREAST LUMPECTOMY WITH RADIOACTIVE SEED AND SENTINEL LYMPH NODE MAPPING;  Surgeon: Erroll Luna, MD;  Location: [facility];  Service: General;  Laterality: Left;  PECTORAL BLOCK  . CATARACT EXTRACTION, BILATERAL  2018  . CERVICAL CONE BIOPSY  1996  . COLONOSCOPY  07/21/2007  . CYSTOSCOPY    . MOUTH SURGERY  1978   WISDOM TEETH    There were no vitals filed for this visit.   Subjective Assessment - 07/01/20 1527    Subjective COVID-19 screen performed prior to patient entering clinic. No pain.    Pertinent History Patient was diagnosed on 01/02/2020 with left grade II invasive ductal  carcinoma breast cancer. She underwent a left lumpectomy and sentinel node biopsy (1 negative node) on 02/14/2020. It is ER/PR positive and HER2 negative with a Ki67 of 10%.  H/o SIJ dysfucntion, osteopenia.    How long can you walk comfortably? Short community distances.    Patient Stated Goals To be able to walk for exercise.    Currently in Pain? No/denies              [facility] PT Assessment - 07/01/20 0001      Assessment   Medical Diagnosis Trochnateric bursitis of both hips.    Referring Provider (PT) Ronnie Doss    Onset Date/Surgical Date 02/14/20    Hand Dominance Left    Prior Therapy Baselines      Precautions   Precautions Other (comment)    Precaution Comments No [place] or electrical stimulation.      Restrictions   Weight Bearing Restrictions No                         [facility] Adult PT Treatment/Exercise - 07/01/20 0001      Knee/Hip Exercises: Aerobic   Recumbent Bike L3 x10 min, seat 5      Knee/Hip Exercises: Supine   Bridges with Clamshell Strengthening;Both;15  reps   red theraband   Straight Leg Raises Strengthening;Both;15 reps      Knee/Hip Exercises: Sidelying   Hip ABduction AROM;Both;15 reps    Clams B hip clam red theraband x15 reps each      Manual Therapy   Manual Therapy Soft tissue mobilization    Soft tissue mobilization STW to B ITB, lateral quad and HS to reduce TPs and tone                              Plan - 07/01/20 1608    Clinical Impression Statement Patient presented in clinic with reports of no current hip pain. Patient reports experiencing less burning while walking and not waking now due to hip pain (waking due to shoulder pain.) Patient able to tolerate treatment for hip strengthening well without complaint of pain. Patient still experiencing some soreness with TPR intermittantly but less sensitivity. Mod amount of TPs noted throughout B quad, ITB, lateral HS.    Personal Factors and  Comorbidities Comorbidity 1;Comorbidity 2    Comorbidities Patient was diagnosed on 01/02/2020 with left grade II invasive ductal carcinoma breast cancer. She underwent a left lumpectomy and sentinel node biopsy (1 negative node) on 02/14/2020. It is ER/PR positive and HER2 negative with a Ki67 of 10%.  On chemo pill.   H/o SIJ dysfucntion, osteopenia.    Examination-Activity Limitations Other;Sleep    Examination-Participation Restrictions Other    Stability/Clinical Decision Making Evolving/Moderate complexity    Rehab Potential Excellent    PT Frequency 2x / week    PT Duration 4 weeks    PT Treatment/Interventions Moist Heat;Cryotherapy;Therapeutic activities;Therapeutic exercise;Manual techniques;Passive range of motion    PT Next Visit Plan Nustep with progression to treadmill.  STW/M to bilateral hip musculature.  Lateral hip musculature stretching.  SDLY hip abduction.    PT Home Exercise Plan Post op shoulder ROM HEP    Consulted and Agree with Plan of Care Patient           Patient will benefit from skilled therapeutic intervention in order to improve the following deficits and impairments:  Pain,Decreased activity tolerance,Decreased range of motion,Increased muscle spasms  Visit Diagnosis: Pain in right hip  Pain in left hip     Problem List Patient Active Problem List   Diagnosis Date Noted  . Genetic testing 02/13/2020  . Family history of ovarian cancer   . Family history of breast cancer   . Family history of stomach cancer   . Family history of kidney cancer   . Malignant neoplasm of upper-outer quadrant of left breast in female, estrogen receptor positive ([place]) 01/30/2020  . Bursitis of both hips 08/01/2018  . Anxiety 10/17/2017  . Weight gain 10/17/2017  . Microscopic hematuria 09/13/2015  . Vitamin D deficiency 09/11/2015  . Knee MCL sprain 04/04/2015  . Nonallopathic lesion of sacral region 01/10/2015  . Nonallopathic lesion of lumbosacral region  01/10/2015  . Nonallopathic lesion of thoracic region 01/10/2015  . SI (sacroiliac) joint dysfunction 12/19/2014  . Osteopenia 01/30/2014  . Migraine headache 01/30/2014    Standley Brooking, PTA 07/01/2020, 4:13 PM  [facility] [place], [place], [address] Phone: [phone]   Fax:  [phone]  Name: Tayah Idrovo MRN: [id number] Date of Birth: March 06, 1956

## 2020-07-03 ENCOUNTER — Encounter: Payer: Self-pay | Admitting: Physical Therapy

## 2020-07-03 ENCOUNTER — Ambulatory Visit: Payer: 59 | Admitting: Physical Therapy

## 2020-07-03 ENCOUNTER — Other Ambulatory Visit: Payer: Self-pay

## 2020-07-03 DIAGNOSIS — M25551 Pain in right hip: Secondary | ICD-10-CM | POA: Diagnosis not present

## 2020-07-03 DIAGNOSIS — M25552 Pain in left hip: Secondary | ICD-10-CM

## 2020-07-03 NOTE — Therapy (Signed)
[facility] [place], [place], [address] Phone: [phone]   Fax:  [phone]  Physical Therapy Treatment  Patient Details  Name: Diamond Jentz MRN: [id number] Date of Birth: 05/16/1955 Referring Provider (PT): Ronnie Doss   Encounter Date: 07/03/2020   PT End of Session - 07/03/20 1530    Visit Number 5    Number of Visits 12    Date for PT Re-Evaluation 07/23/20    PT Start Time 1520    PT Stop Time 1604    PT Time Calculation (min) 44 min    Activity Tolerance Patient tolerated treatment well    Behavior During Therapy [facility] for tasks assessed/performed           Past Medical History:  Diagnosis Date  . Allergy    seasonal  . Anxiety   . Barrett's esophagus   . Breast cancer ([place]) 01/2020   left breast ILC  . Cataract    had surgery  . Depression   . Family history of breast cancer   . Family history of kidney cancer   . Family history of ovarian cancer   . Family history of stomach cancer   . GERD (gastroesophageal reflux disease)   . IBS (irritable bowel syndrome)   . Migraines   . Osteopenia 2019   T score -1.5 FRAX 8% / 0.7%    Past Surgical History:  Procedure Laterality Date  . BREAST LUMPECTOMY WITH RADIOACTIVE SEED AND SENTINEL LYMPH NODE BIOPSY Left 02/14/2020   Procedure: LEFT BREAST LUMPECTOMY WITH RADIOACTIVE SEED AND SENTINEL LYMPH NODE MAPPING;  Surgeon: Erroll Luna, MD;  Location: [facility];  Service: General;  Laterality: Left;  PECTORAL BLOCK  . CATARACT EXTRACTION, BILATERAL  2018  . CERVICAL CONE BIOPSY  1996  . COLONOSCOPY  07/21/2007  . CYSTOSCOPY    . MOUTH SURGERY  1978   WISDOM TEETH    There were no vitals filed for this visit.   Subjective Assessment - 07/03/20 1527    Subjective COVID-19 screen performed prior to patient entering clinic. Used massage tool on L ITB and probably made it more sore.    Pertinent History Patient was diagnosed  on 01/02/2020 with left grade II invasive ductal carcinoma breast cancer. She underwent a left lumpectomy and sentinel node biopsy (1 negative node) on 02/14/2020. It is ER/PR positive and HER2 negative with a Ki67 of 10%.  H/o SIJ dysfucntion, osteopenia.    How long can you walk comfortably? Short community distances.    Patient Stated Goals To be able to walk for exercise.    Currently in Pain? No/denies              [facility] PT Assessment - 07/03/20 0001      Assessment   Medical Diagnosis Trochnateric bursitis of both hips.    Referring Provider (PT) Ronnie Doss    Onset Date/Surgical Date 02/14/20    Hand Dominance Left    Prior Therapy Baselines      Precautions   Precautions Other (comment)    Precaution Comments No [place] or electrical stimulation.      Restrictions   Weight Bearing Restrictions No                         [facility] Adult PT Treatment/Exercise - 07/03/20 0001      Knee/Hip Exercises: Aerobic   Recumbent Bike L3 x10 min, seat 5  Knee/Hip Exercises: Standing   Hip Abduction Stengthening;Both;2 sets;10 reps;Knee bent;Knee straight    Hip Extension Stengthening;Both;2 sets;10 reps;Knee bent    Rocker Board 4 minutes      Manual Therapy   Manual Therapy Soft tissue mobilization    Soft tissue mobilization STW to B ITB, lateral quad and HS to reduce TPs and tone                              Plan - 07/03/20 1726    Clinical Impression Statement Patient presented in clinic with only reports of recent soreness after using a massage tool to L ITB. Patient able to tolerate therex fairly well although reported pain with SLS. TPs felt intermittantly throughout B ITB, lateral quad region. No reports of soreness during manual therapy or following treatment.    Personal Factors and Comorbidities Comorbidity 1;Comorbidity 2    Comorbidities Patient was diagnosed on 01/02/2020 with left grade II invasive ductal carcinoma breast  cancer. She underwent a left lumpectomy and sentinel node biopsy (1 negative node) on 02/14/2020. It is ER/PR positive and HER2 negative with a Ki67 of 10%.  On chemo pill.   H/o SIJ dysfucntion, osteopenia.    Examination-Activity Limitations Other;Sleep    Examination-Participation Restrictions Other    Stability/Clinical Decision Making Evolving/Moderate complexity    Rehab Potential Excellent    PT Frequency 2x / week    PT Duration 4 weeks    PT Treatment/Interventions Moist Heat;Cryotherapy;Therapeutic activities;Therapeutic exercise;Manual techniques;Passive range of motion    PT Next Visit Plan Nustep with progression to treadmill.  STW/M to bilateral hip musculature.  Lateral hip musculature stretching.  SDLY hip abduction.    PT Home Exercise Plan Post op shoulder ROM HEP    Consulted and Agree with Plan of Care Patient           Patient will benefit from skilled therapeutic intervention in order to improve the following deficits and impairments:  Pain,Decreased activity tolerance,Decreased range of motion,Increased muscle spasms  Visit Diagnosis: Pain in right hip  Pain in left hip     Problem List Patient Active Problem List   Diagnosis Date Noted  . Genetic testing 02/13/2020  . Family history of ovarian cancer   . Family history of breast cancer   . Family history of stomach cancer   . Family history of kidney cancer   . Malignant neoplasm of upper-outer quadrant of left breast in female, estrogen receptor positive ([place]) 01/30/2020  . Bursitis of both hips 08/01/2018  . Anxiety 10/17/2017  . Weight gain 10/17/2017  . Microscopic hematuria 09/13/2015  . Vitamin D deficiency 09/11/2015  . Knee MCL sprain 04/04/2015  . Nonallopathic lesion of sacral region 01/10/2015  . Nonallopathic lesion of lumbosacral region 01/10/2015  . Nonallopathic lesion of thoracic region 01/10/2015  . SI (sacroiliac) joint dysfunction 12/19/2014  . Osteopenia 01/30/2014  . Migraine  headache 01/30/2014    Standley Brooking, PTA 07/03/2020, 5:29 PM  [facility] [address] [place], [place], [address] Phone: [phone]   Fax:  [phone]  Name: Anju Sereno MRN: [id number] Date of Birth: 03/22/1956

## 2020-07-07 ENCOUNTER — Ambulatory Visit (INDEPENDENT_AMBULATORY_CARE_PROVIDER_SITE_OTHER): Payer: 59

## 2020-07-07 ENCOUNTER — Other Ambulatory Visit: Payer: Self-pay

## 2020-07-07 ENCOUNTER — Encounter: Payer: Self-pay | Admitting: Family Medicine

## 2020-07-07 ENCOUNTER — Ambulatory Visit (INDEPENDENT_AMBULATORY_CARE_PROVIDER_SITE_OTHER): Payer: 59 | Admitting: Family Medicine

## 2020-07-07 VITALS — BP 115/71 | HR 71 | Temp 98.0°F | Ht 65.5 in | Wt 182.2 lb

## 2020-07-07 DIAGNOSIS — C50412 Malignant neoplasm of upper-outer quadrant of left female breast: Secondary | ICD-10-CM | POA: Diagnosis not present

## 2020-07-07 DIAGNOSIS — K580 Irritable bowel syndrome with diarrhea: Secondary | ICD-10-CM | POA: Diagnosis not present

## 2020-07-07 DIAGNOSIS — Z780 Asymptomatic menopausal state: Secondary | ICD-10-CM | POA: Diagnosis not present

## 2020-07-07 DIAGNOSIS — Z170 Estrogen receptor positive status [ER+]: Secondary | ICD-10-CM

## 2020-07-07 DIAGNOSIS — G43709 Chronic migraine without aura, not intractable, without status migrainosus: Secondary | ICD-10-CM

## 2020-07-07 MED ORDER — RIFAXIMIN 550 MG PO TABS: 550.0000 mg | ORAL_TABLET | Freq: Three times a day (TID) | ORAL | 0 refills | Status: AC

## 2020-07-07 MED ORDER — SUMATRIPTAN SUCCINATE 100 MG PO TABS: ORAL_TABLET | ORAL | 12 refills | Status: DC

## 2020-07-07 NOTE — Patient Instructions (Signed)
Schedule with GI.  I am placing you on Xifaxan in efforts to relieve your IBS-D.  Will research more into the new migraine medication.

## 2020-07-07 NOTE — Progress Notes (Signed)
Subjective: CC: Headaches PCP: Carolyn Norlander, DO DJS:HFWYO Darothy Cooper is a 65 y.o. female presenting to clinic today for:  1.  Migraine headaches Patient reports that she is having about 4 migraine headaches per week.  Imitrex does seem to resolve she "has enough tablets to last her throughout the month".  She is compliant with Nurtec every other day for prevention and this was initially very helpful but seems not to be working as well.  She saw an ad for [facility] and wants to know more about this.  2.  IBS-D Patient with increasing frequency of diarrhea.  She often will have associated cramping.  She is taken hyoscyamine this has not been especially helpful.  She has been on probiotics previously as well.  No reports of rectal bleeding.  She does become nauseated and has vomited as a result of the IBS-D.  3.  Breast cancer Patient is currently treated with Arimidex.  She follows with Dr. Jana Hakim and has an upcoming appointment.  She needs to get her DEXA scan prior to this appointment.  Diagnostic mammogram planned soon.   ROS: Per HPI  Allergies  Allergen Reactions  . Sulfa Antibiotics Hives   Past Medical History:  Diagnosis Date  . Allergy    seasonal  . Anxiety   . Barrett's esophagus   . Breast cancer ([place]) 01/2020   left breast ILC  . Cataract    had surgery  . Depression   . Family history of breast cancer   . Family history of kidney cancer   . Family history of ovarian cancer   . Family history of stomach cancer   . GERD (gastroesophageal reflux disease)   . IBS (irritable bowel syndrome)   . Migraines   . Osteopenia 2019   T score -1.5 FRAX 8% / 0.7%    Current Outpatient Medications:  .  anastrozole (ARIMIDEX) 1 MG tablet, Take 1 tablet (1 mg total) by mouth daily., Disp: 90 tablet, Rfl: 4 .  calcium carbonate (OS-CAL) 600 MG TABS, Take 600 mg by mouth 2 (two) times daily with a meal., Disp: , Rfl:  .  Cholecalciferol (VITAMIN D) 400 UNITS  capsule, Take 800 Units by mouth daily., Disp: , Rfl:  .  escitalopram (LEXAPRO) 20 MG tablet, Take 1 tablet (20 mg total) by mouth daily., Disp: 90 tablet, Rfl: 3 .  hyoscyamine (LEVSIN SL) 0.125 MG SL tablet, TAKE I CAPSULE BY MOUTH DAILY AS NEEDED, Disp: 30 tablet, Rfl: 1 .  ibuprofen (ADVIL) 800 MG tablet, Take 1 tablet (800 mg total) by mouth every 8 (eight) hours as needed for moderate pain., Disp: 90 tablet, Rfl: 3 .  omeprazole (PRILOSEC) 40 MG capsule, TAKE 1 CAPSULE BY MOUTH EVERY DAY, Disp: 90 capsule, Rfl: 1 .  Rimegepant Sulfate (NURTEC) 75 MG TBDP, Take 1 tablet by mouth every other day., Disp: 16 tablet, Rfl: 11 .  SUMAtriptan (IMITREX) 100 MG tablet, TAKE 1 TABLET BY MOUTH EVERY 2 HOURS AS NEEDED MAY REPEAT IN 2 HOURS IF HEADACHE PERSISTS, Disp: 9 tablet, Rfl: 1 .  vitamin C (ASCORBIC ACID) 250 MG tablet, Take 500 mg by mouth daily., Disp: , Rfl:  .  Nutritional Supplements (IMMUNE ENHANCE PO), Take by mouth. (Patient not taking: Reported on 07/07/2020), Disp: , Rfl:  Social History   Socioeconomic History  . Marital status: Married    Spouse name: Not on file  . Number of children: Not on file  . Years of education: Not  on file  . Highest education level: Not on file  Occupational History  . Not on file  Tobacco Use  . Smoking status: Never Smoker  . Smokeless tobacco: Never Used  Vaping Use  . Vaping Use: Never used  Substance and Sexual Activity  . Alcohol use: Not Currently    Alcohol/week: 0.0 standard drinks  . Drug use: No  . Sexual activity: Yes    Birth control/protection: Post-menopausal    Comment: 1st intercourse 65 yo-Fewer than 5 partners  Other Topics Concern  . Not on file  Social History Narrative  . Not on file   Social Determinants of Health   Financial Resource Strain: Low Risk   . Difficulty of Paying Living Expenses: Not hard at all  Food Insecurity: No Food Insecurity  . Worried About Charity fundraiser in the Last Year: Never true  .  Ran Out of Food in the Last Year: Never true  Transportation Needs: No Transportation Needs  . Lack of Transportation (Medical): No  . Lack of Transportation (Non-Medical): No  Physical Activity: Not on file  Stress: Not on file  Social Connections: Not on file  Intimate Partner Violence: Not on file   Family History  Problem Relation Age of Onset  . Ovarian cancer Maternal Grandmother 60  . Diverticulitis Mother   . Heart disease Father   . Kidney cancer Father 55  . Stomach cancer Maternal Grandfather        dx 34s  . Breast cancer Other        dx >50, maternal great-aunt  . Breast cancer Other        dx >50, maternal great-aunt  . Breast cancer Other        dx >50, maternal great-aunt  . Colon cancer Neg Hx   . Esophageal cancer Neg Hx   . Rectal cancer Neg Hx     Objective: Office vital signs reviewed. BP 115/71   Pulse 71   Temp 98 F (36.7 C) (Temporal)   Ht 5' 5.5" (1.664 m)   Wt 182 lb 3.2 oz (82.6 kg)   SpO2 99%   BMI 29.86 kg/m   Physical Examination:  General: Awake, alert, well nourished, No acute distress HEENT: Normal; PERRL, EOMI Cardio: regular rate and rhythm, S1S2 heard, no murmurs appreciated Pulm: clear to auscultation bilaterally, no wheezes, rhonchi or rales; normal work of breathing on room air Extremities: warm, well perfused, No edema, cyanosis or clubbing; +2 pulses bilaterally MSK: normal gait and station  Assessment/ Plan: 65 y.o. female   Chronic migraine without aura without status migrainosus, not intractable - Plan: SUMAtriptan (IMITREX) 100 MG tablet  Irritable bowel syndrome with diarrhea - Plan: rifaximin (XIFAXAN) 550 MG TABS tablet  Malignant neoplasm of upper-outer quadrant of left breast in female, estrogen receptor positive (HCC)  Post-menopausal - Plan: DG [facility] DEXA  Imitrex renewed.  Continue Nurtec for prevention.  We briefly discussed Qulipta.  After reading about this new medication, I would be willing to start  but apparently her insurance does not cover this medication at all.  It appears to be excluded entirely from her benefits.  Could consider referral to neurology and perhaps there will be some override with their input as she has failed injectables, beta-blockers, etc.  Nurtec initially was helpful but seems to be waning.  With regards to IBS she again has failed multiple medications including hyoscyamine, probiotics.  We discussed Lomotil and Xifaxan.  Xifaxan has been prescribed and  a coupon card was provided.  I have asked that she follow-up with GI as well if symptoms are ongoing  DEXA scan ordered.  Will CC to Dr. Jana Hakim.  Would consider Prolia given data in breast cancer if patient ends up needing something for her bones.  No orders of the defined types were placed in this encounter.  No orders of the defined types were placed in this encounter.    Carolyn Norlander, DO [place] [phone]

## 2020-07-08 ENCOUNTER — Ambulatory Visit: Payer: 59 | Attending: Family Medicine | Admitting: Physical Therapy

## 2020-07-08 ENCOUNTER — Other Ambulatory Visit: Payer: Self-pay

## 2020-07-08 ENCOUNTER — Encounter: Payer: Self-pay | Admitting: Physical Therapy

## 2020-07-08 DIAGNOSIS — M25552 Pain in left hip: Secondary | ICD-10-CM | POA: Diagnosis present

## 2020-07-08 DIAGNOSIS — M25551 Pain in right hip: Secondary | ICD-10-CM | POA: Diagnosis present

## 2020-07-08 NOTE — Therapy (Signed)
[facility] [place], [place], [address] Phone: [phone]   Fax:  [phone]  Physical Therapy Treatment  Patient Details  Name: Carolyn Cooper MRN: [id number] Date of Birth: 12-12-1955 Referring Provider (PT): Ronnie Doss   Encounter Date: 07/08/2020   PT End of Session - 07/08/20 1537    Visit Number 6    Number of Visits 12    Date for PT Re-Evaluation 07/23/20    PT Start Time 4481    PT Stop Time 8563    PT Time Calculation (min) 47 min    Activity Tolerance Patient tolerated treatment well    Behavior During Therapy [facility] for tasks assessed/performed           Past Medical History:  Diagnosis Date  . Allergy    seasonal  . Anxiety   . Barrett's esophagus   . Breast cancer ([place]) 01/2020   left breast ILC  . Cataract    had surgery  . Depression   . Family history of breast cancer   . Family history of kidney cancer   . Family history of ovarian cancer   . Family history of stomach cancer   . GERD (gastroesophageal reflux disease)   . IBS (irritable bowel syndrome)   . Migraines   . Osteopenia 2019   T score -1.5 FRAX 8% / 0.7%    Past Surgical History:  Procedure Laterality Date  . BREAST LUMPECTOMY WITH RADIOACTIVE SEED AND SENTINEL LYMPH NODE BIOPSY Left 02/14/2020   Procedure: LEFT BREAST LUMPECTOMY WITH RADIOACTIVE SEED AND SENTINEL LYMPH NODE MAPPING;  Surgeon: Erroll Luna, MD;  Location: [place];  Service: General;  Laterality: Left;  PECTORAL BLOCK  . CATARACT EXTRACTION, BILATERAL  2018  . CERVICAL CONE BIOPSY  1996  . COLONOSCOPY  07/21/2007  . CYSTOSCOPY    . MOUTH SURGERY  1978   WISDOM TEETH    There were no vitals filed for this visit.   Subjective Assessment - 07/08/20 1536    Subjective COVID-19 screen performed prior to patient entering clinic. Patient reports a little more sore in L leg than right.    Pertinent History Patient was diagnosed on  01/02/2020 with left grade II invasive ductal carcinoma breast cancer. She underwent a left lumpectomy and sentinel node biopsy (1 negative node) on 02/14/2020. It is ER/PR positive and HER2 negative with a Ki67 of 10%.  H/o SIJ dysfucntion, osteopenia.    How long can you walk comfortably? Short community distances.    Patient Stated Goals To be able to walk for exercise.    Currently in Pain? Yes    Pain Score --   "aches at times"   Pain Orientation Right;Left    Pain Descriptors / Indicators Sore;Dull    Pain Type Chronic pain    Pain Onset More than a month ago    Pain Frequency Intermittent              [facility] PT Assessment - 07/08/20 0001      Assessment   Medical Diagnosis Trochnateric bursitis of both hips.    Referring Provider (PT) Ronnie Doss    Onset Date/Surgical Date 02/14/20    Hand Dominance Left    Prior Therapy Baselines      Precautions   Precautions Other (comment)    Precaution Comments No [place] or electrical stimulation.      Restrictions   Weight Bearing Restrictions No  [facility] Adult PT Treatment/Exercise - 07/08/20 0001      Knee/Hip Exercises: Stretches   ITB Stretch Other (comment)    ITB Stretch Limitations attempted but no stretch felt therefore terminated    Piriformis Stretch Left;2 reps;20 seconds    Piriformis Stretch Limitations knee to opposite shoulder 2x20"; figure 4 stretch 2x20"      Knee/Hip Exercises: Aerobic   Recumbent Bike L3 x10 min, seat 5      Knee/Hip Exercises: Standing   Hip Flexion Stengthening;Both;2 sets;10 reps;Knee bent    Hip Flexion Limitations yellow theraband    Hip Abduction Stengthening;Both;2 sets;10 reps;Knee bent;Knee straight    Rocker Board 4 minutes    Other Standing Knee Exercises lateral stepping x5 yellow theraband      Manual Therapy   Manual Therapy Soft tissue mobilization    Soft tissue mobilization STW and IASTM to B ITB, lateral quad and HS to reduce  TPs and tone                       PT Long Term Goals - 07/08/20 1707      PT LONG TERM GOAL #1   Title Independent with a HEP.    Time 4    Period Weeks    Status New      PT LONG TERM GOAL #2   Title Patient will demonstrate 4+/5 bilateral hip MMT in all planes to improve stability during functional goals    Time 4    Period Weeks    Status New      PT LONG TERM GOAL #3   Title Patient will report ability to walk community distances with bilateral hip pain less than or equal to 4/10.    Time 4    Period Weeks    Status New                 Plan - 07/08/20 1654    Clinical Impression Statement Patient arrives with more L leg pain than R. Patient reported a burning pain in hip abductors with lateral stepping but was able to complete. Patient attempted sidelying ITB stretch but did not report any stretching therefore terminated. Patient noted with loss of L hip external rotation upon assessment; patient provided with figure 4 stretch and knee to opposite hip piriformis stretch to which patient reported understanding.    Personal Factors and Comorbidities Comorbidity 1;Comorbidity 2    Comorbidities Patient was diagnosed on 01/02/2020 with left grade II invasive ductal carcinoma breast cancer. She underwent a left lumpectomy and sentinel node biopsy (1 negative node) on 02/14/2020. It is ER/PR positive and HER2 negative with a Ki67 of 10%.  On chemo pill.   H/o SIJ dysfucntion, osteopenia.    Examination-Activity Limitations Other;Sleep    Examination-Participation Restrictions Other    Stability/Clinical Decision Making Evolving/Moderate complexity    Rehab Potential Excellent    PT Frequency 2x / week    PT Duration 4 weeks    PT Treatment/Interventions Moist Heat;Cryotherapy;Therapeutic activities;Therapeutic exercise;Manual techniques;Passive range of motion    PT Next Visit Plan Nustep with progression to treadmill.  STW/M to bilateral hip musculature.   Lateral hip musculature stretching.  SDLY hip abduction.    PT Home Exercise Plan Post op shoulder ROM HEP    Consulted and Agree with Plan of Care Patient           Patient will benefit from skilled therapeutic intervention in order to improve the following deficits  and impairments:  Pain,Decreased activity tolerance,Decreased range of motion,Increased muscle spasms  Visit Diagnosis: Pain in right hip  Pain in left hip     Problem List Patient Active Problem List   Diagnosis Date Noted  . Genetic testing 02/13/2020  . Family history of ovarian cancer   . Family history of breast cancer   . Family history of stomach cancer   . Family history of kidney cancer   . Malignant neoplasm of upper-outer quadrant of left breast in female, estrogen receptor positive ([place]) 01/30/2020  . Bursitis of both hips 08/01/2018  . Anxiety 10/17/2017  . Weight gain 10/17/2017  . Microscopic hematuria 09/13/2015  . Vitamin D deficiency 09/11/2015  . Knee MCL sprain 04/04/2015  . Nonallopathic lesion of sacral region 01/10/2015  . Nonallopathic lesion of lumbosacral region 01/10/2015  . Nonallopathic lesion of thoracic region 01/10/2015  . SI (sacroiliac) joint dysfunction 12/19/2014  . Osteopenia 01/30/2014  . Migraine headache 01/30/2014    Gabriela Eves, PT, DPT 07/08/2020, 5:18 PM  [facility]  [address] [place], [place], [address] Phone: [phone]   Fax:  [phone]  Name: Carolyn Cooper MRN: [id number] Date of Birth: 02-27-1956

## 2020-07-10 ENCOUNTER — Encounter: Payer: 59 | Admitting: *Deleted

## 2020-07-14 ENCOUNTER — Other Ambulatory Visit: Payer: Self-pay | Admitting: Family Medicine

## 2020-07-14 ENCOUNTER — Other Ambulatory Visit: Payer: Self-pay | Admitting: Gastroenterology

## 2020-07-14 DIAGNOSIS — F419 Anxiety disorder, unspecified: Secondary | ICD-10-CM

## 2020-07-15 ENCOUNTER — Ambulatory Visit: Payer: 59 | Admitting: Physical Therapy

## 2020-07-15 ENCOUNTER — Encounter: Payer: Self-pay | Admitting: Physical Therapy

## 2020-07-15 ENCOUNTER — Other Ambulatory Visit: Payer: Self-pay

## 2020-07-15 DIAGNOSIS — M25551 Pain in right hip: Secondary | ICD-10-CM | POA: Diagnosis not present

## 2020-07-15 DIAGNOSIS — M25552 Pain in left hip: Secondary | ICD-10-CM

## 2020-07-15 NOTE — Therapy (Signed)
[facility] [place], [place], [address] Phone: [phone]   Fax:  [phone]  Physical Therapy Treatment  Patient Details  Name: Carolyn Cooper MRN: [id number] Date of Birth: 1955-05-08 Referring Provider (PT): Ronnie Doss   Encounter Date: 07/15/2020   PT End of Session - 07/15/20 1312    Visit Number 7    Number of Visits 12    Date for PT Re-Evaluation 07/23/20    PT Start Time 7425    PT Stop Time 1345    PT Time Calculation (min) 40 min    Activity Tolerance Patient tolerated treatment well    Behavior During Therapy [facility] for tasks assessed/performed           Past Medical History:  Diagnosis Date  . Allergy    seasonal  . Anxiety   . Barrett's esophagus   . Breast cancer ([place]) 01/2020   left breast ILC  . Cataract    had surgery  . Depression   . Family history of breast cancer   . Family history of kidney cancer   . Family history of ovarian cancer   . Family history of stomach cancer   . GERD (gastroesophageal reflux disease)   . IBS (irritable bowel syndrome)   . Migraines   . Osteopenia 2019   T score -1.5 FRAX 8% / 0.7%    Past Surgical History:  Procedure Laterality Date  . BREAST LUMPECTOMY WITH RADIOACTIVE SEED AND SENTINEL LYMPH NODE BIOPSY Left 02/14/2020   Procedure: LEFT BREAST LUMPECTOMY WITH RADIOACTIVE SEED AND SENTINEL LYMPH NODE MAPPING;  Surgeon: Erroll Luna, MD;  Location: [place];  Service: General;  Laterality: Left;  PECTORAL BLOCK  . CATARACT EXTRACTION, BILATERAL  2018  . CERVICAL CONE BIOPSY  1996  . COLONOSCOPY  07/21/2007  . CYSTOSCOPY    . MOUTH SURGERY  1978   WISDOM TEETH    There were no vitals filed for this visit.   Subjective Assessment - 07/15/20 1311    Subjective COVID-19 screen performed prior to patient entering clinic. Patient reports that since the last visit she has ended up feeling better.    Pertinent History  Patient was diagnosed on 01/02/2020 with left grade II invasive ductal carcinoma breast cancer. She underwent a left lumpectomy and sentinel node biopsy (1 negative node) on 02/14/2020. It is ER/PR positive and HER2 negative with a Ki67 of 10%.  H/o SIJ dysfucntion, osteopenia.    How long can you walk comfortably? Short community distances.    Patient Stated Goals To be able to walk for exercise.    Currently in Pain? No/denies              [facility] PT Assessment - 07/15/20 0001      Assessment   Medical Diagnosis Trochnateric bursitis of both hips.    Referring Provider (PT) Ronnie Doss    Onset Date/Surgical Date 02/14/20    Hand Dominance Left    Prior Therapy Baselines      Precautions   Precautions Other (comment)    Precaution Comments No [place] or electrical stimulation.      Restrictions   Weight Bearing Restrictions No                         [facility] Adult PT Treatment/Exercise - 07/15/20 0001      Knee/Hip Exercises: Aerobic   Recumbent Bike L3 x10 min, seat 5  Knee/Hip Exercises: Machines for Strengthening   Cybex Leg Press 1 pl, seat 6 x20 reps with red theraband      Knee/Hip Exercises: Standing   Hip Flexion Stengthening;Both;2 sets;10 reps;Knee bent    Hip Flexion Limitations red theraband    Hip Abduction Stengthening;Both;2 sets;10 reps;Knee straight;Limitations    Abduction Limitations red theraband    Hip Extension Stengthening;Both;2 sets;10 reps;Knee bent    Extension Limitations red theraband      Knee/Hip Exercises: Supine   Bridges with Clamshell Strengthening;Both;15 reps;Limitations   red theraband     Manual Therapy   Manual Therapy Soft tissue mobilization    Soft tissue mobilization STW to B ITB, quad to reduce tone                       PT Long Term Goals - 07/08/20 1707      PT LONG TERM GOAL #1   Title Independent with a HEP.    Time 4    Period Weeks    Status New      PT LONG TERM GOAL #2    Title Patient will demonstrate 4+/5 bilateral hip MMT in all planes to improve stability during functional goals    Time 4    Period Weeks    Status New      PT LONG TERM GOAL #3   Title Patient will report ability to walk community distances with bilateral hip pain less than or equal to 4/10.    Time 4    Period Weeks    Status New                 Plan - 07/15/20 1349    Clinical Impression Statement Patient presented in clinic with no hip pain. Patient progressed to more resisted strengthening of BLEs. No increase in pain reported during therex session. Intermittant TPs and min tone palpable in B ITB and quad at this time. No negetive reports were provided by patient during manual therapy session.    Personal Factors and Comorbidities Comorbidity 1;Comorbidity 2    Comorbidities Patient was diagnosed on 01/02/2020 with left grade II invasive ductal carcinoma breast cancer. She underwent a left lumpectomy and sentinel node biopsy (1 negative node) on 02/14/2020. It is ER/PR positive and HER2 negative with a Ki67 of 10%.  On chemo pill.   H/o SIJ dysfucntion, osteopenia.    Examination-Activity Limitations Other;Sleep    Examination-Participation Restrictions Other    Stability/Clinical Decision Making Evolving/Moderate complexity    Rehab Potential Excellent    PT Frequency 2x / week    PT Duration 4 weeks    PT Treatment/Interventions Moist Heat;Cryotherapy;Therapeutic activities;Therapeutic exercise;Manual techniques;Passive range of motion    PT Next Visit Plan Nustep with progression to treadmill.  STW/M to bilateral hip musculature.  Lateral hip musculature stretching.  SDLY hip abduction.    PT Home Exercise Plan Post op shoulder ROM HEP    Consulted and Agree with Plan of Care Patient           Patient will benefit from skilled therapeutic intervention in order to improve the following deficits and impairments:  Pain,Decreased activity tolerance,Decreased range of  motion,Increased muscle spasms  Visit Diagnosis: Pain in right hip  Pain in left hip     Problem List Patient Active Problem List   Diagnosis Date Noted  . Genetic testing 02/13/2020  . Family history of ovarian cancer   . Family history of breast cancer   .  Family history of stomach cancer   . Family history of kidney cancer   . Malignant neoplasm of upper-outer quadrant of left breast in female, estrogen receptor positive ([place]) 01/30/2020  . Bursitis of both hips 08/01/2018  . Anxiety 10/17/2017  . Weight gain 10/17/2017  . Microscopic hematuria 09/13/2015  . Vitamin D deficiency 09/11/2015  . Knee MCL sprain 04/04/2015  . Nonallopathic lesion of sacral region 01/10/2015  . Nonallopathic lesion of lumbosacral region 01/10/2015  . Nonallopathic lesion of thoracic region 01/10/2015  . SI (sacroiliac) joint dysfunction 12/19/2014  . Osteopenia 01/30/2014  . Migraine headache 01/30/2014    Standley Brooking, PTA 07/15/2020, 1:59 PM  [facility] [place], [place], [address] Phone: [phone]   Fax:  [phone]  Name: Carolyn Cooper MRN: [id number] Date of Birth: November 24, 1955

## 2020-07-22 ENCOUNTER — Ambulatory Visit: Payer: 59 | Admitting: Physical Therapy

## 2020-07-22 ENCOUNTER — Other Ambulatory Visit: Payer: Self-pay

## 2020-07-22 ENCOUNTER — Encounter: Payer: Self-pay | Admitting: Physical Therapy

## 2020-07-22 DIAGNOSIS — M25552 Pain in left hip: Secondary | ICD-10-CM

## 2020-07-22 DIAGNOSIS — M25551 Pain in right hip: Secondary | ICD-10-CM | POA: Diagnosis not present

## 2020-07-22 NOTE — Therapy (Signed)
[facility] [place], [place], [address] Phone: [phone]   Fax:  [phone]  Physical Therapy Treatment  Patient Details  Name: Carolyn Cooper MRN: [id number] Date of Birth: 1955-04-21 Referring Provider (PT): Ronnie Doss   Encounter Date: 07/22/2020    Past Medical History:  Diagnosis Date  . Allergy    seasonal  . Anxiety   . Barrett's esophagus   . Breast cancer ([place]) 01/2020   left breast ILC  . Cataract    had surgery  . Depression   . Family history of breast cancer   . Family history of kidney cancer   . Family history of ovarian cancer   . Family history of stomach cancer   . GERD (gastroesophageal reflux disease)   . IBS (irritable bowel syndrome)   . Migraines   . Osteopenia 2019   T score -1.5 FRAX 8% / 0.7%    Past Surgical History:  Procedure Laterality Date  . BREAST LUMPECTOMY WITH RADIOACTIVE SEED AND SENTINEL LYMPH NODE BIOPSY Left 02/14/2020   Procedure: LEFT BREAST LUMPECTOMY WITH RADIOACTIVE SEED AND SENTINEL LYMPH NODE MAPPING;  Surgeon: Erroll Luna, MD;  Location: [place];  Service: General;  Laterality: Left;  PECTORAL BLOCK  . CATARACT EXTRACTION, BILATERAL  2018  . CERVICAL CONE BIOPSY  1996  . COLONOSCOPY  07/21/2007  . CYSTOSCOPY    . MOUTH SURGERY  1978   WISDOM TEETH    There were no vitals filed for this visit.   Subjective Assessment - 07/22/20 1521    Subjective COVID-19 screen performed prior to patient entering clinic. Patient reporting LBP but no hip related pain.    Pertinent History Patient was diagnosed on 01/02/2020 with left grade II invasive ductal carcinoma breast cancer. She underwent a left lumpectomy and sentinel node biopsy (1 negative node) on 02/14/2020. It is ER/PR positive and HER2 negative with a Ki67 of 10%.  H/o SIJ dysfucntion, osteopenia.    How long can you walk comfortably? Short community distances.    Patient  Stated Goals To be able to walk for exercise.    Currently in Pain? Yes    Pain Location Back    Pain Orientation Lower    Pain Descriptors / Indicators Discomfort    Pain Type Chronic pain    Pain Onset More than a month ago    Pain Frequency Intermittent              [facility] PT Assessment - 07/22/20 0001      Assessment   Medical Diagnosis Trochnateric bursitis of both hips.    Referring Provider (PT) Ronnie Doss    Onset Date/Surgical Date 02/14/20    Hand Dominance Left    Prior Therapy Baselines      Precautions   Precautions Other (comment)    Precaution Comments No [place] or electrical stimulation.      Restrictions   Weight Bearing Restrictions No                         [facility] Adult PT Treatment/Exercise - 07/22/20 0001      Knee/Hip Exercises: Stretches   Hip Flexor Stretch Right;Left;2 reps;60 seconds      Knee/Hip Exercises: Aerobic   Recumbent Bike L4 x10 min, seat 4      Knee/Hip Exercises: Machines for Strengthening   Cybex Leg Press 1.5 pl, sesat 7 x30 reps with red theraband clam  Knee/Hip Exercises: Supine   Bridges with Clamshell Strengthening;Both;20 reps;Limitations   red theraband     Knee/Hip Exercises: Sidelying   Clams B hip clam red theraband x30 reps      Manual Therapy   Manual Therapy Soft tissue mobilization    Soft tissue mobilization STW to B ITB, quad, HS to reduce tone                       PT Long Term Goals - 07/22/20 1716      PT LONG TERM GOAL #1   Title Independent with a HEP.    Time 4    Period Weeks    Status On-going      PT LONG TERM GOAL #2   Title Patient will demonstrate 4+/5 bilateral hip MMT in all planes to improve stability during functional goals    Time 4    Period Weeks    Status On-going      PT LONG TERM GOAL #3   Title Patient will report ability to walk community distances with bilateral hip pain less than or equal to 4/10.    Time 4    Period Weeks    Status  On-going                 Plan - 07/22/20 1609    Clinical Impression Statement Patient presented in clinic with reports of more upper back pain today from lifting dog food. Patient guided through more resistive hip abductor strengthening. Slightly more TPs palpabled in R ITB today and some soreness per patient report. Patient reporting some discomfort while in SL but no real limitations with function.    Personal Factors and Comorbidities Comorbidity 1;Comorbidity 2    Comorbidities Patient was diagnosed on 01/02/2020 with left grade II invasive ductal carcinoma breast cancer. She underwent a left lumpectomy and sentinel node biopsy (1 negative node) on 02/14/2020. It is ER/PR positive and HER2 negative with a Ki67 of 10%.  On chemo pill.   H/o SIJ dysfucntion, osteopenia.    Examination-Activity Limitations Other;Sleep    Examination-Participation Restrictions Other    Stability/Clinical Decision Making Evolving/Moderate complexity    Rehab Potential Excellent    PT Frequency 2x / week    PT Duration 4 weeks    PT Treatment/Interventions Moist Heat;Cryotherapy;Therapeutic activities;Therapeutic exercise;Manual techniques;Passive range of motion    PT Next Visit Plan Nustep with progression to treadmill.  STW/M to bilateral hip musculature.  Lateral hip musculature stretching.  SDLY hip abduction.    PT Home Exercise Plan Post op shoulder ROM HEP    Consulted and Agree with Plan of Care Patient           Patient will benefit from skilled therapeutic intervention in order to improve the following deficits and impairments:  Pain,Decreased activity tolerance,Decreased range of motion,Increased muscle spasms  Visit Diagnosis: Pain in right hip  Pain in left hip     Problem List Patient Active Problem List   Diagnosis Date Noted  . Genetic testing 02/13/2020  . Family history of ovarian cancer   . Family history of breast cancer   . Family history of stomach cancer   . Family  history of kidney cancer   . Malignant neoplasm of upper-outer quadrant of left breast in female, estrogen receptor positive ([place]) 01/30/2020  . Bursitis of both hips 08/01/2018  . Anxiety 10/17/2017  . Weight gain 10/17/2017  . Microscopic hematuria 09/13/2015  . Vitamin D deficiency  09/11/2015  . Knee MCL sprain 04/04/2015  . Nonallopathic lesion of sacral region 01/10/2015  . Nonallopathic lesion of lumbosacral region 01/10/2015  . Nonallopathic lesion of thoracic region 01/10/2015  . SI (sacroiliac) joint dysfunction 12/19/2014  . Osteopenia 01/30/2014  . Migraine headache 01/30/2014    Standley Brooking, PTA 07/22/2020, 5:17 PM  [facility] [place], [place], [address] Phone: [phone]   Fax:  [phone]  Name: Carolyn Cooper MRN: [id number] Date of Birth: [birth year]

## 2020-07-23 ENCOUNTER — Other Ambulatory Visit: Payer: Self-pay | Admitting: Family Medicine

## 2020-07-23 DIAGNOSIS — M533 Sacrococcygeal disorders, not elsewhere classified: Secondary | ICD-10-CM

## 2020-07-23 DIAGNOSIS — M7071 Other bursitis of hip, right hip: Secondary | ICD-10-CM

## 2020-07-23 NOTE — Telephone Encounter (Signed)
Not on current med list but on history  Last office visit 07/07/20 Last refill 12/11/19, #90, 1 refill

## 2020-07-29 ENCOUNTER — Ambulatory Visit: Payer: 59 | Admitting: Physical Therapy

## 2020-08-17 ENCOUNTER — Encounter: Payer: Self-pay | Admitting: Family Medicine

## 2020-08-17 ENCOUNTER — Other Ambulatory Visit: Payer: Self-pay | Admitting: Family Medicine

## 2020-08-17 DIAGNOSIS — G43709 Chronic migraine without aura, not intractable, without status migrainosus: Secondary | ICD-10-CM

## 2020-08-26 ENCOUNTER — Other Ambulatory Visit: Payer: Self-pay

## 2020-08-26 ENCOUNTER — Ambulatory Visit
Admission: RE | Admit: 2020-08-26 | Discharge: 2020-08-26 | Disposition: A | Discharge status: Home / Self Care | Payer: 59 | Source: Ambulatory Visit | Attending: Oncology | Admitting: Oncology

## 2020-08-26 ENCOUNTER — Encounter: Payer: Self-pay | Admitting: Family Medicine

## 2020-08-26 DIAGNOSIS — M8580 Other specified disorders of bone density and structure, unspecified site: Secondary | ICD-10-CM

## 2020-08-26 DIAGNOSIS — C50412 Malignant neoplasm of upper-outer quadrant of left female breast: Secondary | ICD-10-CM

## 2020-08-27 ENCOUNTER — Ambulatory Visit: Payer: 59 | Admitting: Nurse Practitioner

## 2020-08-27 DIAGNOSIS — W57XXXA Bitten or stung by nonvenomous insect and other nonvenomous arthropods, initial encounter: Secondary | ICD-10-CM | POA: Insufficient documentation

## 2020-08-27 DIAGNOSIS — S40261A Insect bite (nonvenomous) of right shoulder, initial encounter: Secondary | ICD-10-CM | POA: Diagnosis not present

## 2020-08-27 MED ORDER — DOXYCYCLINE HYCLATE 100 MG PO TABS: 100.0000 mg | ORAL_TABLET | Freq: Two times a day (BID) | ORAL | 0 refills | Status: DC

## 2020-08-27 NOTE — Progress Notes (Signed)
Acute Office Visit  Subjective:    Patient ID: Carolyn Cooper, female    DOB: 1955/12/11, 65 y.o.   MRN: [id number]  Chief Complaint  Patient presents with  . Tick Removal    HPI Patient is in today for tick bite in the last 24 to 48 hours.  Patient is unable to tell how long tick has been embedded in skin.  No fever, nausea vomiting or pain.  Erythema and itchiness reported on bite area.  Patient was able to remove tick with tweezers and wiped in with alcohol.  Past Medical History:  Diagnosis Date  . Allergy    seasonal  . Anxiety   . Barrett's esophagus   . Breast cancer ([place]) 01/2020   left breast ILC  . Cataract    had surgery  . Depression   . Family history of breast cancer   . Family history of kidney cancer   . Family history of ovarian cancer   . Family history of stomach cancer   . GERD (gastroesophageal reflux disease)   . IBS (irritable bowel syndrome)   . Migraines   . Osteopenia 2019   T score -1.5 FRAX 8% / 0.7%    Past Surgical History:  Procedure Laterality Date  . BREAST LUMPECTOMY WITH RADIOACTIVE SEED AND SENTINEL LYMPH NODE BIOPSY Left 02/14/2020   Procedure: LEFT BREAST LUMPECTOMY WITH RADIOACTIVE SEED AND SENTINEL LYMPH NODE MAPPING;  Surgeon: Erroll Luna, MD;  Location: [facility];  Service: General;  Laterality: Left;  PECTORAL BLOCK  . CATARACT EXTRACTION, BILATERAL  2018  . CERVICAL CONE BIOPSY  1996  . COLONOSCOPY  07/21/2007  . CYSTOSCOPY    . MOUTH SURGERY  1978   WISDOM TEETH    Family History  Problem Relation Age of Onset  . Ovarian cancer Maternal Grandmother 60  . Diverticulitis Mother   . Heart disease Father   . Kidney cancer Father 74  . Stomach cancer Maternal Grandfather        dx 56s  . Breast cancer Other        dx >50, maternal great-aunt  . Breast cancer Other        dx >50, maternal great-aunt  . Breast cancer Other        dx >50, maternal great-aunt  . Colon cancer Neg Hx   .  Esophageal cancer Neg Hx   . Rectal cancer Neg Hx     Social History   Socioeconomic History  . Marital status: Married    Spouse name: Not on file  . Number of children: Not on file  . Years of education: Not on file  . Highest education level: Not on file  Occupational History  . Not on file  Tobacco Use  . Smoking status: Never Smoker  . Smokeless tobacco: Never Used  Vaping Use  . Vaping Use: Never used  Substance and Sexual Activity  . Alcohol use: Not Currently    Alcohol/week: 0.0 standard drinks  . Drug use: No  . Sexual activity: Yes    Birth control/protection: Post-menopausal    Comment: 1st intercourse 65 yo-Fewer than 5 partners  Other Topics Concern  . Not on file  Social History Narrative  . Not on file   Social Determinants of Health   Financial Resource Strain: Low Risk   . Difficulty of Paying Living Expenses: Not hard at all  Food Insecurity: No Food Insecurity  . Worried About Charity fundraiser in  the Last Year: Never true  . Ran Out of Food in the Last Year: Never true  Transportation Needs: No Transportation Needs  . Lack of Transportation (Medical): No  . Lack of Transportation (Non-Medical): No  Physical Activity: Not on file  Stress: Not on file  Social Connections: Not on file  Intimate Partner Violence: Not on file    Outpatient Medications Prior to Visit  Medication Sig Dispense Refill  . anastrozole (ARIMIDEX) 1 MG tablet Take 1 tablet (1 mg total) by mouth daily. 90 tablet 4  . calcium carbonate (OS-CAL) 600 MG TABS Take 600 mg by mouth 2 (two) times daily with a meal.    . Cholecalciferol (VITAMIN D) 400 UNITS capsule Take 800 Units by mouth daily.    Marland Kitchen escitalopram (LEXAPRO) 20 MG tablet TAKE 1 TABLET BY MOUTH EVERY DAY 90 tablet 0  . hyoscyamine (LEVSIN SL) 0.125 MG SL tablet TAKE I CAPSULE BY MOUTH DAILY AS NEEDED 30 tablet 1  . omeprazole (PRILOSEC) 40 MG capsule TAKE 1 CAPSULE BY MOUTH EVERY DAY 90 capsule 1  . Rimegepant  Sulfate (NURTEC) 75 MG TBDP Take 1 tablet by mouth every other day. 16 tablet 11  . SUMAtriptan (IMITREX) 100 MG tablet TAKE 1 TABLET BY MOUTH EVERY 2 HOURS AS NEEDED MAY REPEAT IN 2 HOURS IF HEADACHE PERSISTS 9 tablet 12  . vitamin C (ASCORBIC ACID) 250 MG tablet Take 500 mg by mouth daily.    . meloxicam (MOBIC) 15 MG tablet TAKE 1 TABLET BY MOUTH EVERY DAY AS NEEDED FOR PAIN 90 tablet 1  . Nutritional Supplements (IMMUNE ENHANCE PO) Take by mouth. (Patient not taking: Reported on 07/07/2020)     No facility-administered medications prior to visit.    Allergies  Allergen Reactions  . Sulfa Antibiotics Hives    Review of Systems  Constitutional: Negative.   HENT: Negative.   Respiratory: Negative.   Cardiovascular: Negative.   Skin: Positive for color change and rash.  All other systems reviewed and are negative.      Objective:    Physical Exam Vitals and nursing note reviewed.  Constitutional:      Appearance: Normal appearance.  HENT:     Head: Normocephalic.     Nose: Nose normal.  Eyes:     Conjunctiva/sclera: Conjunctivae normal.  Cardiovascular:     Rate and Rhythm: Normal rate and regular rhythm.     Pulses: Normal pulses.  Pulmonary:     Effort: Pulmonary effort is normal.     Breath sounds: Normal breath sounds.  Abdominal:     General: Bowel sounds are normal.  Skin:    Findings: Erythema and rash present.  Neurological:     Mental Status: She is alert and oriented to person, place, and time.     There were no vitals taken for this visit. Wt Readings from Last 3 Encounters:  07/07/20 182 lb 3.2 oz (82.6 kg)  04/16/20 176 lb 6.4 oz (80 kg)  03/27/20 180 lb 2 oz (81.7 kg)    Health Maintenance Due  Topic Date Due  . HIV Screening  Never done  . Zoster Vaccines- Shingrix (1 of 2) 08/12/2006  . COVID-19 Vaccine (3 - Moderna risk 4-dose series) 06/08/2019    There are no preventive care reminders to display for this patient.   Lab Results   Component Value Date   TSH 1.620 05/15/2019   Lab Results  Component Value Date   WBC 7.5 04/16/2020   HGB  13.1 04/16/2020   HCT 38.8 04/16/2020   MCV 96.8 04/16/2020   PLT 271 04/16/2020   Lab Results  Component Value Date   NA 141 04/16/2020   K 3.7 04/16/2020   CO2 26 04/16/2020   GLUCOSE 91 04/16/2020   BUN 13 04/16/2020   CREATININE 0.87 04/16/2020   BILITOT 0.6 04/16/2020   ALKPHOS 107 04/16/2020   AST 20 04/16/2020   ALT 26 04/16/2020   PROT 7.2 04/16/2020   ALBUMIN 4.2 04/16/2020   CALCIUM 9.5 04/16/2020   ANIONGAP 9 04/16/2020   Lab Results  Component Value Date   CHOL 171 08/02/2018   Lab Results  Component Value Date   HDL 67 08/02/2018   Lab Results  Component Value Date   LDLCALC 92 08/02/2018   Lab Results  Component Value Date   TRIG 61 08/02/2018   Lab Results  Component Value Date   CHOLHDL 2.6 08/02/2018   Lab Results  Component Value Date   HGBA1C 5.5 05/15/2019       Assessment & Plan:   Problem List Items Addressed This Visit      Musculoskeletal and Integument   Tick bite of shoulder, right, initial encounter - Primary    Tick bite  in the last 24 to 48 hours.  Doxycycline 100 mg tablet by mouth, 2 tablets given for prophylaxis.  Lyme ABS/Western blot lab test put in for future in  2 to 3 weeks.   Advised patient to follow-up with flulike symptoms.  Patient verbalized understanding.        Relevant Medications   doxycycline (VIBRA-TABS) 100 MG tablet   Other Relevant Orders   Lyme Disease Serology w/Reflex       Meds ordered this encounter  Medications  . doxycycline (VIBRA-TABS) 100 MG tablet    Sig: Take 1 tablet (100 mg total) by mouth 2 (two) times daily.    Dispense:  2 tablet    Refill:  0    Order Specific Question:   Supervising Provider    Answer:   Janora Norlander [[id number]]     Ivy Lynn, NP

## 2020-08-27 NOTE — Patient Instructions (Signed)
Tick Bite Information, Adult  Ticks are insects that can bite. Most ticks live in shrubs and grassy areas. They climb onto people and animals that go by. Then they bite. Some ticks carry germs that can make you sick. How can I prevent tick bites? Take these steps: Use insect repellent  Use an insect repellent that has 20% or higher of the ingredients DEET, picaridin, or IR3535. Follow the instructions on the label. Put it on: ? Bare skin. ? The tops of your boots. ? Your pant legs. ? The ends of your sleeves.  If you use an insect repellent that has the ingredient permethrin, follow the instructions on the label. Put it on: ? Clothing. ? Boots. ? Supplies or outdoor gear. ? Tents. When you are outside  Wear long sleeves and long pants.  Wear light-colored clothes.  Tuck your pant legs into your socks.  Stay in the middle of the trail. Do not touch the bushes.  Avoid walking through long grass.  Check for ticks on your clothes, hair, and skin often while you are outside. Before going inside your house, check your clothes, skin, head, neck, armpits, waist, groin, and joint areas. When you go indoors  Check your clothes for ticks. Dry your clothes in a dryer on high heat for 10 minutes or more. If clothes are damp, additional time may be needed.  Wash your clothes right away if they need to be washed. Use hot water.  Check your pets and outdoor gear.  Shower right away.  Check your body for ticks. Do a full body check using a mirror. What is the right way to remove a tick? Remove the tick from your skin as soon as possible. Do not remove the tick with your bare fingers.  To remove a tick that is crawling on your skin: ? Go outdoors and brush the tick off. ? Use tape or a lint roller.  To remove a tick that is biting: 1. Wash your hands. 2. If you have latex gloves, put them on. 3. Use tweezers, curved forceps, or a tick-removal tool to grasp the tick. Grasp the tick  as close to your skin and as close to the tick's head as possible. 4. Gently pull up until the tick lets go.  Try to keep the tick's head attached to its body.  Do not twist or jerk the tick.  Do not squeeze or crush the tick. Do not try to remove a tick with heat, alcohol, petroleum jelly, or fingernail polish.   What should I do after taking out a tick?  Throw away the tick. Do not crush a tick with your fingers.  Clean the bite area and your hands with soap and water, rubbing alcohol, or an iodine wash.  If an antiseptic cream or ointment is available, apply a small amount to the bite area.  Wash and disinfect any instruments that you used to remove the tick. How should I get rid of a live tick? To dispose of a live tick, use one of these methods:  Place the tick in rubbing alcohol.  Place the tick in a bag or container you can close tightly.  Wrap the tick tightly in tape.  Flush the tick down the toilet. Contact a doctor if:  You have symptoms, such as: ? A fever or chills. ? A red rash that makes a circle (bull's-eye rash) in the bite area. ? Redness and swelling where the tick bit you. ? Headache. ?  Pain in a muscle, joint, or bone. ? Being more tired than normal. ? Trouble walking or moving your legs. ? Numbness in your legs. ? Tender and swollen lymph glands.  A part of a tick breaks off and gets stuck in your skin. Get help right away if:  You cannot remove a tick.  You cannot move (have paralysis) or feel weak.  You are feeling worse or have new symptoms.  You find a tick that is biting you and filled with blood. This is important if you are in an area where diseases from ticks are common. Summary  Ticks may carry germs that can make you sick.  To prevent tick bites wear long sleeves, long pants, and light colors. Use insect repellent. Follow the instructions on the label.  If the tick is biting, do not try to remove it with heat, alcohol, petroleum  jelly, or fingernail polish.  Use tweezers, curved forceps, or a tick-removal tool to grasp the tick. Gently pull up until the tick lets go. Do not twist or jerk the tick. Do not squeeze or crush the tick.  If you have symptoms, contact a doctor. This information is not intended to replace advice given to you by your health care provider. Make sure you discuss any questions you have with your health care provider. Document Revised: 03/19/2019 Document Reviewed: 03/19/2019 Elsevier Patient Education  2021 [employer].

## 2020-08-29 ENCOUNTER — Ambulatory Visit: Payer: Self-pay | Admitting: Nurse Practitioner

## 2020-08-29 ENCOUNTER — Encounter: Payer: Self-pay | Admitting: Nurse Practitioner

## 2020-08-29 NOTE — Assessment & Plan Note (Signed)
Tick bite  in the last 24 to 48 hours.  Doxycycline 100 mg tablet by mouth, 2 tablets given for prophylaxis.  Lyme ABS/Western blot lab test put in for future in  2 to 3 weeks.   Advised patient to follow-up with flulike symptoms.  Patient verbalized understanding.

## 2020-09-01 NOTE — Progress Notes (Signed)
Carolyn Cooper  Telephone:([phone]) [phone] Fax:(336) [phone]     ID: Carolyn Cooper DOB: January 20, 1956  MR#: [id number]  DDU#:[id number]  Patient Care Team: Janora Norlander, DO as PCP - General (Family Medicine) Mauro Kaufmann, RN as Oncology Nurse Navigator Rockwell Germany, RN as Oncology Nurse Navigator Nixon Kolton, Virgie Dad, MD as Consulting Physician (Oncology) Kyung Rudd, MD as Consulting Physician (Radiation Oncology) Erroll Luna, MD as Consulting Physician (General Surgery) Tamela Gammon, NP as Nurse Practitioner (Gynecology) Mauri Pole, MD as Consulting Physician (Gastroenterology) Lavonna Monarch, MD as Consulting Physician (Dermatology) Chauncey Cruel, MD OTHER MD:   CHIEF COMPLAINT: Invasive lobular breast cancer  CURRENT TREATMENT: anastrozole   INTERVAL HISTORY: Carolyn Cooper returns today for follow up of her invasive lobular breast cancer.   She started anastrozole on 05/19/2020.  She is tolerating this remarkably well.  She does not have any arthralgias or myalgias.  She had some hot flashes initially but those have abated.  Vaginal dryness has not changed.  It is currently not a major issue for her  Since her last visit, she underwent bone density screening on 07/07/2020 showing a T-score of -1.4, which is considered osteopenic.  She also underwent bilateral diagnostic mammography with tomography at [facility] on 08/26/2020 showing: breast density category C; no evidence of malignancy in either breast.    REVIEW OF SYSTEMS: Carolyn Cooper is doing fine from a breast cancer point of view but is under a great deal of stress otherwise.  Her husband has multiple myeloma and is about to undergo autologous stem cell transplant at [facility].  She also has 1 son with alcohol problem.  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  They are actually doing about as well as they can with these issues but nevertheless it is a problem.  She is not  exercising regularly.  A detailed review of systems was otherwise benign.   COVID 19 VACCINATION STATUS: Status post 2 doses, booster pending as of January 2022   HISTORY OF CURRENT ILLNESS: From the original intake note:  "Carolyn Cooper" had routine screening mammography on 01/02/2020 showing a possible abnormality in the left breast. She underwent left diagnostic mammography with tomography and left breast ultrasonography at [facility] on 01/15/2020 showing: breast density category C; suspicious 3 mm mass in left breast at 2:30; no left lymphadenopathy.  Accordingly on 01/28/2020 she proceeded to biopsy of the left breast area in question. The pathology from this procedure ([id number]) showed: invasive and in situ mammary carcinoma, e-cadherin negative, grade 2. Prognostic indicators significant for: estrogen receptor, >95% positive with strong staining intensity and progesterone receptor, 20% positive with moderate staining intensity. Proliferation marker Ki67 at 10%. HER2 equivocal by immunohistochemistry (2+), but negative by fluorescent in situ hybridization with a signals ratio 1.52 and number per cell 3.5.  The patient's subsequent history is as detailed below.   PAST MEDICAL HISTORY: Past Medical History:  Diagnosis Date  . Allergy    seasonal  . Anxiety   . Barrett's esophagus   . Breast cancer ([facility]) 01/2020   left breast ILC  . Cataract    had surgery  . Depression   . Family history of breast cancer   . Family history of kidney cancer   . Family history of ovarian cancer   . Family history of stomach cancer   . GERD (gastroesophageal reflux disease)   . IBS (irritable bowel syndrome)   . Migraines   .  Osteopenia 2019   T score -1.5 FRAX 8% / 0.7%    PAST SURGICAL HISTORY: Past Surgical History:  Procedure Laterality Date  . BREAST LUMPECTOMY WITH RADIOACTIVE SEED AND SENTINEL LYMPH NODE BIOPSY Left 02/14/2020   Procedure: LEFT BREAST LUMPECTOMY WITH RADIOACTIVE  SEED AND SENTINEL LYMPH NODE MAPPING;  Surgeon: Erroll Luna, MD;  Location: [facility];  Service: General;  Laterality: Left;  PECTORAL BLOCK  . CATARACT EXTRACTION, BILATERAL  2018  . CERVICAL CONE BIOPSY  1996  . COLONOSCOPY  07/21/2007  . CYSTOSCOPY    . MOUTH SURGERY  1978   WISDOM TEETH    FAMILY HISTORY: Family History  Problem Relation Age of Onset  . Ovarian cancer Maternal Grandmother 60  . Diverticulitis Mother   . Heart disease Father   . Kidney cancer Father 40  . Stomach cancer Maternal Grandfather        dx 17s  . Breast cancer Other        dx >50, maternal great-aunt  . Breast cancer Other        dx >50, maternal great-aunt  . Breast cancer Other        dx >50, maternal great-aunt  . Colon cancer Neg Hx   . Esophageal cancer Neg Hx   . Rectal cancer Neg Hx    Her parents are living as of 02/2020-- her father at age 18 and her mother at age 84. Carolyn Cooper has one sister (and no brothers). She notes her mother had previously undergone genetic testing for BRCA 1 and 2, and she was negative. She reports ovarian cancer in her maternal grandmother at age 80, stomach cancer in her maternal grandfather in his 4's, and kidney cancer in her dad at age 47.   GYNECOLOGIC HISTORY:  No LMP recorded. Patient is postmenopausal. Menarche: 65 years old Age at first live birth: 65 years old [place] P 4 LMP 2015? Contraceptive: used "pretty much from 56 on" with no issues HRT used briefly, stopped "about 5 years ago"  Hysterectomy? no BSO? 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 Husband Remo Lipps works for [employer]. Daughter Denton Meek, age 81, is a Cabin crew here in [place]. Son Delrae Rend, age 24, is unemployed. Daughter Markus Jarvis, age 33, is a homemaker here in [place]. Son Berdine Dance, age 97, works in Chartered loss adjuster in [place]. Carolyn Cooper has 4 grandchildren. She  attends a [employer].    ADVANCED DIRECTIVES: In the absence of any documentation to the contrary, the patient's spouse is their HCPOA.    HEALTH MAINTENANCE: Social History   Tobacco Use  . Smoking status: Never Smoker  . Smokeless tobacco: Never Used  Vaping Use  . Vaping Use: Never used  Substance Use Topics  . Alcohol use: Not Currently    Alcohol/week: 0.0 standard drinks  . Drug use: No     Colonoscopy: 10/2017 (Dr. Silverio Decamp), repeat 2024  PAP: 01/2018, negative  Bone density: 05/2017, -1.5   Allergies  Allergen Reactions  . Sulfa Antibiotics Hives    Current Outpatient Medications  Medication Sig Dispense Refill  . anastrozole (ARIMIDEX) 1 MG tablet Take 1 tablet (1 mg total) by mouth daily. 90 tablet 4  . calcium carbonate (OS-CAL) 600 MG TABS Take 600 mg by mouth 2 (two) times daily with a meal.    . Cholecalciferol (VITAMIN D) 400 UNITS capsule Take 800 Units by mouth daily.    Marland Kitchen  doxycycline (VIBRA-TABS) 100 MG tablet Take 1 tablet (100 mg total) by mouth 2 (two) times daily. 2 tablet 0  . escitalopram (LEXAPRO) 20 MG tablet TAKE 1 TABLET BY MOUTH EVERY DAY 90 tablet 0  . hyoscyamine (LEVSIN SL) 0.125 MG SL tablet TAKE I CAPSULE BY MOUTH DAILY AS NEEDED 30 tablet 1  . omeprazole (PRILOSEC) 40 MG capsule TAKE 1 CAPSULE BY MOUTH EVERY DAY 90 capsule 1  . Rimegepant Sulfate (NURTEC) 75 MG TBDP Take 1 tablet by mouth every other day. 16 tablet 11  . SUMAtriptan (IMITREX) 100 MG tablet TAKE 1 TABLET BY MOUTH EVERY 2 HOURS AS NEEDED MAY REPEAT IN 2 HOURS IF HEADACHE PERSISTS 9 tablet 12  . vitamin C (ASCORBIC ACID) 250 MG tablet Take 500 mg by mouth daily.     No current facility-administered medications for this visit.    OBJECTIVE: White woman in no acute distress  Vitals:   09/02/20 1025  BP: (!) 125/59  Pulse: 78  Resp: 18  Temp: 98.4 F (36.9 C)  SpO2: 98%     Body mass index is 29.47 kg/m.   Wt Readings from Last 3 Encounters:  09/02/20 179  lb 12.8 oz (81.6 kg)  07/07/20 182 lb 3.2 oz (82.6 kg)  04/16/20 176 lb 6.4 oz (80 kg)      ECOG FS:1 - Symptomatic but completely ambulatory  Sclerae unicteric, EOMs intact Wearing a mask No cervical or supraclavicular adenopathy Lungs no rales or rhonchi Heart regular rate and rhythm Abd soft, nontender, positive bowel sounds MSK no focal spinal tenderness, no upper extremity lymphedema Neuro: nonfocal, well oriented, appropriate affect Breasts: The right breast is unremarkable.  The left breast is status postlumpectomy and radiation.  The cosmetic result is very good.  There is no evidence of residual or recurrent disease.  Both axillae are benign.     LAB RESULTS:  CMP     Component Value Date/Time   NA 141 04/16/2020 1232   NA 142 05/15/2019 1034   K 3.7 04/16/2020 1232   CL 106 04/16/2020 1232   CO2 26 04/16/2020 1232   GLUCOSE 91 04/16/2020 1232   BUN 13 04/16/2020 1232   BUN 13 05/15/2019 1034   CREATININE 0.87 04/16/2020 1232   CREATININE 0.88 02/06/2020 0834   CREATININE 0.79 10/11/2012 1527   CALCIUM 9.5 04/16/2020 1232   PROT 7.2 04/16/2020 1232   PROT 6.8 05/15/2019 1034   ALBUMIN 4.2 04/16/2020 1232   ALBUMIN 4.4 05/15/2019 1034   AST 20 04/16/2020 1232   AST 18 02/06/2020 0834   ALT 26 04/16/2020 1232   ALT 21 02/06/2020 0834   ALKPHOS 107 04/16/2020 1232   BILITOT 0.6 04/16/2020 1232   BILITOT 0.5 02/06/2020 0834   GFRNONAA >60 04/16/2020 1232   GFRNONAA >60 02/06/2020 0834   GFRAA >60 07/09/2019 0308    No results found for: TOTALPROTELP, ALBUMINELP, A1GS, A2GS, BETS, BETA2SER, GAMS, MSPIKE, SPEI  Lab Results  Component Value Date   WBC 5.7 09/02/2020   NEUTROABS 4.1 09/02/2020   HGB 12.4 09/02/2020   HCT 35.5 (L) 09/02/2020   MCV 94.9 09/02/2020   PLT 234 09/02/2020    No results found for: LABCA2  No components found for: GOTLXB262  No results for input(s): INR in the last 168 hours.  No results found for: LABCA2  No results  found for: MBT597  No results found for: CBU384  No results found for: TXM468  No results found for: EH2122  No components found for: HGQUANT  No results found for: CEA1 / No results found for: CEA1   No results found for: AFPTUMOR  No results found for: CHROMOGRNA  No results found for: KPAFRELGTCHN, LAMBDASER, KAPLAMBRATIO (kappa/lambda light chains)  No results found for: HGBA, HGBA2QUANT, HGBFQUANT, HGBSQUAN (Hemoglobinopathy evaluation)   No results found for: LDH  No results found for: IRON, TIBC, IRONPCTSAT (Iron and TIBC)  No results found for: FERRITIN  Urinalysis    Component Value Date/Time   COLORURINE YELLOW 12/19/2015 1217   APPEARANCEUR Clear 10/18/2016 0859   LABSPEC 1.001 12/19/2015 1217   PHURINE 6.0 12/19/2015 1217   GLUCOSEU Negative 10/18/2016 0859   HGBUR NEGATIVE 12/19/2015 1217   BILIRUBINUR Negative 10/18/2016 0859   KETONESUR NEGATIVE 12/19/2015 1217   PROTEINUR Negative 10/18/2016 0859   PROTEINUR NEGATIVE 12/19/2015 1217   UROBILINOGEN 0.2 10/12/2013 1545   NITRITE Negative 10/18/2016 0859   NITRITE NEGATIVE 12/19/2015 1217   LEUKOCYTESUR 1+ (A) 10/18/2016 0859    STUDIES: MM DIAG BREAST TOMO BILATERAL  Result Date: 08/26/2020 CLINICAL DATA:  64 year old female with history left breast cancer in 2021 status post lumpectomy and radiation. Patient presents for annual exam. No new problems. EXAM: DIGITAL DIAGNOSTIC BILATERAL MAMMOGRAM WITH TOMOSYNTHESIS AND CAD TECHNIQUE: Bilateral digital diagnostic mammography and breast tomosynthesis was performed. The images were evaluated with computer-aided detection. COMPARISON:  Previous exam(s). ACR Breast Density Category c: The breast tissue is heterogeneously dense, which may obscure small masses. FINDINGS: Mammogram: Right breast: No suspicious mass, distortion, or microcalcifications are identified to suggest presence of malignancy. Left breast: A spot 2D magnification view of the lumpectomy  site was performed in addition to standard views. There are expected postsurgical changes. There is diffuse skin thickening consistent with prior radiation. No suspicious mass, distortion, or microcalcifications are identified to suggest presence of malignancy. IMPRESSION: Postsurgical changes in the left breast. No mammographic evidence of malignancy bilaterally. RECOMMENDATION: Diagnostic bilateral mammogram in 1 year. I have discussed the findings and recommendations with the patient. If applicable, a reminder letter will be sent to the patient regarding the next appointment. BI-RADS CATEGORY  2: Benign. Electronically Signed   By: Audie Pinto M.D.   On: 08/26/2020 11:49     ELIGIBLE FOR AVAILABLE RESEARCH PROTOCOL: AET  ASSESSMENT: 65 y.o. [place] woman status post left breast upper outer quadrant biopsy 01/28/2020 for a clinical T1a/b N0, stage IA invasive lobular carcinoma, E-cadherin negative, estrogen and progesterone receptor positive, HER-2 not amplified, with an MIB-1 of 10%.  (1) status post left lumpectomy and sentinel lymph node sampling 02/14/2020 for a pT1c pN0, stage IA invasive lobular carcinoma, grade 2, with negative margins  (a) 2 left axillary lymph nodes were removed  (2) Oncotype score of 16 predicts a risk of recurrence outside the breast in the next 9 years of 4% if the patient's only systemic therapy is antiestrogens for 5 years.  It also predicts no benefit from chemotherapy  (3) adjuvant radiation: Radiation Treatment Dates: 04/08/2020 through 05/05/2020 Site Technique Total Dose (Gy) Dose per Fx (Gy) Completed Fx Beam Energies  Breast, Left: Breast_Lt 3D 42.56/42.56 2.66 16/16 6X, 10X  Breast, Left: Breast_Lt_Bst 3D 8/8 2 4/4 6X, 10X   (4) started anastrozole 05/19/2020  (a) bone density 07/07/2020 shows a T score of -1.4 (minimal osteopenia) mid June 2023, labs same day   PLAN: Carolyn Cooper is now just over 6 months out from definitive surgery for her breast  cancer.  She is tolerating anastrozole remarkably well and  the plan will be to continue that a total of 5 years.  She had an appointment with her surgeon Dr. Brantley Stage next week but I think it would be best to move that to November.  That way we could see each see her on a once a year basis at an appropriate 57-monthinterval  I did offer her participation in our pelvic rehab program.  At this point she declines.  We discussed the results of her bone density, which showed minimal osteopenia.  She has some hip issues which keep her from weightbearing exercise as much as I would like but I encouraged walking as much as she can do it.  She will continue of course on vitamin D supplementation  I have set her up for a repeat visit in 1 year.  She knows to call for any other issue that may develop before then  Total encounter time 25 minutes.*Sarajane JewsC. Kristeen Lantz, MD 09/02/2020 10:27 AM [facility] [id number]. [phone]   Fax. [phone]  This document serves as a record of services personally performed by GLurline Del MD. It was created on his behalf by KWilburn Mylar a trained medical scribe. The creation of this record is based on the scribe's personal observations and the provider's statements to them.   I, GLurline DelMD, have reviewed the above documentation for accuracy and completeness, and I agree with the above.   *Total Encounter Time as defined by the Centers for Medicare and Medicaid Services includes, in addition to the face-to-face time of a patient visit (documented in the note above) non-face-to-face time: obtaining and reviewing outside history, ordering and reviewing medications, tests or procedures, care coordination (communications with other health care professionals or caregivers) and documentation in the medical record.

## 2020-09-02 ENCOUNTER — Inpatient Hospital Stay: Payer: 59 | Attending: Oncology | Admitting: Oncology

## 2020-09-02 ENCOUNTER — Other Ambulatory Visit: Payer: Self-pay

## 2020-09-02 ENCOUNTER — Telehealth: Payer: Self-pay | Admitting: Oncology

## 2020-09-02 ENCOUNTER — Inpatient Hospital Stay: Payer: 59

## 2020-09-02 VITALS — BP 125/59 | HR 78 | Temp 98.4°F | Resp 18 | Ht 65.5 in | Wt 179.8 lb

## 2020-09-02 DIAGNOSIS — Z923 Personal history of irradiation: Secondary | ICD-10-CM | POA: Insufficient documentation

## 2020-09-02 DIAGNOSIS — C50412 Malignant neoplasm of upper-outer quadrant of left female breast: Secondary | ICD-10-CM

## 2020-09-02 DIAGNOSIS — M8580 Other specified disorders of bone density and structure, unspecified site: Secondary | ICD-10-CM | POA: Insufficient documentation

## 2020-09-02 DIAGNOSIS — Z170 Estrogen receptor positive status [ER+]: Secondary | ICD-10-CM | POA: Insufficient documentation

## 2020-09-02 DIAGNOSIS — N898 Other specified noninflammatory disorders of vagina: Secondary | ICD-10-CM | POA: Diagnosis not present

## 2020-09-02 LAB — CBC WITH DIFFERENTIAL/PLATELET
Abs Immature Granulocytes: 0.02 10*3/uL (ref 0.00–0.07)
Basophils Absolute: 0 10*3/uL (ref 0.0–0.1)
Basophils Relative: 1 %
Eosinophils Absolute: 0.1 10*3/uL (ref 0.0–0.5)
Eosinophils Relative: 2 %
HCT: 35.5 % — ABNORMAL LOW (ref 36.0–46.0)
Hemoglobin: 12.4 g/dL (ref 12.0–15.0)
Immature Granulocytes: 0 %
Lymphocytes Relative: 16 %
Lymphs Abs: 0.9 10*3/uL (ref 0.7–4.0)
MCH: 33.2 pg (ref 26.0–34.0)
MCHC: 34.9 g/dL (ref 30.0–36.0)
MCV: 94.9 fL (ref 80.0–100.0)
Monocytes Absolute: 0.5 10*3/uL (ref 0.1–1.0)
Monocytes Relative: 9 %
Neutro Abs: 4.1 10*3/uL (ref 1.7–7.7)
Neutrophils Relative %: 72 %
Platelets: 234 10*3/uL (ref 150–400)
RBC: 3.74 MIL/uL — ABNORMAL LOW (ref 3.87–5.11)
RDW: 12.3 % (ref 11.5–15.5)
WBC: 5.7 10*3/uL (ref 4.0–10.5)
nRBC: 0 % (ref 0.0–0.2)

## 2020-09-02 LAB — COMPREHENSIVE METABOLIC PANEL
ALT: 11 U/L (ref 0–44)
AST: 16 U/L (ref 15–41)
Albumin: 4 g/dL (ref 3.5–5.0)
Alkaline Phosphatase: 110 U/L (ref 38–126)
Anion gap: 11 (ref 5–15)
BUN: 15 mg/dL (ref 8–23)
CO2: 23 mmol/L (ref 22–32)
Calcium: 9.2 mg/dL (ref 8.9–10.3)
Chloride: 106 mmol/L (ref 98–111)
Creatinine, Ser: 0.85 mg/dL (ref 0.44–1.00)
GFR, Estimated: >60 mL/min (ref >60)
Glucose, Bld: 95 mg/dL (ref 70–99)
Potassium: 3.8 mmol/L (ref 3.5–5.1)
Sodium: 140 mmol/L (ref 135–145)
Total Bilirubin: 0.4 mg/dL (ref 0.3–1.2)
Total Protein: 6.8 g/dL (ref 6.5–8.1)

## 2020-09-02 MED ORDER — ANASTROZOLE 1 MG PO TABS: 1.0000 mg | ORAL_TABLET | Freq: Every day | ORAL | 4 refills | Status: DC

## 2020-09-02 NOTE — Telephone Encounter (Signed)
Schedule appointment per 05/31los. Patient is aware.

## 2020-09-19 ENCOUNTER — Other Ambulatory Visit: Payer: Self-pay | Admitting: Family Medicine

## 2020-09-19 DIAGNOSIS — G43709 Chronic migraine without aura, not intractable, without status migrainosus: Secondary | ICD-10-CM

## 2020-10-07 ENCOUNTER — Other Ambulatory Visit: Payer: Self-pay | Admitting: Family Medicine

## 2020-10-07 DIAGNOSIS — M7062 Trochanteric bursitis, left hip: Secondary | ICD-10-CM

## 2020-10-18 ENCOUNTER — Other Ambulatory Visit: Payer: Self-pay | Admitting: Family Medicine

## 2020-10-18 DIAGNOSIS — F419 Anxiety disorder, unspecified: Secondary | ICD-10-CM

## 2020-10-24 ENCOUNTER — Other Ambulatory Visit: Payer: Self-pay | Admitting: Family Medicine

## 2020-10-24 ENCOUNTER — Telehealth: Payer: Self-pay | Admitting: *Deleted

## 2020-10-24 DIAGNOSIS — G43709 Chronic migraine without aura, not intractable, without status migrainosus: Secondary | ICD-10-CM

## 2020-10-24 NOTE — Telephone Encounter (Signed)
(  Key: [id number]) Nurtec '75MG'$  dispersible tablets Wait for Determination Please wait for [employer] NCPDP 2017 to return a determination. Sent to plan

## 2020-10-24 NOTE — Telephone Encounter (Signed)
Your PA request has been approved. Additional information will be provided in the approval communication. (Message [phone]) [facility] aware

## 2020-11-21 ENCOUNTER — Other Ambulatory Visit: Payer: Self-pay | Admitting: Gastroenterology

## 2020-11-21 ENCOUNTER — Other Ambulatory Visit: Payer: Self-pay | Admitting: Family Medicine

## 2020-11-21 DIAGNOSIS — G43709 Chronic migraine without aura, not intractable, without status migrainosus: Secondary | ICD-10-CM

## 2020-11-21 DIAGNOSIS — F419 Anxiety disorder, unspecified: Secondary | ICD-10-CM

## 2020-12-15 ENCOUNTER — Other Ambulatory Visit: Payer: Self-pay | Admitting: Family Medicine

## 2020-12-15 DIAGNOSIS — F419 Anxiety disorder, unspecified: Secondary | ICD-10-CM

## 2020-12-15 DIAGNOSIS — G43709 Chronic migraine without aura, not intractable, without status migrainosus: Secondary | ICD-10-CM

## 2020-12-31 ENCOUNTER — Other Ambulatory Visit: Payer: Self-pay | Admitting: Family Medicine

## 2020-12-31 DIAGNOSIS — G43709 Chronic migraine without aura, not intractable, without status migrainosus: Secondary | ICD-10-CM

## 2021-01-27 ENCOUNTER — Ambulatory Visit: Payer: 59 | Admitting: Nurse Practitioner

## 2021-01-30 ENCOUNTER — Ambulatory Visit: Payer: 59 | Admitting: Nurse Practitioner

## 2021-03-02 ENCOUNTER — Encounter: Payer: Self-pay | Admitting: Family Medicine

## 2021-03-09 ENCOUNTER — Ambulatory Visit: Payer: 59 | Admitting: Nurse Practitioner

## 2021-03-11 ENCOUNTER — Ambulatory Visit (INDEPENDENT_AMBULATORY_CARE_PROVIDER_SITE_OTHER): Payer: 59 | Admitting: Dermatology

## 2021-03-11 ENCOUNTER — Other Ambulatory Visit: Payer: Self-pay

## 2021-03-11 ENCOUNTER — Encounter: Payer: Self-pay | Admitting: Dermatology

## 2021-03-11 DIAGNOSIS — L720 Epidermal cyst: Secondary | ICD-10-CM

## 2021-03-11 DIAGNOSIS — C44722 Squamous cell carcinoma of skin of right lower limb, including hip: Secondary | ICD-10-CM | POA: Diagnosis not present

## 2021-03-11 DIAGNOSIS — Z1283 Encounter for screening for malignant neoplasm of skin: Secondary | ICD-10-CM

## 2021-03-11 DIAGNOSIS — L821 Other seborrheic keratosis: Secondary | ICD-10-CM

## 2021-03-11 DIAGNOSIS — D1800 Hemangioma unspecified site: Secondary | ICD-10-CM | POA: Diagnosis not present

## 2021-03-11 DIAGNOSIS — D485 Neoplasm of uncertain behavior of skin: Secondary | ICD-10-CM

## 2021-03-11 NOTE — Patient Instructions (Signed)

## 2021-03-17 ENCOUNTER — Other Ambulatory Visit: Payer: Self-pay | Admitting: Family Medicine

## 2021-03-17 DIAGNOSIS — G43709 Chronic migraine without aura, not intractable, without status migrainosus: Secondary | ICD-10-CM

## 2021-03-18 ENCOUNTER — Telehealth: Payer: Self-pay | Admitting: *Deleted

## 2021-03-18 ENCOUNTER — Telehealth: Payer: Self-pay | Admitting: Dermatology

## 2021-03-18 ENCOUNTER — Ambulatory Visit: Payer: 59 | Admitting: Family Medicine

## 2021-03-18 VITALS — BP 136/87 | HR 72 | Temp 98.0°F | Ht 65.5 in | Wt 144.2 lb

## 2021-03-18 DIAGNOSIS — M7071 Other bursitis of hip, right hip: Secondary | ICD-10-CM

## 2021-03-18 DIAGNOSIS — G43709 Chronic migraine without aura, not intractable, without status migrainosus: Secondary | ICD-10-CM

## 2021-03-18 DIAGNOSIS — F419 Anxiety disorder, unspecified: Secondary | ICD-10-CM | POA: Diagnosis not present

## 2021-03-18 DIAGNOSIS — M7072 Other bursitis of hip, left hip: Secondary | ICD-10-CM

## 2021-03-18 MED ORDER — METHYLPREDNISOLONE ACETATE 40 MG/ML IJ SUSP
40.0000 mg | Freq: Once | INTRAMUSCULAR | Status: AC
Start: 2021-03-18 — End: 2021-03-18
  Administered 2021-03-18: 12:00:00 40 mg via INTRAMUSCULAR

## 2021-03-18 MED ORDER — ESCITALOPRAM OXALATE 20 MG PO TABS: 20.0000 mg | ORAL_TABLET | Freq: Every day | ORAL | 0 refills | Status: DC

## 2021-03-18 MED ORDER — SUMATRIPTAN SUCCINATE 100 MG PO TABS: ORAL_TABLET | ORAL | 12 refills | Status: DC

## 2021-03-18 MED ORDER — METHYLPREDNISOLONE ACETATE 40 MG/ML IJ SUSP
40.0000 mg | Freq: Once | INTRAMUSCULAR | Status: AC
  Administered 2021-03-18: 12:00:00 40 mg via INTRAMUSCULAR

## 2021-03-18 MED ORDER — ESCITALOPRAM OXALATE 20 MG PO TABS: 20.0000 mg | ORAL_TABLET | Freq: Every day | ORAL | 3 refills | Status: DC

## 2021-03-18 NOTE — Telephone Encounter (Signed)
Left message for patient to return our phone call.

## 2021-03-18 NOTE — Telephone Encounter (Signed)
Biopsy results, 12/7, ST

## 2021-03-18 NOTE — Progress Notes (Signed)
Subjective: CC: Bilateral bursitis of hips PCP: Janora Norlander, DO SAY:TKZSW Carolyn Cooper is a 65 y.o. female presenting to clinic today for:  1.  Hip bursitis Patient reports that this has been a chronic issue for her.  She has required corticosteroid injections into the hips previously which have been helpful.  Today to have these done again.  She notes that she really does not have much pain ambulating but she does have pain when she rolls over onto her side at night.  She has been utilizing Motrin but worries about excessive utilization of this medicine.  No reports of GI bleeding.   ROS: Per HPI  Allergies  Allergen Reactions   Sulfa Antibiotics Hives   Past Medical History:  Diagnosis Date   Allergy    seasonal   Anxiety    Barrett's esophagus    Breast cancer ([facility]) 01/2020   left breast ILC   Cataract    had surgery   Depression    Family history of breast cancer    Family history of kidney cancer    Family history of ovarian cancer    Family history of stomach cancer    GERD (gastroesophageal reflux disease)    IBS (irritable bowel syndrome)    Migraines    Osteopenia 2019   T score -1.5 FRAX 8% / 0.7%    Current Outpatient Medications:    anastrozole (ARIMIDEX) 1 MG tablet, Take 1 tablet (1 mg total) by mouth daily., Disp: 90 tablet, Rfl: 4   Cholecalciferol (VITAMIN D) 400 UNITS capsule, Take 800 Units by mouth daily., Disp: , Rfl:    NURTEC 75 MG TBDP, TAKE 1 TABLET BY MOUTH DAILY AS NEEDED (MIGRAINE)., Disp: 16 tablet, Rfl: 3   omeprazole (PRILOSEC) 40 MG capsule, TAKE 1 CAPSULE BY MOUTH EVERY DAY, Disp: 90 capsule, Rfl: 1   escitalopram (LEXAPRO) 20 MG tablet, Take 1 tablet (20 mg total) by mouth daily., Disp: 90 tablet, Rfl: 0   SUMAtriptan (IMITREX) 100 MG tablet, May repeat in 2 hours if headache persists or recurs., Disp: 9 tablet, Rfl: 12 Social History   Socioeconomic History   Marital status: Married    Spouse name: Not on file   Number  of children: Not on file   Years of education: Not on file   Highest education level: Not on file  Occupational History   Not on file  Tobacco Use   Smoking status: Never   Smokeless tobacco: Never  Vaping Use   Vaping Use: Never used  Substance and Sexual Activity   Alcohol use: Not Currently    Alcohol/week: 0.0 standard drinks   Drug use: No   Sexual activity: Yes    Birth control/protection: Post-menopausal    Comment: 1st intercourse 65 yo-Fewer than 5 partners  Other Topics Concern   Not on file  Social History Narrative   Not on file   Social Determinants of Health   Financial Resource Strain: Not on file  Food Insecurity: Not on file  Transportation Needs: Not on file  Physical Activity: Not on file  Stress: Not on file  Social Connections: Not on file  Intimate Partner Violence: Not on file   Family History  Problem Relation Age of Onset   Ovarian cancer Maternal Grandmother 51   Diverticulitis Mother    Heart disease Father    Kidney cancer Father 33   Stomach cancer Maternal Grandfather        dx 27s   Breast  cancer Other        dx >50, maternal great-aunt   Breast cancer Other        dx >50, maternal great-aunt   Breast cancer Other        dx >50, maternal great-aunt   Colon cancer Neg Hx    Esophageal cancer Neg Hx    Rectal cancer Neg Hx     Objective: Office vital signs reviewed. BP 136/87    Pulse 72    Temp 98 F (36.7 C)    Ht 5' 5.5" (1.664 m)    Wt 144 lb 3.2 oz (65.4 kg)    SpO2 97%    BMI 23.63 kg/m   Physical Examination:  General: Awake, alert, well nourished, No acute distress MSK: Ambulating independently.  She has point tenderness over the left and right trochanteric bursa's.  She has a palpable knot appreciated the area of pain on the left that is approximately pea-sized.  Bursa INJECTION:  Patient denies allergy to antiseptics (including iodine) and anesthetics.  Patient denies h/o diabetes, frequent steroid use, use of blood  thinners/ antiplatelets.  Patient was given informed consent and a signed copy has been placed in the chart. Appropriate time out was taken. Area prepped and draped in usual sterile fashion. Anatomic landmarks were identified and injection site was marked.  Ethyl chloride spray was used to numb the area and 1 cc of methylprednisolone 40 mg/ml plus  2 cc of 1% lidocaine without epinephrine was injected into the left trochanteric bursa. The patient tolerated the procedure well and there were no immediate complications. Estimated blood loss is less than 1 cc.  Post procedure instructions were reviewed and handout outlining these instructions were provided to patient.  Bursa INJECTION:  Patient denies allergy to antiseptics (including iodine) and anesthetics.  Patient Denies h/o diabetes, frequent steroid use, use of blood thinners/ antiplatelets.  Patient was given informed consent and a signed copy has been placed in the chart. Appropriate time out was taken. Area prepped and draped in usual sterile fashion. Anatomic landmarks were identified and injection site was marked.  Ethyl chloride spray was used to numb the area and 1 cc of methylprednisolone 40 mg/ml plus  2 cc of 1% lidocaine without epinephrine was injected into the right trochanteric bursa. The patient tolerated the procedure well and there were no immediate complications. Estimated blood loss is less than 1 cc.  Post procedure instructions were reviewed and handout outlining these instructions were provided to patient.    Assessment/ Plan: 65 y.o. female   Bursitis of both hips, unspecified bursa - Plan: methylPREDNISolone acetate (DEPO-MEDROL) injection 40 mg, methylPREDNISolone acetate (DEPO-MEDROL) injection 40 mg  Chronic migraine without aura without status migrainosus, not intractable - Plan: SUMAtriptan (IMITREX) 100 MG tablet  Anxiety - Plan: escitalopram (LEXAPRO) 20 MG tablet, DISCONTINUED: escitalopram (LEXAPRO) 20 MG  tablet  Treated bursitis today with corticosteroid injections bilaterally.  Home care instructions reviewed and reasons for return discussed.  She will follow-up as needed on that issue  Did not discuss migraine nor anxiety today but needed refills.  These have been sent  No orders of the defined types were placed in this encounter.  Meds ordered this encounter  Medications   DISCONTD: escitalopram (LEXAPRO) 20 MG tablet    Sig: Take 1 tablet (20 mg total) by mouth daily.    Dispense:  90 tablet    Refill:  0   SUMAtriptan (IMITREX) 100 MG tablet    Sig: May  repeat in 2 hours if headache persists or recurs.    Dispense:  9 tablet    Refill:  12   methylPREDNISolone acetate (DEPO-MEDROL) injection 40 mg   methylPREDNISolone acetate (DEPO-MEDROL) injection 40 mg   escitalopram (LEXAPRO) 20 MG tablet    Sig: Take 1 tablet (20 mg total) by mouth daily.    Dispense:  90 tablet    Refill:  [place], [place] [phone]

## 2021-03-18 NOTE — Telephone Encounter (Signed)
-----   Message from Lavonna Monarch, MD sent at 03/18/2021  5:11 AM EST ----- Schedule surgery with Dr. Darene Lamer

## 2021-03-18 NOTE — Telephone Encounter (Signed)
Pathology to patient-surgery appointment scheduled.  

## 2021-03-30 ENCOUNTER — Encounter: Payer: Self-pay | Admitting: Dermatology

## 2021-03-30 NOTE — Progress Notes (Signed)
° °  Follow-Up Visit   Subjective  Carolyn Cooper is a 65 y.o. female who presents for the following: Annual Exam (Right leg- new spot - scabs and bleeds).  Annual examination, new spot right leg has bled Location:  Duration:  Quality:  Associated Signs/Symptoms: Modifying Factors:  Severity:  Timing: Context:   Objective  Well appearing patient in no apparent distress; mood and affect are within normal limits. Left Shoulder - Anterior 1 mm upper dermal hard white papule  Torso - Posterior (Back) Legs plus waist examination: No atypical pigmented lesions.  1 possible nonmelanoma skin cancer leg will be biopsied.  Right Lower Leg - Anterior tan textured 4 mm flattopped papule, compatible dermoscopy  Left Inguinal Area Smooth violet colored 3 mm dermal papule  Right Lower Leg - Anterior         A full examination was performed including scalp, head, eyes, ears, nose, lips, neck, chest, axillae, abdomen, back, buttocks, bilateral upper extremities, bilateral lower extremities, hands, feet, fingers, toes, fingernails, and toenails. All findings within normal limits unless otherwise noted below.  Areas beneath undergarments not fully examined.   Assessment & Plan    [place] Left Shoulder - Anterior  No intervention necessary  Encounter for screening for malignant neoplasm of skin Torso - Posterior (Back)  Annual skin examination  Seborrheic keratosis Right Lower Leg - Anterior  Leave if stable  Hemangioma, unspecified site Left Inguinal Area  Neoplasm of uncertain behavior of skin Right Lower Leg - Anterior  Skin / nail biopsy Type of biopsy: tangential   Informed consent: discussed and consent obtained   Timeout: patient name, date of birth, surgical site, and procedure verified   Procedure prep:  Patient was prepped and draped in usual sterile fashion (Non sterile) Prep type:  Chlorhexidine Anesthesia: the lesion was anesthetized in a standard  fashion   Anesthetic:  1% lidocaine w/ epinephrine 1-100,000 local infiltration Instrument used: flexible razor blade   Outcome: patient tolerated procedure well   Post-procedure details: wound care instructions given    Specimen 1 - Surgical pathology Differential Diagnosis: r/o atypia  Check Margins: No      I, Lavonna Monarch, MD, have reviewed all documentation for this visit.  The documentation on 03/30/21 for the exam, diagnosis, procedures, and orders are all accurate and complete.

## 2021-05-04 NOTE — Progress Notes (Signed)
° °  Carolyn Cooper [birth year] [id number]   History:  66 y.o. G4P4 presents for annual exam. Postmenopausal - no HRT, no bleeding. 1996 cone biopsy, otherwise normal pap history. 01/2020 ER + left breast cancer managed with lumpectomy, radiation, and aromatase inhibitor.   Gynecologic History No LMP recorded. Patient is postmenopausal.   Contraception: post menopausal status Sexually active: Yes  Health maintenance Last Pap: 01/05/2018. Results were: Normal, 5-year repeat Last mammogram: 08/26/2020. Results were: Postsurgical changes, negative for malignancy Last colonoscopy: 10/24/2017. Results were: Benign polyps, 5-year recall Last Dexa: 07/07/2020. Results were: T-score -1.4, FRAX 8.3% / 0.8%  Past medical history, past surgical history, family history and social history were all reviewed and documented in the EPIC chart. Married. Husband had multiple myeloma, doing well. Retired. 4 children, 4 grandchildren ages 40, 2, 60, and 16. All live local.   ROS:  A ROS was performed and pertinent positives and negatives are included.  Exam:  Vitals:   05/05/21 0936  BP: 124/76  Weight: 145 lb (65.8 kg)  Height: $Remove'5\' 5"'JdGnsjb$  (1.651 m)    Body mass index is 24.13 kg/m.  General appearance:  Normal Thyroid:  Symmetrical, normal in size, without palpable masses or nodularity. Respiratory  Auscultation:  Clear without wheezing or rhonchi Cardiovascular  Auscultation:  Regular rate, without rubs, murmurs or gallops  Edema/varicosities:  Not grossly evident Abdominal  Soft,nontender, without masses, guarding or rebound.  Liver/spleen:  No organomegaly noted  Hernia:  None appreciated  Skin  Inspection:  Grossly normal   Breasts: Examined lying and sitting.   Right: Without masses, retractions, discharge or axillary adenopathy.   Left: Without masses, retractions, discharge or axillary adenopathy. Genitourinary   Inguinal/mons:  Normal without inguinal adenopathy  External genitalia:   Normal appearing vulva with no masses, tenderness, or lesions  BUS/Urethra/Skene's glands:  Normal  Vagina:  Normal appearing with normal color and discharge, no lesions  Cervix:  Normal appearing without discharge or lesions  Uterus:  Normal in size, shape and contour.  Midline and mobile, nontender  Adnexa/parametria:     Rt: Normal in size, without masses or tenderness.   Lt: Normal in size, without masses or tenderness.  Anus and perineum: Non-bleeding hemorrhoids  Digital rectal exam: Normal sphincter tone without palpated masses or tenderness  Patient informed chaperone available to be present for breast and pelvic exam. Patient has requested no chaperone to be present. Patient has been advised what will be completed during breast and pelvic exam.   Assessment/Plan:  66 y.o. G4P4 for annual exam.   Well female exam with routine gynecological exam - Education provided on SBEs, importance of preventative screenings, current guidelines, high calcium diet, regular exercise, and multivitamin daily.  Labs with PCP.   Postmenopausal - no HRT, no bleeding  Osteopenia of neck of left femur - T-score -1.4 without elevated FRAX. Continue Vitamin D + Calcium and increase exercise. Will plan to repeat DXA next year.   History of left breast cancer - 01/2020 ER+ left breast cancer managed with lumpectomy, radiation, and aromatase inhibitor. Seeing oncology. Continue annual screenings. Normal mammogram 08/2020. Normal breast exam today.   Screening for cervical cancer -1996 cone ciopsy, otherwise normal palpation.  Will repeat at 5-year interval per guidelines.  Screening for colon cancer - 2019 colonoscopy. Will repeat at 5-year interval per GI's recommendation.   Follow up in 1 year for annual.      Tamela Gammon [facility], 10:08 AM 05/05/2021

## 2021-05-05 ENCOUNTER — Encounter: Payer: Self-pay | Admitting: Nurse Practitioner

## 2021-05-05 ENCOUNTER — Ambulatory Visit (INDEPENDENT_AMBULATORY_CARE_PROVIDER_SITE_OTHER): Payer: 59 | Admitting: Nurse Practitioner

## 2021-05-05 ENCOUNTER — Other Ambulatory Visit: Payer: Self-pay

## 2021-05-05 VITALS — BP 124/76 | Ht 65.0 in | Wt 145.0 lb

## 2021-05-05 DIAGNOSIS — Z780 Asymptomatic menopausal state: Secondary | ICD-10-CM

## 2021-05-05 DIAGNOSIS — M85852 Other specified disorders of bone density and structure, left thigh: Secondary | ICD-10-CM

## 2021-05-05 DIAGNOSIS — Z853 Personal history of malignant neoplasm of breast: Secondary | ICD-10-CM

## 2021-05-05 DIAGNOSIS — Z01419 Encounter for gynecological examination (general) (routine) without abnormal findings: Secondary | ICD-10-CM | POA: Diagnosis not present

## 2021-05-14 ENCOUNTER — Ambulatory Visit (INDEPENDENT_AMBULATORY_CARE_PROVIDER_SITE_OTHER): Payer: 59 | Admitting: Dermatology

## 2021-05-14 ENCOUNTER — Other Ambulatory Visit: Payer: Self-pay

## 2021-05-14 DIAGNOSIS — C44722 Squamous cell carcinoma of skin of right lower limb, including hip: Secondary | ICD-10-CM | POA: Diagnosis not present

## 2021-05-14 MED ORDER — MUPIROCIN 2 % EX OINT: 1.0000 "application " | TOPICAL_OINTMENT | Freq: Two times a day (BID) | CUTANEOUS | 0 refills | Status: DC

## 2021-05-14 NOTE — Patient Instructions (Signed)

## 2021-05-19 ENCOUNTER — Encounter (HOSPITAL_COMMUNITY): Payer: Self-pay

## 2021-05-26 ENCOUNTER — Encounter: Payer: Self-pay | Admitting: Dermatology

## 2021-05-27 ENCOUNTER — Encounter: Payer: Self-pay | Admitting: Family Medicine

## 2021-06-03 ENCOUNTER — Encounter: Payer: Self-pay | Admitting: Dermatology

## 2021-06-03 NOTE — Progress Notes (Signed)
° °  Follow-Up Visit   Subjective  Carolyn Cooper Garlene Apperson is a 66 y.o. female who presents for the following: Procedure (Patient here today for treatment of SCC x 1 on right lower leg anterior).  Biopsy-proven SCCA right leg Location:  Duration:  Quality:  Associated Signs/Symptoms: Modifying Factors:  Severity:  Timing: Context:   Objective  Well appearing patient in no apparent distress; mood and affect are within normal limits. Right Lower Leg - Anterior Lesion identified by Dr.Yarelis Ambrosino and nurse in room.      A focused examination was performed including legs. Relevant physical exam findings are noted in the Assessment and Plan.   Assessment & Plan    Squamous cell carcinoma of skin of right lower limb, including hip Right Lower Leg - Anterior  Destruction of lesion Complexity: simple   Destruction method: electrodesiccation and curettage   Informed consent: discussed and consent obtained   Timeout:  patient name, date of birth, surgical site, and procedure verified Anesthesia: the lesion was anesthetized in a standard fashion   Anesthetic:  1% lidocaine w/ epinephrine 1-100,000 local infiltration Curettage performed in three different directions: Yes   Curettage cycles:  3 Lesion length (cm):  1.5 Lesion width (cm):  1.5 Margin per side (cm):  0 Final wound size (cm):  1.5 Hemostasis achieved with:  ferric subsulfate Outcome: patient tolerated procedure well with no complications   Post-procedure details: sterile dressing applied and wound care instructions given   Dressing type: bandage and petrolatum   Additional details:  Wound inoculated with 5% Fluorouracil.    Curettage showed that this was more wide than deep so repeat curettage and cautery performed.  This inoculated with parenteral fluorouracil.  Related Medications mupirocin ointment (BACTROBAN) 2 % Apply 1 application topically 2 (two) times daily.      I, Lavonna Monarch, MD, have reviewed all  documentation for this visit.  The documentation on 06/03/21 for the exam, diagnosis, procedures, and orders are all accurate and complete.

## 2021-06-24 ENCOUNTER — Other Ambulatory Visit: Payer: Self-pay | Admitting: *Deleted

## 2021-06-24 ENCOUNTER — Other Ambulatory Visit: Payer: Self-pay | Admitting: Gastroenterology

## 2021-06-24 MED ORDER — ANASTROZOLE 1 MG PO TABS: 1.0000 mg | ORAL_TABLET | Freq: Every day | ORAL | 4 refills | Status: DC

## 2021-07-19 ENCOUNTER — Other Ambulatory Visit: Payer: Self-pay | Admitting: Family Medicine

## 2021-07-19 DIAGNOSIS — G43709 Chronic migraine without aura, not intractable, without status migrainosus: Secondary | ICD-10-CM

## 2021-07-27 ENCOUNTER — Ambulatory Visit (INDEPENDENT_AMBULATORY_CARE_PROVIDER_SITE_OTHER): Payer: 59 | Admitting: Family Medicine

## 2021-07-27 ENCOUNTER — Encounter: Payer: Self-pay | Admitting: Family Medicine

## 2021-07-27 VITALS — BP 134/81 | HR 68 | Temp 98.2°F | Ht 65.0 in | Wt 148.4 lb

## 2021-07-27 DIAGNOSIS — G43709 Chronic migraine without aura, not intractable, without status migrainosus: Secondary | ICD-10-CM

## 2021-07-27 DIAGNOSIS — Z0001 Encounter for general adult medical examination with abnormal findings: Secondary | ICD-10-CM

## 2021-07-27 DIAGNOSIS — S8992XS Unspecified injury of left lower leg, sequela: Secondary | ICD-10-CM

## 2021-07-27 DIAGNOSIS — Z0000 Encounter for general adult medical examination without abnormal findings: Secondary | ICD-10-CM

## 2021-07-27 DIAGNOSIS — R3129 Other microscopic hematuria: Secondary | ICD-10-CM

## 2021-07-27 DIAGNOSIS — E559 Vitamin D deficiency, unspecified: Secondary | ICD-10-CM

## 2021-07-27 DIAGNOSIS — Z13220 Encounter for screening for lipoid disorders: Secondary | ICD-10-CM

## 2021-07-27 DIAGNOSIS — F419 Anxiety disorder, unspecified: Secondary | ICD-10-CM

## 2021-07-27 DIAGNOSIS — C50412 Malignant neoplasm of upper-outer quadrant of left female breast: Secondary | ICD-10-CM | POA: Diagnosis not present

## 2021-07-27 DIAGNOSIS — Z170 Estrogen receptor positive status [ER+]: Secondary | ICD-10-CM

## 2021-07-27 MED ORDER — SUMATRIPTAN SUCCINATE 100 MG PO TABS: ORAL_TABLET | ORAL | 12 refills | Status: DC

## 2021-07-27 MED ORDER — NURTEC 75 MG PO TBDP: ORAL_TABLET | ORAL | 12 refills | Status: DC

## 2021-07-27 MED ORDER — ESCITALOPRAM OXALATE 20 MG PO TABS: 20.0000 mg | ORAL_TABLET | Freq: Every day | ORAL | 3 refills | Status: DC

## 2021-07-27 NOTE — Progress Notes (Signed)
? ?Carolyn Cooper is a 66 y.o. female presents to office today for annual physical exam examination.   ? ?Concerns today include: ?1.  Knee pain ?Patient reports that she injured her left knee about 2 years ago.  She was horse playing with her husband and they fell over and he unfortunately fell along her left medial knee.  She notes that she has limited external rotation of that knee, which was identified by her massage therapist.  She notes that catches but does not necessarily hurt very bad.  Would like to see an orthopedist for further evaluation management.  Previously seen by [facility] ? ?2.  Migraine headaches ?Patient reports that migraine headaches seem to be really well controlled with Nurtec.  She takes this every other day for prevention and then uses Imitrex as needed for breakthrough migraines.  She does tend to use the Imitrex at the earliest onset of a migraine headache as she does not want to become severe so typically using the full 9 tablets/month. ? ?Occupation: retired, Marital status: married, Substance use: none ?Diet: balanced, Exercise: stays active ?Last colonoscopy: UTD ?Last mammogram: UTD ?Last pap smear: UTD ?Refills needed today: all ?Immunizations needed: ?Immunization History  ?Administered Date(s) Administered  ? Influenza Split 01/16/2013  ? Influenza,inj,Quad PF,6+ Mos 01/14/2014, 01/14/2015, 01/05/2016, 01/03/2017, 12/30/2017, 12/28/2018, 12/29/2018, 01/24/2020  ? Influenza-Unspecified 01/13/2021  ? MMR 08/13/2009  ? Moderna Sars-Covid-2 Vaccination 04/06/2019, 05/11/2019  ? Td 08/04/2009  ? Tdap 08/04/2009, 04/10/2019  ? Zoster Recombinat (Shingrix) 12/01/2016, 07/08/2017  ? Zoster, Live 01/29/2015  ? ? ? ?Past Medical History:  ?Diagnosis Date  ? Allergy   ? seasonal  ? Anxiety   ? Barrett's esophagus   ? Breast cancer ([place]) 01/2020  ? left breast ILC  ? Cataract   ? had surgery  ? Depression   ? Family history of breast cancer   ? Family history of kidney cancer   ?  Family history of ovarian cancer   ? Family history of stomach cancer   ? GERD (gastroesophageal reflux disease)   ? IBS (irritable bowel syndrome)   ? Migraines   ? Osteopenia 2019  ? T score -1.5 FRAX 8% / 0.7%  ? ?Social History  ? ?Socioeconomic History  ? Marital status: Married  ?  Spouse name: Not on file  ? Number of children: Not on file  ? Years of education: Not on file  ? Highest education level: Not on file  ?Occupational History  ? Not on file  ?Tobacco Use  ? Smoking status: Never  ? Smokeless tobacco: Never  ?Vaping Use  ? Vaping Use: Never used  ?Substance and Sexual Activity  ? Alcohol use: Not Currently  ?  Alcohol/week: 0.0 standard drinks  ? Drug use: No  ? Sexual activity: Not Currently  ?  Partners: Male  ?  Birth control/protection: Post-menopausal  ?  Comment: 1st intercourse 66 yo-Fewer than 5 partners (husband diagnosed with multiple myeloma)  ?Other Topics Concern  ? Not on file  ?Social History Narrative  ? Not on file  ? ?Social Determinants of Health  ? ?Financial Resource Strain: Not on file  ?Food Insecurity: Not on file  ?Transportation Needs: Not on file  ?Physical Activity: Not on file  ?Stress: Not on file  ?Social Connections: Not on file  ?Intimate Partner Violence: Not on file  ? ?Past Surgical History:  ?Procedure Laterality Date  ? BREAST LUMPECTOMY WITH RADIOACTIVE SEED AND SENTINEL LYMPH NODE BIOPSY Left 02/14/2020  ?  Procedure: LEFT BREAST LUMPECTOMY WITH RADIOACTIVE SEED AND SENTINEL LYMPH NODE MAPPING;  Surgeon: Erroll Luna, MD;  Location: [place];  Service: General;  Laterality: Left;  PECTORAL BLOCK  ? CATARACT EXTRACTION, BILATERAL  2018  ? CERVICAL CONE BIOPSY  1996  ? COLONOSCOPY  07/21/2007  ? CYSTOSCOPY    ? [place]  ? WISDOM TEETH  ? ?Family History  ?Problem Relation Age of Onset  ? Ovarian cancer Maternal Grandmother 60  ? Diverticulitis Mother   ? Heart disease Father   ? Kidney cancer Father 60  ? Stomach cancer Maternal  Grandfather   ?     dx 69s  ? Breast cancer Other   ?     dx >50, maternal great-aunt  ? Breast cancer Other   ?     dx >50, maternal great-aunt  ? Breast cancer Other   ?     dx >50, maternal great-aunt  ? Colon cancer Neg Hx   ? Esophageal cancer Neg Hx   ? Rectal cancer Neg Hx   ? ? ?Current Outpatient Medications:  ?  anastrozole (ARIMIDEX) 1 MG tablet, Take 1 tablet (1 mg total) by mouth daily., Disp: 90 tablet, Rfl: 4 ?  Cholecalciferol (VITAMIN D) 400 UNITS capsule, Take 800 Units by mouth daily., Disp: , Rfl:  ?  escitalopram (LEXAPRO) 20 MG tablet, Take 1 tablet (20 mg total) by mouth daily., Disp: 90 tablet, Rfl: 3 ?  NURTEC 75 MG TBDP, TAKE 1 TABLET BY MOUTH DAILY AS NEEDED (MIGRAINE)., Disp: 16 tablet, Rfl: 0 ?  omeprazole (PRILOSEC) 40 MG capsule, TAKE 1 CAPSULE BY MOUTH EVERY DAY, Disp: 90 capsule, Rfl: 1 ?  SUMAtriptan (IMITREX) 100 MG tablet, May repeat in 2 hours if headache persists or recurs., Disp: 9 tablet, Rfl: 12 ? ?Allergies  ?Allergen Reactions  ? Sulfa Antibiotics Hives  ?  ? ?ROS: ?Review of Systems ?Pertinent items noted in HPI and remainder of comprehensive ROS otherwise negative.   ? ?Physical exam ?BP 134/81   Pulse 68   Temp 98.2 ?F (36.8 ?C)   Ht '5\' 5"'  (1.651 m)   Wt 148 lb 6.4 oz (67.3 kg)   SpO2 98%   BMI 24.70 kg/m?  ?General appearance: alert, cooperative, appears stated age, and no distress ?Head: Normocephalic, without obvious abnormality, atraumatic ?Eyes: negative findings: lids and lashes normal, conjunctivae and sclerae normal, corneas clear, and pupils equal, round, reactive to light and accomodation ?Ears: normal TM's and external ear canals both ears ?Nose: Nares normal. Septum midline. Mucosa normal. No drainage or sinus tenderness. ?Throat: lips, mucosa, and tongue normal; teeth and gums normal ?Neck: no adenopathy, no carotid bruit, supple, symmetrical, trachea midline, and thyroid not enlarged, symmetric, no tenderness/mass/nodules ?Back: symmetric, no  curvature. ROM normal. No CVA tenderness. ?Lungs: clear to auscultation bilaterally ?Heart: regular rate and rhythm, S1, S2 normal, no murmur, click, rub or gallop ?Abdomen: soft, non-tender; bowel sounds normal; no masses,  no organomegaly ?Extremities:  Decreased range of motion both actively and passively and external rotation of the left knee.  Lower extremities otherwise atraumatic.  No edema ?Pulses: 2+ and symmetric ?Skin: Skin color, texture, turgor normal. No rashes or lesions ?Lymph nodes: Cervical, supraclavicular, and axillary nodes normal. ?Neurologic: Grossly normal ?Psych: Mood stable, speech normal ? ? ?  07/27/2021  ?  8:16 AM 03/18/2021  ? 11:06 AM 08/27/2020  ?  3:18 PM  ?Depression screen PHQ 2/9  ?Decreased Interest 0 0 0  ?  Down, Depressed, Hopeless 0 0 0  ?PHQ - 2 Score 0 0 0  ? ? ?  07/27/2021  ?  8:16 AM 03/18/2021  ? 11:06 AM  ?GAD 7 : Generalized Anxiety Score  ?Nervous, Anxious, on Edge 0 0  ?Control/stop worrying 0 0  ?Worry too much - different things 0 0  ?Trouble relaxing 0 0  ?Restless 0 0  ?Easily annoyed or irritable 0 0  ?Afraid - awful might happen 0 0  ?Total GAD 7 Score 0 0  ?Anxiety Difficulty Not difficult at all Not difficult at all  ? ? ?Assessment/ Plan: ?Rainbow Salman here for annual physical exam.  ? ?Annual physical exam ? ?Chronic migraine without aura without status migrainosus, not intractable - Plan: CMP14+EGFR, CBC with Differential, Rimegepant Sulfate (NURTEC) 75 MG TBDP, SUMAtriptan (IMITREX) 100 MG tablet ? ?Microscopic hematuria - Plan: CBC with Differential ? ?Malignant neoplasm of upper-outer quadrant of left breast in female, estrogen receptor positive ([place]) - Plan: CBC with Differential ? ?Anxiety - Plan: TSH, escitalopram (LEXAPRO) 20 MG tablet ? ?Vitamin D deficiency - Plan: CMP14+EGFR, VITAMIN D 25 Hydroxy (Vit-D Deficiency, Fractures) ? ?Screening cholesterol level - Plan: Lipid Panel ? ?Injury of left knee, sequela - Plan: Ambulatory referral to  Orthopedic Surgery ? ?Migraines are chronic and stable and seem to be fairly well controlled with current regimen.  I renewed all of her medications and given her a sample as well today ? ?Check CBC given history of breast

## 2021-07-27 NOTE — Patient Instructions (Signed)
You had labs performed today.  You will be contacted with the results of the labs once they are available, usually in the next 3 business days for routine lab work.  If you have an active my chart account, they will be released to your MyChart.  If you prefer to have these labs released to you via telephone, please let us know. ? ?If you had a pap smear or biopsy performed, expect to be contacted in about 7-10 days. ? ?Preventive Care 65-65 Years Old, Female ?Preventive care refers to lifestyle choices and visits with your health care provider that can promote health and wellness. Preventive care visits are also called wellness exams. ?What can I expect for my preventive care visit? ?Counseling ?Your health care provider may ask you questions about your: ?Medical history, including: ?Past medical problems. ?Family medical history. ?Pregnancy history. ?Current health, including: ?Menstrual cycle. ?Method of birth control. ?Emotional well-being. ?Home life and relationship well-being. ?Sexual activity and sexual health. ?Lifestyle, including: ?Alcohol, nicotine or tobacco, and drug use. ?Access to firearms. ?Diet, exercise, and sleep habits. ?Work and work Statistician. ?Sunscreen use. ?Safety issues such as seatbelt and bike helmet use. ?Physical exam ?Your health care provider will check your: ?Height and weight. These may be used to calculate your BMI (body mass index). BMI is a measurement that tells if you are at a healthy weight. ?Waist circumference. This measures the distance around your waistline. This measurement also tells if you are at a healthy weight and may help predict your risk of certain diseases, such as type 2 diabetes and high blood pressure. ?Heart rate and blood pressure. ?Body temperature. ?Skin for abnormal spots. ?What immunizations do I need? ? ?Vaccines are usually given at various ages, according to a schedule. Your health care provider will recommend vaccines for you based on your age,  medical history, and lifestyle or other factors, such as travel or where you work. ?What tests do I need? ?Screening ?Your health care provider may recommend screening tests for certain conditions. This may include: ?Lipid and cholesterol levels. ?Diabetes screening. This is done by checking your blood sugar (glucose) after you have not eaten for a while (fasting). ?Pelvic exam and Pap test. ?Hepatitis B test. ?Hepatitis C test. ?HIV (human immunodeficiency virus) test. ?STI (sexually transmitted infection) testing, if you are at risk. ?Lung cancer screening. ?Colorectal cancer screening. ?Mammogram. Talk with your health care provider about when you should start having regular mammograms. This may depend on whether you have a family history of breast cancer. ?BRCA-related cancer screening. This may be done if you have a family history of breast, ovarian, tubal, or peritoneal cancers. ?Bone density scan. This is done to screen for osteoporosis. ?Talk with your health care provider about your test results, treatment options, and if necessary, the need for more tests. ?Follow these instructions at home: ?Eating and drinking ? ?Eat a diet that includes fresh fruits and vegetables, whole grains, lean protein, and low-fat dairy products. ?Take vitamin and mineral supplements as recommended by your health care provider. ?Do not drink alcohol if: ?Your health care provider tells you not to drink. ?You are pregnant, may be pregnant, or are planning to become pregnant. ?If you drink alcohol: ?Limit how much you have to 0-1 drink a day. ?Know how much alcohol is in your drink. In the [place] one drink equals one 12 oz bottle of beer (355 mL), one 5 oz glass of wine (148 mL), or one 1? oz glass of hard liquor (  44 mL). ?Lifestyle ?Brush your teeth every morning and night with fluoride toothpaste. Floss one time each day. ?Exercise for at least 30 minutes 5 or more days each week. ?Do not use any products that contain nicotine or  tobacco. These products include cigarettes, chewing tobacco, and vaping devices, such as e-cigarettes. If you need help quitting, ask your health care provider. ?Do not use drugs. ?If you are sexually active, practice safe sex. Use a condom or other form of protection to prevent STIs. ?If you do not wish to become pregnant, use a form of birth control. If you plan to become pregnant, see your health care provider for a prepregnancy visit. ?Take aspirin only as told by your health care provider. Make sure that you understand how much to take and what form to take. Work with your health care provider to find out whether it is safe and beneficial for you to take aspirin daily. ?Find healthy ways to manage stress, such as: ?Meditation, yoga, or listening to music. ?Journaling. ?Talking to a trusted person. ?Spending time with friends and family. ?Minimize exposure to UV radiation to reduce your risk of skin cancer. ?Safety ?Always wear your seat belt while driving or riding in a vehicle. ?Do not drive: ?If you have been drinking alcohol. Do not ride with someone who has been drinking. ?When you are tired or distracted. ?While texting. ?If you have been using any mind-altering substances or drugs. ?Wear a helmet and other protective equipment during sports activities. ?If you have firearms in your house, make sure you follow all gun safety procedures. ?Seek help if you have been physically or sexually abused. ?What's next? ?Visit your health care provider once a year for an annual wellness visit. ?Ask your health care provider how often you should have your eyes and teeth checked. ?Stay up to date on all vaccines. ?This information is not intended to replace advice given to you by your health care provider. Make sure you discuss any questions you have with your health care provider. ?Document Revised: 09/17/2020 Document Reviewed: 09/17/2020 ?Elsevier Patient Education ? [place]. ? ?

## 2021-07-28 ENCOUNTER — Encounter: Payer: Self-pay | Admitting: Family Medicine

## 2021-07-28 LAB — CMP14+EGFR
ALT: 13 IU/L (ref 0–32)
AST: 17 IU/L (ref 0–40)
Albumin/Globulin Ratio: 2 (ref 1.2–2.2)
Albumin: 4.4 g/dL (ref 3.8–4.8)
Alkaline Phosphatase: 113 IU/L (ref 44–121)
BUN/Creatinine Ratio: 12 (ref 12–28)
BUN: 9 mg/dL (ref 8–27)
Bilirubin Total: 0.4 mg/dL (ref 0.0–1.2)
CO2: 26 mmol/L (ref 20–29)
Calcium: 9.8 mg/dL (ref 8.7–10.3)
Chloride: 101 mmol/L (ref 96–106)
Creatinine, Ser: 0.73 mg/dL (ref 0.57–1.00)
Globulin, Total: 2.2 g/dL (ref 1.5–4.5)
Glucose: 90 mg/dL (ref 70–99)
Potassium: 3.8 mmol/L (ref 3.5–5.2)
Sodium: 141 mmol/L (ref 134–144)
Total Protein: 6.6 g/dL (ref 6.0–8.5)
eGFR: 92 mL/min/{1.73_m2} (ref >59)

## 2021-07-28 LAB — TSH: TSH: 2.34 u[IU]/mL (ref 0.450–4.500)

## 2021-07-28 LAB — CBC WITH DIFFERENTIAL/PLATELET
Basophils Absolute: 0 10*3/uL (ref 0.0–0.2)
Basos: 1 %
EOS (ABSOLUTE): 0.1 10*3/uL (ref 0.0–0.4)
Eos: 2 %
Hematocrit: 39.7 % (ref 34.0–46.6)
Hemoglobin: 13.1 g/dL (ref 11.1–15.9)
Immature Grans (Abs): 0 10*3/uL (ref 0.0–0.1)
Immature Granulocytes: 0 %
Lymphocytes Absolute: 1.5 10*3/uL (ref 0.7–3.1)
Lymphs: 24 %
MCH: 32.3 pg (ref 26.6–33.0)
MCHC: 33 g/dL (ref 31.5–35.7)
MCV: 98 fL — ABNORMAL HIGH (ref 79–97)
Monocytes Absolute: 0.5 10*3/uL (ref 0.1–0.9)
Monocytes: 8 %
Neutrophils Absolute: 4 10*3/uL (ref 1.4–7.0)
Neutrophils: 65 %
Platelets: 275 10*3/uL (ref 150–450)
RBC: 4.06 x10E6/uL (ref 3.77–5.28)
RDW: 11.5 % — ABNORMAL LOW (ref 11.7–15.4)
WBC: 6.2 10*3/uL (ref 3.4–10.8)

## 2021-07-28 LAB — LIPID PANEL
Chol/HDL Ratio: 3.1 ratio (ref 0.0–4.4)
Cholesterol, Total: 212 mg/dL — ABNORMAL HIGH (ref 100–199)
HDL: 69 mg/dL (ref >39)
LDL Chol Calc (NIH): 130 mg/dL — ABNORMAL HIGH (ref 0–99)
Triglycerides: 74 mg/dL (ref 0–149)
VLDL Cholesterol Cal: 13 mg/dL (ref 5–40)

## 2021-07-28 LAB — VITAMIN D 25 HYDROXY (VIT D DEFICIENCY, FRACTURES): Vit D, 25-Hydroxy: 54.4 ng/mL (ref 30.0–100.0)

## 2021-07-28 NOTE — Telephone Encounter (Signed)
It's OTC. She can get off [employer]. Not rx. ?

## 2021-08-10 ENCOUNTER — Ambulatory Visit: Payer: Medicare HMO | Admitting: Dermatology

## 2021-08-10 ENCOUNTER — Encounter: Payer: Self-pay | Admitting: Dermatology

## 2021-08-10 ENCOUNTER — Other Ambulatory Visit: Payer: Self-pay | Admitting: Hematology and Oncology

## 2021-08-10 DIAGNOSIS — Z85828 Personal history of other malignant neoplasm of skin: Secondary | ICD-10-CM

## 2021-08-10 DIAGNOSIS — Z853 Personal history of malignant neoplasm of breast: Secondary | ICD-10-CM

## 2021-08-10 DIAGNOSIS — L570 Actinic keratosis: Secondary | ICD-10-CM | POA: Diagnosis not present

## 2021-08-10 DIAGNOSIS — L738 Other specified follicular disorders: Secondary | ICD-10-CM | POA: Diagnosis not present

## 2021-08-10 DIAGNOSIS — Z1283 Encounter for screening for malignant neoplasm of skin: Secondary | ICD-10-CM | POA: Diagnosis not present

## 2021-08-10 DIAGNOSIS — L280 Lichen simplex chronicus: Secondary | ICD-10-CM

## 2021-08-12 DIAGNOSIS — M25562 Pain in left knee: Secondary | ICD-10-CM | POA: Diagnosis not present

## 2021-08-25 ENCOUNTER — Encounter: Payer: Self-pay | Admitting: Dermatology

## 2021-08-25 NOTE — Progress Notes (Signed)
   Follow-Up Visit   Subjective  Carolyn Cooper is a 66 y.o. female who presents for the following: Follow-up (Right lower leg- healing good no concerns).  Check site of skin cancer leg plus several other areas Location:  Duration:  Quality:  Associated Signs/Symptoms: Modifying Factors:  Severity:  Timing: Context:   Objective  Well appearing patient in no apparent distress; mood and affect are within normal limits. No atypical nevi or signs of NMSC noted at the time of the visit. Waist up exam today, many keratoses ton the torso and legs   Mid Forehead Several 1 to 2 mm flesh-colored papules, many with eccentric dell  Right Lower Leg - Anterior No sign residual cancer, modest dyschromia with minimal scar.  Patient is pleased with result.  Head - Anterior (Face) Left upper lip 2 mm lichenified scale    All sun exposed areas plus back examined.   Assessment & Plan    Screening exam for skin cancer  Annual skin examination, encouraged to self examine with spouse twice annually.  Continue ultraviolet protection.  Sebaceous hyperplasia of face Mid Forehead  Told of similar appearance of early BCC so if any lesion grows or bleeds return for biopsy  Personal history of skin cancer Right Lower Leg - Anterior  Check as needed change.  Lichenoid keratosis Head - Anterior (Face)  Defer any treatment today       I, Lavonna Monarch, MD, have reviewed all documentation for this visit.  The documentation on 08/25/21 for the exam, diagnosis, procedures, and orders are all accurate and complete.

## 2021-08-27 ENCOUNTER — Ambulatory Visit
Admission: RE | Admit: 2021-08-27 | Discharge: 2021-08-27 | Disposition: A | Discharge status: Home / Self Care | Payer: Medicare HMO | Source: Ambulatory Visit | Attending: Hematology and Oncology | Admitting: Hematology and Oncology

## 2021-08-27 DIAGNOSIS — R922 Inconclusive mammogram: Secondary | ICD-10-CM | POA: Diagnosis not present

## 2021-08-27 DIAGNOSIS — Z853 Personal history of malignant neoplasm of breast: Secondary | ICD-10-CM | POA: Diagnosis not present

## 2021-08-27 HISTORY — DX: Personal history of irradiation: Z92.3

## 2021-09-02 ENCOUNTER — Encounter: Payer: Self-pay | Admitting: Family Medicine

## 2021-09-17 ENCOUNTER — Encounter: Payer: Self-pay | Admitting: Family Medicine

## 2021-10-01 ENCOUNTER — Ambulatory Visit: Payer: 59 | Admitting: Hematology and Oncology

## 2021-10-01 ENCOUNTER — Other Ambulatory Visit: Payer: 59

## 2021-10-04 ENCOUNTER — Encounter: Payer: Self-pay | Admitting: Family Medicine

## 2021-10-05 ENCOUNTER — Inpatient Hospital Stay: Payer: Medicare HMO | Attending: Hematology and Oncology

## 2021-10-05 ENCOUNTER — Inpatient Hospital Stay (HOSPITAL_BASED_OUTPATIENT_CLINIC_OR_DEPARTMENT_OTHER): Payer: Medicare HMO | Admitting: Hematology and Oncology

## 2021-10-05 ENCOUNTER — Other Ambulatory Visit: Payer: Self-pay

## 2021-10-05 ENCOUNTER — Encounter: Payer: Self-pay | Admitting: Hematology and Oncology

## 2021-10-05 ENCOUNTER — Other Ambulatory Visit: Payer: Self-pay | Admitting: *Deleted

## 2021-10-05 VITALS — BP 129/76 | HR 66 | Temp 97.2°F | Resp 18 | Ht 65.0 in | Wt 149.3 lb

## 2021-10-05 DIAGNOSIS — Z79811 Long term (current) use of aromatase inhibitors: Secondary | ICD-10-CM | POA: Diagnosis not present

## 2021-10-05 DIAGNOSIS — Z170 Estrogen receptor positive status [ER+]: Secondary | ICD-10-CM | POA: Diagnosis not present

## 2021-10-05 DIAGNOSIS — C50412 Malignant neoplasm of upper-outer quadrant of left female breast: Secondary | ICD-10-CM | POA: Diagnosis present

## 2021-10-05 DIAGNOSIS — M8580 Other specified disorders of bone density and structure, unspecified site: Secondary | ICD-10-CM | POA: Diagnosis not present

## 2021-10-05 LAB — CMP (CANCER CENTER ONLY)
ALT: 14 U/L (ref 0–44)
AST: 18 U/L (ref 15–41)
Albumin: 4.4 g/dL (ref 3.5–5.0)
Alkaline Phosphatase: 101 U/L (ref 38–126)
Anion gap: 5 (ref 5–15)
BUN: 11 mg/dL (ref 8–23)
CO2: 31 mmol/L (ref 22–32)
Calcium: 9.8 mg/dL (ref 8.9–10.3)
Chloride: 104 mmol/L (ref 98–111)
Creatinine: 0.79 mg/dL (ref 0.44–1.00)
GFR, Estimated: >60 mL/min (ref >60)
Glucose, Bld: 96 mg/dL (ref 70–99)
Potassium: 4.1 mmol/L (ref 3.5–5.1)
Sodium: 140 mmol/L (ref 135–145)
Total Bilirubin: 0.6 mg/dL (ref 0.3–1.2)
Total Protein: 7.1 g/dL (ref 6.5–8.1)

## 2021-10-05 LAB — CBC WITH DIFFERENTIAL (CANCER CENTER ONLY)
Abs Immature Granulocytes: 0.01 10*3/uL (ref 0.00–0.07)
Basophils Absolute: 0 10*3/uL (ref 0.0–0.1)
Basophils Relative: 1 %
Eosinophils Absolute: 0.1 10*3/uL (ref 0.0–0.5)
Eosinophils Relative: 2 %
HCT: 37.8 % (ref 36.0–46.0)
Hemoglobin: 13.1 g/dL (ref 12.0–15.0)
Immature Granulocytes: 0 %
Lymphocytes Relative: 22 %
Lymphs Abs: 1.2 10*3/uL (ref 0.7–4.0)
MCH: 33.6 pg (ref 26.0–34.0)
MCHC: 34.7 g/dL (ref 30.0–36.0)
MCV: 96.9 fL (ref 80.0–100.0)
Monocytes Absolute: 0.4 10*3/uL (ref 0.1–1.0)
Monocytes Relative: 8 %
Neutro Abs: 3.6 10*3/uL (ref 1.7–7.7)
Neutrophils Relative %: 67 %
Platelet Count: 228 10*3/uL (ref 150–400)
RBC: 3.9 MIL/uL (ref 3.87–5.11)
RDW: 12 % (ref 11.5–15.5)
WBC Count: 5.3 10*3/uL (ref 4.0–10.5)
nRBC: 0 % (ref 0.0–0.2)

## 2021-10-05 NOTE — Progress Notes (Signed)
[place]  [phone] [phone]) [phone]     ID: Tasheba Henson DOB: Jul 15, 1955  MR#: [id number]  HQI#:[id number]  Patient Care Team: Janora Norlander, DO as PCP - General (Family Medicine) Mauro Kaufmann, RN as Oncology Nurse Navigator Rockwell Germany, RN as Oncology Nurse Navigator Magrinat, Virgie Dad, MD (Inactive) as Consulting Physician (Oncology) Kyung Rudd, MD as Consulting Physician (Radiation Oncology) Erroll Luna, MD as Consulting Physician (General Surgery) Tamela Gammon, NP as Nurse Practitioner (Gynecology) Mauri Pole, MD as Consulting Physician (Gastroenterology) Lavonna Monarch, MD as Consulting Physician (Dermatology) Benay Pike, MD OTHER MD:   CHIEF COMPLAINT: Invasive lobular breast cancer  CURRENT TREATMENT: anastrozole   INTERVAL HISTORY: Carolyn Cooper returns today for follow up of her invasive lobular breast cancer.  She is tolerating anastrozole very well for adjuvant antiestrogen therapy.  No adverse effects reported. Since her last visit, she underwent bone density screening on 07/07/2020 showing a T-score of -1.4, which is considered osteopenic.  She is taking calcium/vitamin D supplementation and tries to walk as best as she can. Last mammogram in May 2023 without any evidence of malignancy.  Rest of the pertinent 10 point ROS reviewed and negative   COVID 19 VACCINATION STATUS: Status post 2 doses, booster pending as of January 2022   HISTORY OF CURRENT ILLNESS: From the original intake note:  "Carolyn Cooper" had routine screening mammography on 01/02/2020 showing a possible abnormality in the left breast. She underwent left diagnostic mammography with tomography and left breast ultrasonography at [facility] on 01/15/2020 showing: breast density category C; suspicious 3 mm mass in left breast at 2:30; no left lymphadenopathy.  Accordingly on 01/28/2020 she proceeded to biopsy of the left breast area  in question. The pathology from this procedure ([id number]) showed: invasive and in situ mammary carcinoma, e-cadherin negative, grade 2. Prognostic indicators significant for: estrogen receptor, >95% positive with strong staining intensity and progesterone receptor, 20% positive with moderate staining intensity. Proliferation marker Ki67 at 10%. HER2 equivocal by immunohistochemistry (2+), but negative by fluorescent in situ hybridization with a signals ratio 1.52 and number per cell 3.5.  The patient's subsequent history is as detailed below.   PAST MEDICAL HISTORY: Past Medical History:  Diagnosis Date   Allergy    seasonal   Anxiety    Barrett's esophagus    Breast cancer ([place]) 01/2020   left breast ILC   Cataract    had surgery   Depression    Family history of breast cancer    Family history of kidney cancer    Family history of ovarian cancer    Family history of stomach cancer    GERD (gastroesophageal reflux disease)    IBS (irritable bowel syndrome)    Migraines    Osteopenia 2019   T score -1.5 FRAX 8% / 0.7%   Personal history of radiation therapy     PAST SURGICAL HISTORY: Past Surgical History:  Procedure Laterality Date   BREAST LUMPECTOMY     BREAST LUMPECTOMY WITH RADIOACTIVE SEED AND SENTINEL LYMPH NODE BIOPSY Left 02/14/2020   Procedure: LEFT BREAST LUMPECTOMY WITH RADIOACTIVE SEED AND SENTINEL LYMPH NODE MAPPING;  Surgeon: Erroll Luna, MD;  Location: [facility];  Service: General;  Laterality: Left;  PECTORAL BLOCK   CATARACT EXTRACTION, BILATERAL  2018   [place]   COLONOSCOPY  07/21/2007   CYSTOSCOPY     MOUTH SURGERY  1978   WISDOM TEETH  FAMILY HISTORY: Family History  Problem Relation Age of Onset   Ovarian cancer Maternal Grandmother 63   Diverticulitis Mother    Heart disease Father    Kidney cancer Father 81   Stomach cancer Maternal Grandfather        dx 10s   Breast cancer Other        dx >50,  maternal great-aunt   Breast cancer Other        dx >50, maternal great-aunt   Breast cancer Other        dx >50, maternal great-aunt   Colon cancer Neg Hx    Esophageal cancer Neg Hx    Rectal cancer Neg Hx    Her parents are living as of 02/2020-- her father at age 62 and her mother at age 77. Carolyn Cooper has one sister (and no brothers). She notes her mother had previously undergone genetic testing for BRCA 1 and 2, and she was negative. She reports ovarian cancer in her maternal grandmother at age 65, stomach cancer in his 65's, and kidney cancer in her dad at age 21.   GYNECOLOGIC HISTORY:  No LMP recorded. Patient is postmenopausal. Menarche: 66 years old Age at first live birth: 66 years old [place] P 4 LMP 2015? Contraceptive: used "pretty much from 4 on" with no issues HRT used briefly, stopped "about 5 years ago"  Hysterectomy? no BSO? 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 Husband Remo Lipps works for [employer]. Daughter Denton Meek, age 64, is a Cabin crew here in [place]. Son Delrae Rend, age 28, is unemployed. Daughter Markus Jarvis, age 53, is a homemaker here in [place]. Son Berdine Dance, age 72, works in Chartered loss adjuster in [place]. Carolyn Cooper has 4 grandchildren. She attends a [employer].    ADVANCED DIRECTIVES: In the absence of any documentation to the contrary, the patient's spouse is their HCPOA.    HEALTH MAINTENANCE: Social History   Tobacco Use   Smoking status: Never   Smokeless tobacco: Never  Vaping Use   Vaping Use: Never used  Substance Use Topics   Alcohol use: Not Currently    Alcohol/week: 0.0 standard drinks of alcohol   Drug use: No     Colonoscopy: 10/2017 (Dr. Silverio Decamp), repeat 2024  PAP: 01/2018, negative  Bone density: 05/2017, -1.5   Allergies  Allergen Reactions   Sulfa Antibiotics Hives    Current Outpatient  Medications  Medication Sig Dispense Refill   anastrozole (ARIMIDEX) 1 MG tablet Take 1 tablet (1 mg total) by mouth daily. 90 tablet 4   Cholecalciferol (VITAMIN D) 400 UNITS capsule Take 800 Units by mouth daily.     escitalopram (LEXAPRO) 20 MG tablet Take 1 tablet (20 mg total) by mouth daily. 90 tablet 3   omeprazole (PRILOSEC) 40 MG capsule TAKE 1 CAPSULE BY MOUTH EVERY DAY 90 capsule 1   Rimegepant Sulfate (NURTEC) 75 MG TBDP Take 1 tablet every other day for migraine prevention 16 tablet 12   SUMAtriptan (IMITREX) 100 MG tablet May repeat in 2 hours if headache persists or recurs. 9 tablet 12   No current facility-administered medications for this visit.    OBJECTIVE: White woman in no acute distress  Vitals:   10/05/21 0930  BP: 129/76  Pulse: 66  Resp: 18  Temp: (!) 97.2 F (36.2 C)  SpO2: 100%      Body mass index is 24.84 kg/m.  Wt Readings from Last 3 Encounters:  10/05/21 149 lb 4.8 oz (67.7 kg)  07/27/21 148 lb 6.4 oz (67.3 kg)  05/05/21 145 lb (65.8 kg)      ECOG FS:1 - Symptomatic but completely ambulatory  Physical Exam Constitutional:      Appearance: Normal appearance.  Cardiovascular:     Rate and Rhythm: Normal rate and regular rhythm.     Pulses: Normal pulses.     Heart sounds: Normal heart sounds.  Pulmonary:     Effort: Pulmonary effort is normal.     Breath sounds: Normal breath sounds.  Chest:     Comments: Bilateral breasts status post inspection and palpation.  No palpable masses or regional adenopathy Abdominal:     General: Abdomen is flat. Bowel sounds are normal.     Palpations: Abdomen is soft.  Musculoskeletal:     Cervical back: Normal range of motion and neck supple. No rigidity.  Lymphadenopathy:     Cervical: No cervical adenopathy.  Skin:    General: Skin is warm and dry.  Neurological:     General: No focal deficit present.     Mental Status: She is alert.     LAB RESULTS:  CMP     Component Value Date/Time    NA 141 07/27/2021 0841   K 3.8 07/27/2021 0841   CL 101 07/27/2021 0841   CO2 26 07/27/2021 0841   GLUCOSE 90 07/27/2021 0841   GLUCOSE 95 09/02/2020 0958   BUN 9 07/27/2021 0841   CREATININE 0.73 07/27/2021 0841   CREATININE 0.88 02/06/2020 0834   CREATININE 0.79 10/11/2012 1527   CALCIUM 9.8 07/27/2021 0841   PROT 6.6 07/27/2021 0841   ALBUMIN 4.4 07/27/2021 0841   AST 17 07/27/2021 0841   AST 18 02/06/2020 0834   ALT 13 07/27/2021 0841   ALT 21 02/06/2020 0834   ALKPHOS 113 07/27/2021 0841   BILITOT 0.4 07/27/2021 0841   BILITOT 0.5 02/06/2020 0834   GFRNONAA >60 09/02/2020 0958   GFRNONAA >60 02/06/2020 0834   GFRAA >60 07/09/2019 0308    No results found for: "TOTALPROTELP", "ALBUMINELP", "A1GS", "A2GS", "BETS", "BETA2SER", "GAMS", "MSPIKE", "SPEI"  Lab Results  Component Value Date   WBC 5.3 10/05/2021   NEUTROABS 3.6 10/05/2021   HGB 13.1 10/05/2021   HCT 37.8 10/05/2021   MCV 96.9 10/05/2021   PLT 228 10/05/2021    No results found for: "LABCA2"  No components found for: "MEQAST419"  No results for input(s): "INR" in the last 168 hours.  No results found for: "LABCA2"  No results found for: "QQI297"  No results found for: "CAN125"  No results found for: "CAN153"  No results found for: "CA2729"  No components found for: "HGQUANT"  No results found for: "CEA1", "CEA" / No results found for: "CEA1", "CEA"   No results found for: "AFPTUMOR"  No results found for: "CHROMOGRNA"  No results found for: "KPAFRELGTCHN", "LAMBDASER", "KAPLAMBRATIO" (kappa/lambda light chains)  No results found for: "HGBA", "HGBA2QUANT", "HGBFQUANT", "HGBSQUAN" (Hemoglobinopathy evaluation)   No results found for: "LDH"  No results found for: "IRON", "TIBC", "IRONPCTSAT" (Iron and TIBC)  No results found for: "FERRITIN"  Urinalysis    Component Value Date/Time   COLORURINE YELLOW 12/19/2015 1217   APPEARANCEUR Clear 10/18/2016 0859   LABSPEC 1.001 12/19/2015  1217   PHURINE 6.0 12/19/2015 1217   GLUCOSEU Negative 10/18/2016 [place] 12/19/2015 1217   BILIRUBINUR Negative 10/18/2016 [place] 12/19/2015 1217  PROTEINUR Negative 10/18/2016 0859   PROTEINUR NEGATIVE 12/19/2015 1217   UROBILINOGEN 0.2 10/12/2013 1545   NITRITE Negative 10/18/2016 0859   NITRITE NEGATIVE 12/19/2015 1217   LEUKOCYTESUR 1+ (A) 10/18/2016 0859    STUDIES: No results found.  ELIGIBLE FOR AVAILABLE RESEARCH PROTOCOL: AET  ASSESSMENT: 66 y.o. [place] woman status post left breast upper outer quadrant biopsy 01/28/2020 for a clinical T1a/b N0, stage IA invasive lobular carcinoma, E-cadherin negative, estrogen and progesterone receptor positive, HER-2 not amplified, with an MIB-1 of 10%.  (1) status post left lumpectomy and sentinel lymph node sampling 02/14/2020 for a pT1c pN0, stage IA invasive lobular carcinoma, grade 2, with negative margins  (a) 2 left axillary lymph nodes were removed  (2) Oncotype score of 16 predicts a risk of recurrence outside the breast in the next 9 years of 4% if the patient's only systemic therapy is antiestrogens for 5 years.  It also predicts no benefit from chemotherapy  (3) adjuvant radiation: Radiation Treatment Dates: 04/08/2020 through 05/05/2020 Site Technique Total Dose (Gy) Dose per Fx (Gy) Completed Fx Beam Energies  Breast, Left: Breast_Lt 3D 42.56/42.56 2.66 16/16 6X, 10X  Breast, Left: Breast_Lt_Bst 3D 8/8 2 4/4 6X, 10X   (4) started anastrozole 05/19/2020  (a) bone density 07/07/2020 shows a T score of -1.4 (minimal osteopenia) mid June 2023, labs same day   PLAN:  She is tolerating anastrozole well, denies any complaints. No concerns for recurrence on exam findings. Last mammogram unremarkable. Next mammogram due 08/2022 Repeat bone density in April 2024.  Total time spent : 30 minutes.  *Total Encounter Time as defined by the Centers for Medicare and Medicaid Services includes, in  addition to the face-to-face time of a patient visit (documented in the note above) non-face-to-face time: obtaining and reviewing outside history, ordering and reviewing medications, tests or procedures, care coordination (communications with other health care professionals or caregivers) and documentation in the medical record.

## 2021-10-24 ENCOUNTER — Encounter: Payer: Self-pay | Admitting: Family Medicine

## 2021-10-27 ENCOUNTER — Telehealth: Payer: Self-pay

## 2021-10-27 NOTE — Telephone Encounter (Signed)
Carolyn Cooper [place]: [id number] help? Call us at [phone] Outcome Additional Information Required The patient currently has access to the requested medication and a Prior Authorization is not needed for the patient/medication. Drug Nurtec '75MG'$  dispersible tablets Form [employer] Electronic [place] Form [phone] NCPDP)

## 2021-11-02 ENCOUNTER — Encounter: Payer: Self-pay | Admitting: Family Medicine

## 2021-11-27 ENCOUNTER — Encounter: Payer: Self-pay | Admitting: Family Medicine

## 2021-12-19 ENCOUNTER — Other Ambulatory Visit: Payer: Self-pay | Admitting: Gastroenterology

## 2021-12-30 ENCOUNTER — Ambulatory Visit: Payer: Medicare HMO | Admitting: Physician Assistant

## 2021-12-30 ENCOUNTER — Encounter: Payer: Self-pay | Admitting: Physician Assistant

## 2021-12-30 VITALS — BP 106/70 | HR 76 | Ht 65.0 in | Wt 156.1 lb

## 2021-12-30 DIAGNOSIS — R110 Nausea: Secondary | ICD-10-CM | POA: Diagnosis not present

## 2021-12-30 DIAGNOSIS — K580 Irritable bowel syndrome with diarrhea: Secondary | ICD-10-CM

## 2021-12-30 DIAGNOSIS — Z8719 Personal history of other diseases of the digestive system: Secondary | ICD-10-CM

## 2021-12-30 MED ORDER — FAMOTIDINE 40 MG PO TABS: 40.0000 mg | ORAL_TABLET | Freq: Every day | ORAL | 3 refills | Status: DC

## 2021-12-30 MED ORDER — OMEPRAZOLE 40 MG PO CPDR: 40.0000 mg | DELAYED_RELEASE_CAPSULE | Freq: Every day | ORAL | 11 refills | Status: DC

## 2021-12-30 MED ORDER — HYOSCYAMINE SULFATE SL 0.125 MG SL SUBL
SUBLINGUAL_TABLET | SUBLINGUAL | 2 refills | Status: DC
Start: 2021-12-30 — End: 2022-01-19

## 2021-12-30 NOTE — Progress Notes (Signed)
Chief Complaint: Follow-up IBS  HPI:    Carolyn Cooper is a 65 year old female with a past medical history as listed below including IBS and anxiety as well as depression, known to Dr. Silverio Decamp, who presents to clinic today for follow-up of her IBS.    10/24/2017 colonoscopy with decreased finger tone internal hemorrhoids that prolapse with straining, one 8 mm polyp in the ascending colon, diverticulosis in the sigmoid and descending colon internal and external hemorrhoids.  Pathology showed sessile serrated polyp and repeat recommended in 5 years.  Patient given IBgard 1 tablet 3 times a day.    05/07/2019 office visit with Dr. Silverio Decamp.  At that time discussed intermittent episodes of irritable bowel predominant diarrhea.  Also discussed GERD.  She was placed on Omeprazole 40 mg daily and scheduled for EGD for further evaluation of dysphagia.  Also discussed limited intake of caffeine drinks and soda and given Levsin sublingual daily as needed for abdominal cramping and IBS.    06/27/2019 EGD for dysphagia and reflux with no endoscopic abnormality.  Biopsies with goblet cell metaplasia in the esophagus.  she was diagnosed with Barrett's.  Repeat recommended in 3 years.    Today, the patient tells me that she still has flares of her IBS with diarrhea.  Describes a lot of lower abdominal cramping that results in diarrhea maybe 3-4 times a day, this can occur for a couple of weeks at a time sometimes.  Most recently she had an episode last month due to a lot of stress because her father died and her son is an alcoholic.  Tells me that over the past couple of days though things seem back to normal and she has had 2 normal bowel movements.  She has tried Levsin as needed in these times but tells me that it really does not work quick enough because by the time she is cramping she will already be having a bowel movement.    Most worrying to her today is that every 3 days or so she wakes up and feels like liquid/a  "bubble" is coming up in her throat and she will often vomit bile which is a burning taste.  Does tell me she has been on a fasting diet over the past few months so she really does not eat anything after 8 PM until noon the next day.  She does drink a cup of black coffee.  Currently, she is taking her Omeprazole 20 mg in the morning.  She has never missed a dose.  Tells me that she has never tried Zofran because she is not really "nauseous" it is more of a "bubble".  Also tells me that this makes her very sleepy.    Denies fever, chills, weight loss, blood in her stool or symptoms that awaken her from sleep.  Past Medical History:  Diagnosis Date   Allergy    seasonal   Anxiety    Barrett's esophagus    Breast cancer ([place]) 01/2020   left breast ILC   Cataract    had surgery   Depression    Family history of breast cancer    Family history of kidney cancer    Family history of ovarian cancer    Family history of stomach cancer    GERD (gastroesophageal reflux disease)    IBS (irritable bowel syndrome)    Migraines    Osteopenia 2019   T score -1.5 FRAX 8% / 0.7%   Personal history of radiation therapy  Past Surgical History:  Procedure Laterality Date   BREAST LUMPECTOMY     BREAST LUMPECTOMY WITH RADIOACTIVE SEED AND SENTINEL LYMPH NODE BIOPSY Left 02/14/2020   Procedure: LEFT BREAST LUMPECTOMY WITH RADIOACTIVE SEED AND SENTINEL LYMPH NODE MAPPING;  Surgeon: Erroll Luna, MD;  Location: [facility];  Service: General;  Laterality: Left;  PECTORAL BLOCK   CATARACT EXTRACTION, BILATERAL  2018   CERVICAL [place]E BIOPSY  1996   COLONOSCOPY  07/21/2007   CYSTOSCOPY     MOUTH SURGERY  1978   WISDOM TEETH    Current Outpatient Medications  Medication Sig Dispense Refill   anastrozole (ARIMIDEX) 1 MG tablet Take 1 tablet (1 mg total) by mouth daily. 90 tablet 4   Cholecalciferol (VITAMIN D) 400 UNITS capsule Take 800 Units by mouth daily.     escitalopram (LEXAPRO)  20 MG tablet Take 1 tablet (20 mg total) by mouth daily. 90 tablet 3   omeprazole (PRILOSEC) 40 MG capsule TAKE 1 CAPSULE BY MOUTH EVERY DAY 90 capsule 1   Rimegepant Sulfate (NURTEC) 75 MG TBDP Take 1 tablet every other day for migraine prevention 16 tablet 12   SUMAtriptan (IMITREX) 100 MG tablet May repeat in 2 hours if headache persists or recurs. 9 tablet 12   No current facility-administered medications for this visit.    Allergies as of 12/30/2021 - Review Complete 10/05/2021  Allergen Reaction Noted   Sulfa antibiotics Hives 09/29/2010    Family History  Problem Relation Age of Onset   Ovarian cancer Maternal Grandmother 68   Diverticulitis Mother    Heart disease Father    Kidney cancer Father 60   Stomach cancer Maternal Grandfather        dx 38s   Breast cancer Other        dx >50, maternal great-aunt   Breast cancer Other        dx >50, maternal great-aunt   Breast cancer Other        dx >50, maternal great-aunt   Colon cancer Neg Hx    Esophageal cancer Neg Hx    Rectal cancer Neg Hx     Social History   Socioeconomic History   Marital status: Married    Spouse name: Not on file   Number of children: Not on file   Years of education: Not on file   Highest education level: Not on file  Occupational History   Not on file  Tobacco Use   Smoking status: Never   Smokeless tobacco: Never  Vaping Use   Vaping Use: Never used  Substance and Sexual Activity   Alcohol use: Not Currently    Alcohol/week: 0.0 standard drinks of alcohol   Drug use: No   Sexual activity: Not Currently    Partners: Male    Birth control/protection: Post-menopausal    Comment: 1st intercourse 66 yo-Fewer than 5 partners (husband diagnosed with multiple myeloma)  Other Topics Concern   Not on file  Social History Narrative   Not on file   Social Determinants of Health   Financial Resource Strain: Low Risk  (02/06/2020)   Overall Financial Resource Strain (CARDIA)     Difficulty of Paying Living Expenses: Not hard at all  Food Insecurity: No Food Insecurity (02/06/2020)   Hunger Vital Sign    Worried About Running Out of Food in the Last Year: Never true    Ran Out of Food in the Last Year: Never true  Transportation Needs: No Transportation Needs (  02/06/2020)   PRAPARE - Hydrologist (Medical): No    Lack of Transportation (Non-Medical): No  Physical Activity: Not on file  Stress: Not on file  Social Connections: Not on file  Intimate Partner Violence: Not on file    Review of Systems:    Constitutional: No weight loss, fever or chills Cardiovascular: No chest pain Respiratory: No SOB  Gastrointestinal: See HPI and otherwise negative   Physical Exam:  Vital signs: BP 106/70   Pulse 76   Ht '5\' 5"'  (1.651 m)   Wt 156 lb 1 oz (70.8 kg)   BMI 25.97 kg/m    Constitutional:   Pleasant Caucasian female appears to be in NAD, Well developed, Well nourished, alert and cooperative Respiratory: Respirations even and unlabored. Lungs clear to auscultation bilaterally.   No wheezes, crackles, or rhonchi.  Cardiovascular: Normal S1, S2. No MRG. Regular rate and rhythm. No peripheral edema, cyanosis or pallor.  Gastrointestinal:  Soft, nondistended, nontender. No rebound or guarding. Normal bowel sounds. No appreciable masses or hepatomegaly. Rectal:  Not performed.  Psychiatric: Demonstrates good judgement and reason without abnormal affect or behaviors.  RELEVANT LABS AND IMAGING: CBC    Component Value Date/Time   WBC 5.3 10/05/2021 0918   WBC 5.7 09/02/2020 0958   RBC 3.90 10/05/2021 0918   HGB 13.1 10/05/2021 0918   HGB 13.1 07/27/2021 0841   HCT 37.8 10/05/2021 0918   HCT 39.7 07/27/2021 0841   PLT 228 10/05/2021 0918   PLT 275 07/27/2021 0841   MCV 96.9 10/05/2021 0918   MCV 98 (H) 07/27/2021 0841   MCH 33.6 10/05/2021 0918   MCHC 34.7 10/05/2021 0918   RDW 12.0 10/05/2021 0918   RDW 11.5 (L) 07/27/2021 0841    LYMPHSABS 1.2 10/05/2021 0918   LYMPHSABS 1.5 07/27/2021 0841   MONOABS 0.4 10/05/2021 0918   EOSABS 0.1 10/05/2021 0918   EOSABS 0.1 07/27/2021 0841   BASOSABS 0.0 10/05/2021 0918   BASOSABS 0.0 07/27/2021 0841    CMP     Component Value Date/Time   NA 140 10/05/2021 0918   NA 141 07/27/2021 0841   K 4.1 10/05/2021 0918   CL 104 10/05/2021 0918   CO2 31 10/05/2021 0918   GLUCOSE 96 10/05/2021 0918   BUN 11 10/05/2021 0918   BUN 9 07/27/2021 0841   CREATININE 0.79 10/05/2021 0918   CREATININE 0.79 10/11/2012 1527   CALCIUM 9.8 10/05/2021 0918   PROT 7.1 10/05/2021 0918   PROT 6.6 07/27/2021 0841   ALBUMIN 4.4 10/05/2021 0918   ALBUMIN 4.4 07/27/2021 0841   AST 18 10/05/2021 0918   ALT 14 10/05/2021 0918   ALKPHOS 101 10/05/2021 0918   BILITOT 0.6 10/05/2021 0918   GFRNONAA >60 10/05/2021 0918   GFRAA >60 07/09/2019 0308    Assessment: 1.  IBS-D: Episodes resulting in lower abdominal cramping and loose stools which can last for a couple of weeks at a time, currently doing better after her latest flare 2.  Nausea: In the morning, spits up "bile/acid", consider relation to gastritis/GERD most likely 3.  Barrett's esophagus: Repeat EGD due in April of next year 4.  History of adenomatous polyps: Repeat colonoscopy due in July of next year  Plan: 1.  Discussed with patient that if she has another flare of IBS-do recommend that she schedule out her Hyoscyamine 0.125 mg sublingual tabs every 6 hours.  She can then back off when she is feeling better.  Prescribed #  120 with 2 refills 2.  Continue Omeprazole 40 mg daily, #30 with 11 refills sent in today. 3.  Start Pepcid 40 mg nightly #30 with 3 refills to see if this helps with morning nausea 4.  Patient to follow in clinic with me in 6 weeks or sooner if necessary.  She will call before then let me know how she is doing.  Ellouise Newer, PA-C [facility] 12/30/2021, 10:55 AM  Cc: Janora Norlander, DO

## 2021-12-30 NOTE — Patient Instructions (Addendum)
_______________________________________________________  If you are age 65 or older, your body mass index should be between 23-30. Your Body mass index is 25.97 kg/m. If this is out of the aforementioned range listed, please consider follow up with your Primary Care Provider.  If you are age 81 or younger, your body mass index should be between 19-25. Your Body mass index is 25.97 kg/m. If this is out of the aformentioned range listed, please consider follow up with your Primary Care Provider.   ________________________________________________________  The [facility] providers would like to encourage you to use [facility] to communicate with providers for non-urgent requests or questions.  Due to long hold times on the telephone, sending your provider a message by [facility] may be a faster and more efficient way to get a response.  Please allow 48 business hours for a response.  Please remember that this is for non-urgent requests.  _______________________________________________________  Follow up in 6 weeks.  Nov. 1st 2023  It was a pleasure to see you today!  Thank you for trusting me with your gastrointestinal care!

## 2022-01-04 ENCOUNTER — Ambulatory Visit (INDEPENDENT_AMBULATORY_CARE_PROVIDER_SITE_OTHER): Payer: Medicare HMO

## 2022-01-04 DIAGNOSIS — Z23 Encounter for immunization: Secondary | ICD-10-CM

## 2022-01-05 DIAGNOSIS — M25562 Pain in left knee: Secondary | ICD-10-CM | POA: Diagnosis not present

## 2022-01-18 ENCOUNTER — Other Ambulatory Visit: Payer: Self-pay | Admitting: Family Medicine

## 2022-01-19 ENCOUNTER — Encounter: Payer: Self-pay | Admitting: Family Medicine

## 2022-01-19 ENCOUNTER — Ambulatory Visit (INDEPENDENT_AMBULATORY_CARE_PROVIDER_SITE_OTHER): Payer: Medicare HMO | Admitting: Family Medicine

## 2022-01-19 VITALS — BP 131/83 | HR 72 | Temp 98.0°F | Ht 65.0 in | Wt 151.2 lb

## 2022-01-19 DIAGNOSIS — G43809 Other migraine, not intractable, without status migrainosus: Secondary | ICD-10-CM | POA: Diagnosis not present

## 2022-01-19 DIAGNOSIS — Z23 Encounter for immunization: Secondary | ICD-10-CM | POA: Diagnosis not present

## 2022-01-19 MED ORDER — EMGALITY 120 MG/ML ~~LOC~~ SOAJ: SUBCUTANEOUS | 99 refills | Status: DC

## 2022-01-19 NOTE — Progress Notes (Signed)
Subjective: ON:GEXBMWUX PCP: Janora Norlander, DO LKG:MWNUU Carolyn Cooper is a 66 y.o. female presenting to clinic today for:  1.  Migraine headaches Patient reports that Nurtec was working well for her migraine headaches but it has landed her in the donut hole and the cost is well over $400.  She had to discontinue this and over the last 2 weeks she has had migraine headaches almost on a nightly basis.  She has Imitrex on hand but tries to use this only if needed.  Has previously failed Ajovy, Aimovig, TCA and various triptans.   ROS: Per HPI  Allergies  Allergen Reactions   Sulfa Antibiotics Hives   Past Medical History:  Diagnosis Date   Allergy    seasonal   Anxiety    Barrett's esophagus    Breast cancer ([place]) 01/2020   left breast ILC   Cataract    had surgery   Depression    Family history of breast cancer    Family history of kidney cancer    Family history of ovarian cancer    Family history of stomach cancer    GERD (gastroesophageal reflux disease)    IBS (irritable bowel syndrome)    Migraines    Osteopenia 2019   T score -1.5 FRAX 8% / 0.7%   Personal history of radiation therapy     Current Outpatient Medications:    anastrozole (ARIMIDEX) 1 MG tablet, Take 1 tablet (1 mg total) by mouth daily., Disp: 90 tablet, Rfl: 4   Cholecalciferol (VITAMIN D) 400 UNITS capsule, Take 800 Units by mouth daily., Disp: , Rfl:    escitalopram (LEXAPRO) 20 MG tablet, Take 1 tablet (20 mg total) by mouth daily., Disp: 90 tablet, Rfl: 3   famotidine (PEPCID) 40 MG tablet, Take 1 tablet (40 mg total) by mouth at bedtime., Disp: 30 tablet, Rfl: 3   omeprazole (PRILOSEC) 40 MG capsule, Take 1 capsule (40 mg total) by mouth daily., Disp: 30 capsule, Rfl: 11   SUMAtriptan (IMITREX) 100 MG tablet, May repeat in 2 hours if headache persists or recurs., Disp: 9 tablet, Rfl: 12 Social History   Socioeconomic History   Marital status: Married    Spouse name: Not on file    Number of children: Not on file   Years of education: Not on file   Highest education level: Not on file  Occupational History   Not on file  Tobacco Use   Smoking status: Never   Smokeless tobacco: Never  Vaping Use   Vaping Use: Never used  Substance and Sexual Activity   Alcohol use: Not Currently    Alcohol/week: 0.0 standard drinks of alcohol   Drug use: No   Sexual activity: Not Currently    Partners: Male    Birth control/protection: Post-menopausal    Comment: 1st intercourse 66 yo-Fewer than 5 partners (husband diagnosed with multiple myeloma)  Other Topics Concern   Not on file  Social History Narrative   Not on file   Social Determinants of Health   Financial Resource Strain: Low Risk  (02/06/2020)   Overall Financial Resource Strain (CARDIA)    Difficulty of Paying Living Expenses: Not hard at all  Food Insecurity: No Food Insecurity (02/06/2020)   Hunger Vital Sign    Worried About Running Out of Food in the Last Year: Never true    Ran Out of Food in the Last Year: Never true  Transportation Needs: No Transportation Needs (02/06/2020)   [place] - Transportation  Lack of Transportation (Medical): No    Lack of Transportation (Non-Medical): No  Physical Activity: Not on file  Stress: Not on file  Social Connections: Not on file  Intimate Partner Violence: Not on file   Family History  Problem Relation Age of Onset   Ovarian cancer Maternal Grandmother 34   Diverticulitis Mother    Heart disease Father    Kidney cancer Father 20   Stomach cancer Maternal Grandfather        dx 63s   Breast cancer Other        dx >50, maternal great-aunt   Breast cancer Other        dx >50, maternal great-aunt   Breast cancer Other        dx >50, maternal great-aunt   Colon cancer Neg Hx    Esophageal cancer Neg Hx    Rectal cancer Neg Hx     Objective: Office vital signs reviewed. BP 131/83   Pulse 72   Temp 98 F (36.7 C)   Ht _0  (1.651 m)   Wt 151 lb  3.2 oz (68.6 kg)   SpO2 98%   BMI 25.16 kg/m   Physical Examination:  General: Awake, alert, well nourished, No acute distress HEENT: PERRLA, EOMI.  Sclera white Cardio: regular rate and rhythm  Pulm: Normal work of breathing on room air MSK: Normal gait and station Neuro: No focal neurologic deficits.  Assessment/ Plan: 66 y.o. female   Other migraine without status migrainosus, not intractable - Plan: Galcanezumab-gnlm (EMGALITY) 120 MG/ML SOAJ, AMB Referral to Chronic Care Management Services  Need for pneumococcal 20-valent conjugate vaccination - Plan: Pneumococcal conjugate vaccine 20-valent (Prevnar 20)  Trial of Emgality.  Samples provided and instructions for use discussed.  I would like her to see CCM, clinical pharmacist in the next couple of weeks for recheck and discussion of patient assistance program for either Emgality or Nurtec.  Patient has failed multiple treatments in the past including various triptans, aimovig, Ajovy.  Cannot take tricyclic antidepressant because using SSRI for anxiety and depression.  Orders Placed This Encounter  Procedures   Pneumococcal conjugate vaccine 20-valent (Prevnar 20)   No orders of the defined types were placed in this encounter.    Janora Norlander, DO [place] [phone]

## 2022-01-23 ENCOUNTER — Telehealth: Payer: Medicare HMO | Admitting: Physician Assistant

## 2022-01-23 DIAGNOSIS — H1033 Unspecified acute conjunctivitis, bilateral: Secondary | ICD-10-CM

## 2022-01-23 MED ORDER — POLYMYXIN B-TRIMETHOPRIM 10000-0.1 UNIT/ML-% OP SOLN: OPHTHALMIC | 0 refills | Status: DC

## 2022-01-23 NOTE — Progress Notes (Signed)

## 2022-01-23 NOTE — Progress Notes (Signed)
I have spent 5 minutes in review of e-visit questionnaire, review and updating patient chart, medical decision making and response to patient.   Briarrose Shor Cody Jaylina Ramdass, PA-C    

## 2022-01-31 ENCOUNTER — Encounter: Payer: Self-pay | Admitting: Family Medicine

## 2022-02-03 ENCOUNTER — Ambulatory Visit: Payer: Medicare HMO | Admitting: Physician Assistant

## 2022-02-03 ENCOUNTER — Telehealth: Payer: Self-pay

## 2022-02-03 NOTE — Chronic Care Management (AMB) (Signed)
  Chronic Care Management   Outreach Note  02/03/2022 Name: Carolyn Cooper MRN: [id number] DOB: 1956-01-14  Carolyn Cooper is a 65 y.o. year old female who is a primary care patient of Janora Norlander, DO. I reached out to [name] by phone today in response to a referral sent by [name]'s primary care provider.  An unsuccessful telephone outreach was attempted today to contact the patient about Chronic Care Management needs.    Follow Up Plan: A HIPAA compliant phone message was left for the patient providing contact information and requesting a return call.  The care management team will reach out to the patient again over the next 7 days.  If patient returns call to provider office, please advise to call [place] * at [phone]*  [name], [place], [place] [address] Direct Dial: [phone] [email or web address]

## 2022-02-08 NOTE — Progress Notes (Signed)
  Chronic Care Management   Note  02/08/2022 Name: Amaree Loisel MRN: [id number] DOB: Dec 01, 1955  Carolyn Cooper is a 66 y.o. year old female who is a primary care patient of Janora Norlander, DO. I reached out to [name] by phone today in response to a referral sent by Ms. Calvert Cantor Guglielmo's PCP.  Ms. Rogan Ecklund was not successfully contacted today. A HIPAA compliant voice message was left requesting a return call.   Follow up plan: Additional outreach attempts will be made.  [name], [place], [place] [address] Direct Dial: [phone] [email or web address]

## 2022-02-16 NOTE — Progress Notes (Signed)
  Chronic Care Management   Note  02/16/2022 Name: Carolyn Cooper MRN: [id number] DOB: 29-Apr-1955  Carolyn Cooper is a 66 y.o. year old female who is a primary care patient of Janora Norlander, DO. I reached out to Carolyn Cooper by phone today in response to a referral sent by Carolyn Cooper's PCP.  Carolyn Cooper was not successfully contacted today. A HIPAA compliant voice message was left requesting a return call.   Follow up plan: Unable to reach patient after 3 attempts. No further outreach attempts will be made pending additional provider engagement, patient request, or new provider order.   [name], [place], [place] [address] Direct Dial: [phone] [email or web address]

## 2022-02-19 ENCOUNTER — Telehealth: Payer: Self-pay | Admitting: Family Medicine

## 2022-02-23 NOTE — Telephone Encounter (Signed)
Pt aware.

## 2022-02-23 NOTE — Telephone Encounter (Signed)
Please grab patient a sample from sample fridge if it is available Also, let patient know, emgality samples are frozen right now so we are not able to get at the moment.  We can always send in a prescription if needed

## 2022-03-27 ENCOUNTER — Other Ambulatory Visit: Payer: Self-pay | Admitting: Physician Assistant

## 2022-03-30 IMAGING — MG DIGITAL SCREENING BILAT W/ TOMO W/ CAD
8 series · 8 of 24 positions shown · non-contrast
Comparison: Previous exam(s).

CLINICAL DATA: Screening.

EXAM:
DIGITAL SCREENING BILATERAL MAMMOGRAM WITH TOMO AND CAD

[R MLO synth-2D]
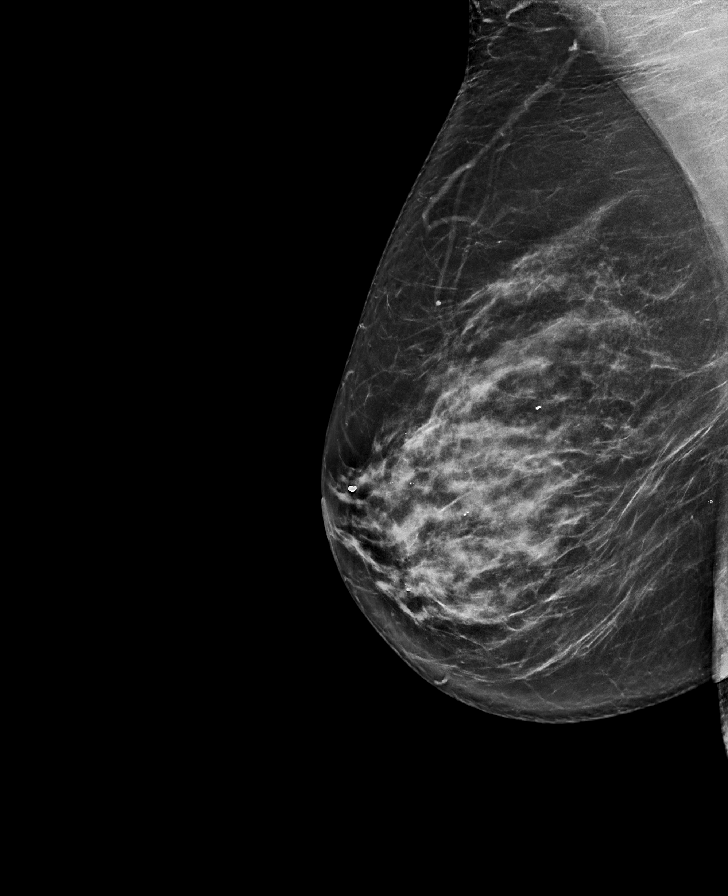

[R CC synth-2D]
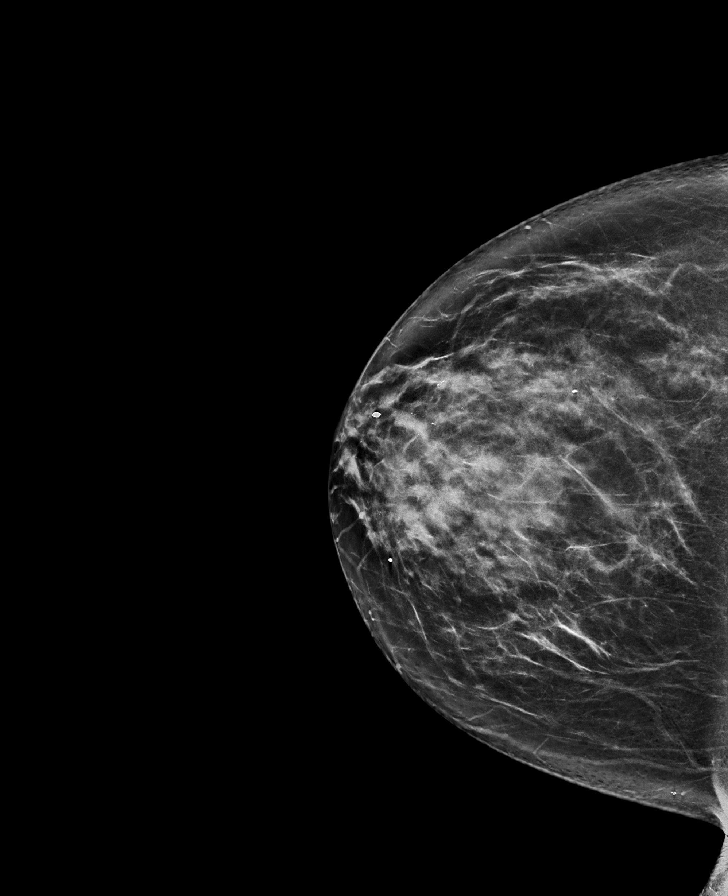

[L CC synth-2D]
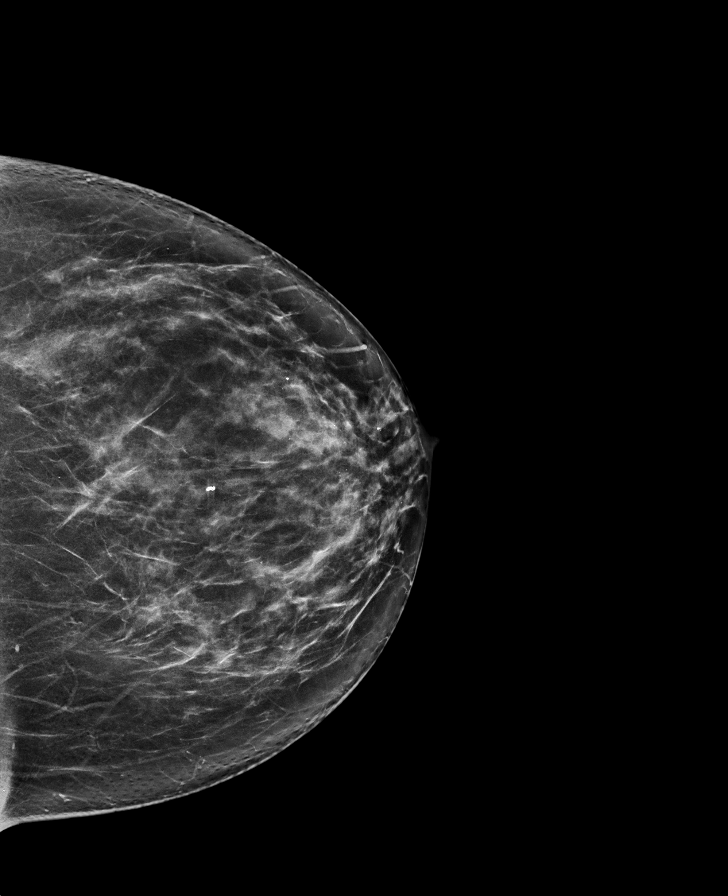

[L MLO synth-2D]
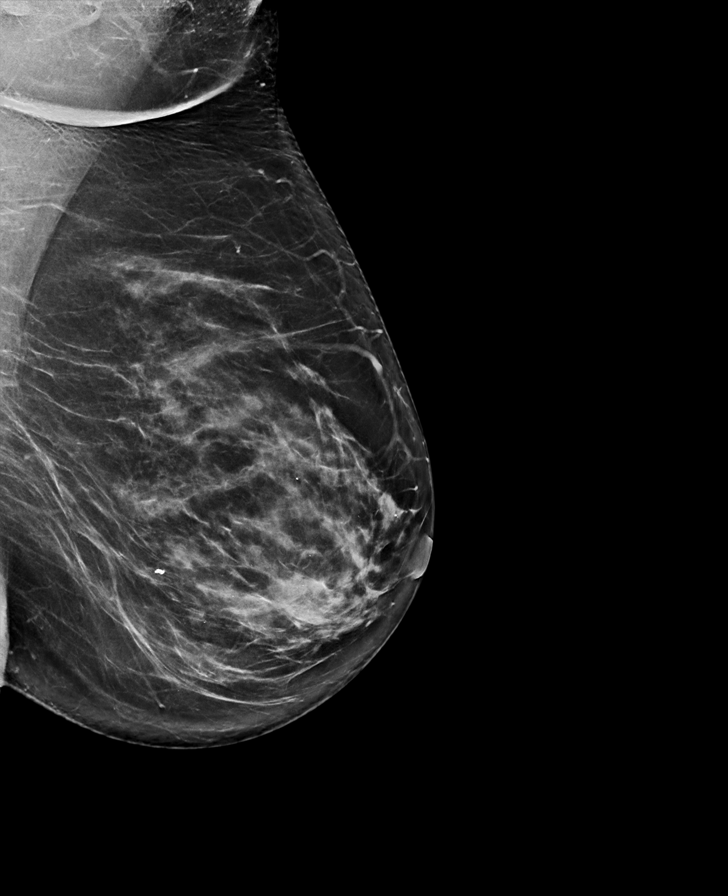

[R CC tomo · tomo slice 40/79.0]
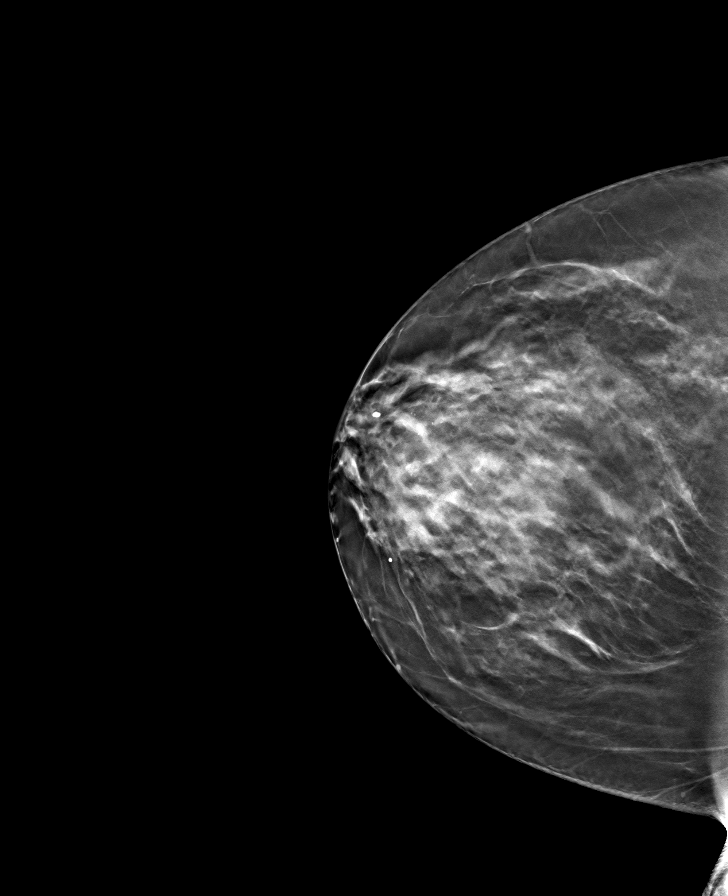

[L MLO tomo · tomo slice 43/84.0]
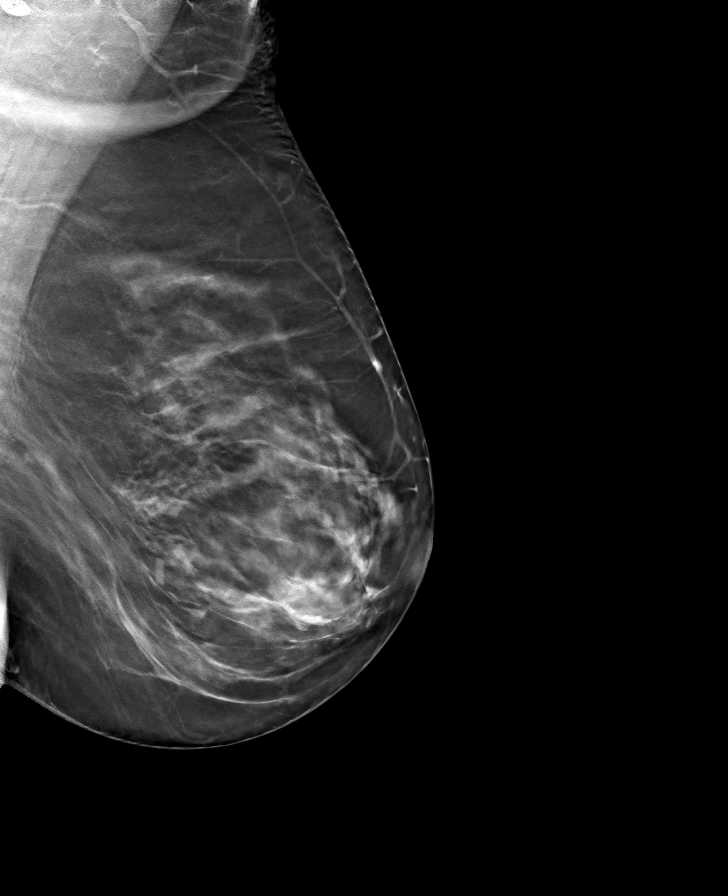

[R MLO tomo · tomo slice 41/82.0]
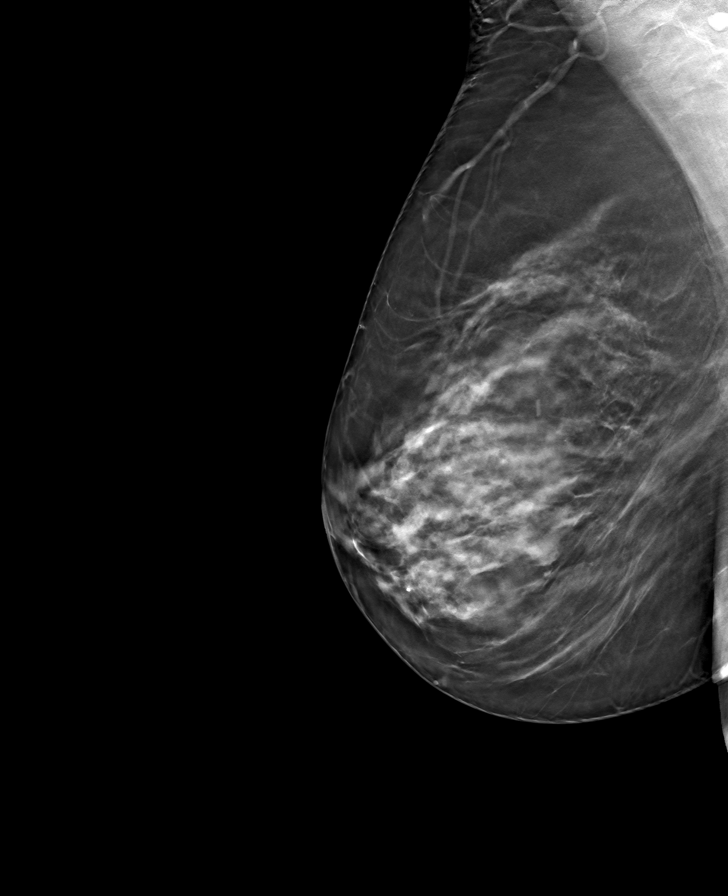

[L CC tomo · tomo slice 39/77.0]
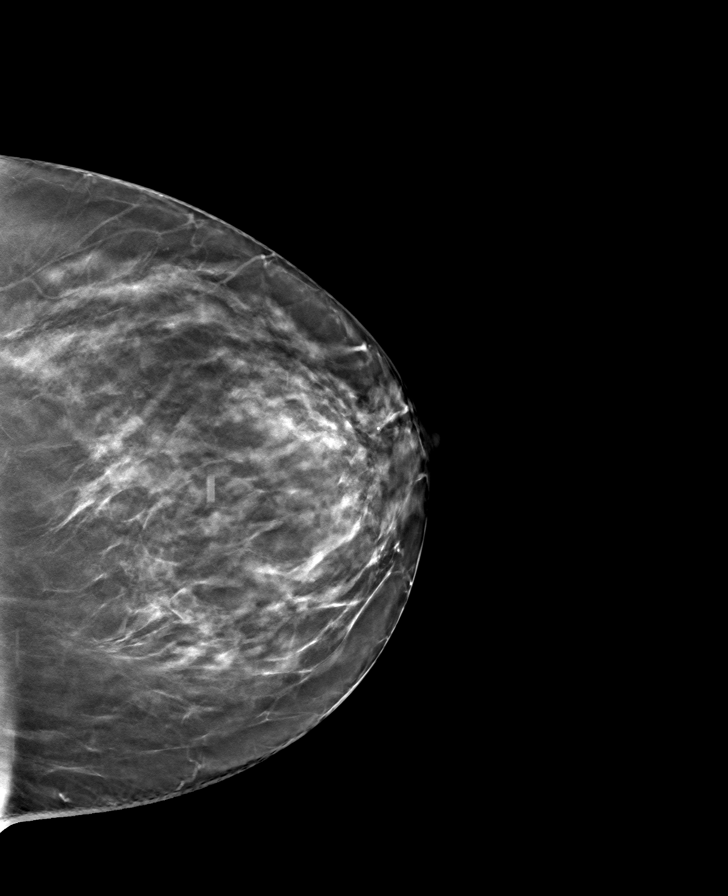

[8 of 24 positions shown; findings below may reference images not displayed]

ACR Breast Density Category c: The breast tissue is heterogeneously
dense, which may obscure small masses.
FINDINGS: In the left breast, a possible mass warrants further evaluation. In
the right breast, no findings suspicious for malignancy.

Images were processed with CAD.
IMPRESSION: Further evaluation is suggested for a possible mass in the left
breast.

RECOMMENDATION:
Diagnostic mammogram and possibly ultrasound of the left breast.
(Code:[id number])

The patient will be contacted regarding the findings, and additional
imaging will be scheduled.

BI-RADS CATEGORY  0: Incomplete. Need additional imaging evaluation
and/or prior mammograms for comparison.

## 2022-04-01 ENCOUNTER — Ambulatory Visit (INDEPENDENT_AMBULATORY_CARE_PROVIDER_SITE_OTHER): Payer: Medicare HMO | Admitting: Pharmacist

## 2022-04-01 DIAGNOSIS — G43809 Other migraine, not intractable, without status migrainosus: Secondary | ICD-10-CM | POA: Diagnosis not present

## 2022-04-02 ENCOUNTER — Encounter: Payer: Self-pay | Admitting: Pharmacist

## 2022-04-02 MED ORDER — EMGALITY 120 MG/ML ~~LOC~~ SOAJ: 120.0000 mg | SUBCUTANEOUS | 11 refills | Status: DC

## 2022-04-02 NOTE — Progress Notes (Signed)
  PharmD Medication Assistance   04/01/2022 Name: Carolyn Cooper MRN: [id number] DOB: 1955-08-07   Referred by: Janora Norlander, DO Reason for referral : Migraine  Patient stable on Emgality (has been using samples) for migraine prevention.  Will complete patient assistance for Emgality via [employer].  Patient reports having to use triptan for acute/breakthrough migraines, but they are less frequent. Mailed application for patient assistance.  Patient instructed to return application to practice.  Prescription was escribed to [facility] specialty mail order pharmacy on behalf of the [employer] patient assistance program.  Patient informed that we will no longer be getting Emgality samples.  Medications reviewed and medication list updated.  Current Outpatient Medications on File Prior to Visit  Medication Sig Dispense Refill   anastrozole (ARIMIDEX) 1 MG tablet Take 1 tablet (1 mg total) by mouth daily. 90 tablet 4   Cholecalciferol (VITAMIN D) 400 UNITS capsule Take 800 Units by mouth daily.     escitalopram (LEXAPRO) 20 MG tablet Take 1 tablet (20 mg total) by mouth daily. 90 tablet 3   famotidine (PEPCID) 40 MG tablet TAKE 1 TABLET BY MOUTH EVERYDAY AT BEDTIME 90 tablet 1   omeprazole (PRILOSEC) 40 MG capsule Take 1 capsule (40 mg total) by mouth daily. 30 capsule 11   SUMAtriptan (IMITREX) 100 MG tablet May repeat in 2 hours if headache persists or recurs. 9 tablet 12   No current facility-administered medications on file prior to visit.       Regina Eck, PharmD, BCPS, BCACP Clinical Pharmacist, [place]  II  T [phone]

## 2022-04-06 ENCOUNTER — Ambulatory Visit
Admission: RE | Admit: 2022-04-06 | Discharge: 2022-04-06 | Disposition: A | Discharge status: Home / Self Care | Payer: Medicare HMO | Source: Ambulatory Visit | Attending: Hematology and Oncology | Admitting: Hematology and Oncology

## 2022-04-06 DIAGNOSIS — Z170 Estrogen receptor positive status [ER+]: Secondary | ICD-10-CM

## 2022-04-06 DIAGNOSIS — Z780 Asymptomatic menopausal state: Secondary | ICD-10-CM | POA: Diagnosis not present

## 2022-04-06 DIAGNOSIS — M85852 Other specified disorders of bone density and structure, left thigh: Secondary | ICD-10-CM | POA: Diagnosis not present

## 2022-04-07 ENCOUNTER — Ambulatory Visit: Payer: Medicare HMO | Admitting: Dermatology

## 2022-04-09 DIAGNOSIS — M25562 Pain in left knee: Secondary | ICD-10-CM | POA: Diagnosis not present

## 2022-04-15 DIAGNOSIS — Z20822 Contact with and (suspected) exposure to covid-19: Secondary | ICD-10-CM | POA: Diagnosis not present

## 2022-04-16 DIAGNOSIS — Z20822 Contact with and (suspected) exposure to covid-19: Secondary | ICD-10-CM | POA: Diagnosis not present

## 2022-04-19 DIAGNOSIS — R69 Illness, unspecified: Secondary | ICD-10-CM | POA: Diagnosis not present

## 2022-04-19 DIAGNOSIS — F411 Generalized anxiety disorder: Secondary | ICD-10-CM | POA: Diagnosis not present

## 2022-04-23 ENCOUNTER — Inpatient Hospital Stay: Payer: Medicare HMO | Attending: Hematology and Oncology | Admitting: Hematology and Oncology

## 2022-04-23 ENCOUNTER — Encounter: Payer: Self-pay | Admitting: Hematology and Oncology

## 2022-04-23 DIAGNOSIS — M8580 Other specified disorders of bone density and structure, unspecified site: Secondary | ICD-10-CM

## 2022-04-23 DIAGNOSIS — C50412 Malignant neoplasm of upper-outer quadrant of left female breast: Secondary | ICD-10-CM

## 2022-04-23 DIAGNOSIS — Z170 Estrogen receptor positive status [ER+]: Secondary | ICD-10-CM

## 2022-04-23 NOTE — Progress Notes (Signed)
[place]  [phone] [phone]     ID: Carolyn Cooper DOB: 02-20-1956  MR#: [id number]  ZHY#:[id number]  Patient Care Team: Janora Norlander, DO as PCP - General (Family Medicine) Mauro Kaufmann, RN as Oncology Nurse Navigator Rockwell Germany, RN as Oncology Nurse Navigator Magrinat, Virgie Dad, MD (Inactive) as Consulting Physician (Oncology) Kyung Rudd, MD as Consulting Physician (Radiation Oncology) Erroll Luna, MD as Consulting Physician (General Surgery) Tamela Gammon, NP as Nurse Practitioner (Gynecology) Mauri Pole, MD as Consulting Physician (Gastroenterology) Lavonna Monarch, MD (Inactive) as Consulting Physician (Dermatology) Benay Pike, MD OTHER MD:   CHIEF COMPLAINT: Invasive lobular breast cancer  CURRENT TREATMENT: anastrozole  INTERVAL HISTORY: Carolyn Cooper returns today for follow up of her invasive lobular breast cancer.  She is tolerating anastrozole very well for adjuvant antiestrogen therapy.  No adverse effects reported. She is here for telephone visit to discuss bone density results. She has been taking calcium and vitamin D supplementation.  She is not exercising on a regular basis.  She has some dental issues and is scheduled for root canal treatment. Rest of the pertinent 10 point ROS reviewed and negative   COVID 19 VACCINATION STATUS: Status post 2 doses, booster pending as of January 2022   HISTORY OF CURRENT ILLNESS: From the original intake note:  "Carolyn Cooper" had routine screening mammography on 01/02/2020 showing a possible abnormality in the left breast. She underwent left diagnostic mammography with tomography and left breast ultrasonography at [facility] on 01/15/2020 showing: breast density category C; suspicious 3 mm mass in left breast at 2:30; no left lymphadenopathy.  Accordingly on 01/28/2020 she proceeded to biopsy of the left breast area in question. The pathology from this  procedure ([id number]) showed: invasive and in situ mammary carcinoma, e-cadherin negative, grade 2. Prognostic indicators significant for: estrogen receptor, >95% positive with strong staining intensity and progesterone receptor, 20% positive with moderate staining intensity. Proliferation marker Ki67 at 10%. HER2 equivocal by immunohistochemistry (2+), but negative by fluorescent in situ hybridization with a signals ratio 1.52 and number per cell 3.5.  The patient's subsequent history is as detailed below.   PAST MEDICAL HISTORY: Past Medical History:  Diagnosis Date   Allergy    seasonal   Anxiety    Barrett's esophagus    Breast cancer ([place]) 01/2020   left breast ILC   Cataract    had surgery   Depression    Family history of breast cancer    Family history of kidney cancer    Family history of ovarian cancer    Family history of stomach cancer    GERD (gastroesophageal reflux disease)    IBS (irritable bowel syndrome)    Migraines    Osteopenia 2019   T score -1.5 FRAX 8% / 0.7%   Personal history of radiation therapy     PAST SURGICAL HISTORY: Past Surgical History:  Procedure Laterality Date   BREAST LUMPECTOMY     BREAST LUMPECTOMY WITH RADIOACTIVE SEED AND SENTINEL LYMPH NODE BIOPSY Left 02/14/2020   Procedure: LEFT BREAST LUMPECTOMY WITH RADIOACTIVE SEED AND SENTINEL LYMPH NODE MAPPING;  Surgeon: Erroll Luna, MD;  Location: [facility];  Service: General;  Laterality: Left;  PECTORAL BLOCK   CATARACT EXTRACTION, BILATERAL  2018   [place]   COLONOSCOPY  07/21/2007   CYSTOSCOPY     MOUTH SURGERY  1978   WISDOM TEETH    FAMILY HISTORY: Family History  Problem Relation Age of Onset   Ovarian cancer Maternal Grandmother 42   Diverticulitis Mother    Heart disease Father    Kidney cancer Father 20   Stomach cancer Maternal Grandfather        dx 52s   Breast cancer Other        dx >50, maternal great-aunt   Breast cancer  Other        dx >50, maternal great-aunt   Breast cancer Other        dx >50, maternal great-aunt   Colon cancer Neg Hx    Esophageal cancer Neg Hx    Rectal cancer Neg Hx    Her parents are living as of 02/2020-- her father at age 71 and her mother at age 23. Carolyn Cooper has one sister (and no brothers). She notes her mother had previously undergone genetic testing for BRCA 1 and 2, and she was negative. She reports ovarian cancer in her maternal grandmother at age 5, stomach cancer in her maternal grandfather in his 21's, and kidney cancer in her dad at age 55.   GYNECOLOGIC HISTORY:  No LMP recorded. Patient is postmenopausal. Menarche: 67 years old Age at first live birth: 67 years old Blyn P 4 LMP 2015? Contraceptive: used "pretty much from 74 on" with no issues HRT used briefly, stopped "about 5 years ago"  Hysterectomy? no BSO? 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 Husband Remo Lipps works for [employer]. Daughter Denton Meek, age 5, is a Cabin crew here in [place]. Son Delrae Rend, age 14, is unemployed. Daughter Markus Jarvis, age 30, is a homemaker here in [place]. Son Berdine Dance, age 31, works in Chartered loss adjuster in [place]. Carolyn Cooper has 4 grandchildren. She attends a [employer].    ADVANCED DIRECTIVES: In the absence of any documentation to the contrary, the patient's spouse is their HCPOA.    HEALTH MAINTENANCE: Social History   Tobacco Use   Smoking status: Never   Smokeless tobacco: Never  Vaping Use   Vaping Use: Never used  Substance Use Topics   Alcohol use: Not Currently    Alcohol/week: 0.0 standard drinks of alcohol   Drug use: No     Colonoscopy: 10/2017 (Dr. Silverio Decamp), repeat 2024  PAP: 01/2018, negative  Bone density: 05/2017, -1.5   Allergies  Allergen Reactions   Sulfa Antibiotics Hives    Current Outpatient Medications  Medication Sig Dispense  Refill   anastrozole (ARIMIDEX) 1 MG tablet Take 1 tablet (1 mg total) by mouth daily. 90 tablet 4   Cholecalciferol (VITAMIN D) 400 UNITS capsule Take 800 Units by mouth daily.     escitalopram (LEXAPRO) 20 MG tablet Take 1 tablet (20 mg total) by mouth daily. 90 tablet 3   famotidine (PEPCID) 40 MG tablet TAKE 1 TABLET BY MOUTH EVERYDAY AT BEDTIME 90 tablet 1   Galcanezumab-gnlm (EMGALITY) 120 MG/ML SOAJ Inject 120 mg into the skin every 30 (thirty) days. 3 mL 11   omeprazole (PRILOSEC) 40 MG capsule Take 1 capsule (40 mg total) by mouth daily. 30 capsule 11   SUMAtriptan (IMITREX) 100 MG tablet May repeat in 2 hours if headache persists or recurs. 9 tablet 12   No current facility-administered medications for this visit.    OBJECTIVE: White woman in no acute distress  There were no vitals filed for this visit.     There is no height or weight on file  to calculate BMI.   Wt Readings from Last 3 Encounters:  01/19/22 151 lb 3.2 oz (68.6 kg)  12/30/21 156 lb 1 oz (70.8 kg)  10/05/21 149 lb 4.8 oz (67.7 kg)      ECOG FS:1 - Symptomatic but completely ambulatory Physical examination not done, telephone visit  LAB RESULTS:  CMP     Component Value Date/Time   NA 140 10/05/2021 0918   NA 141 07/27/2021 0841   K 4.1 10/05/2021 0918   CL 104 10/05/2021 0918   CO2 31 10/05/2021 0918   GLUCOSE 96 10/05/2021 0918   BUN 11 10/05/2021 0918   BUN 9 07/27/2021 0841   CREATININE 0.79 10/05/2021 0918   CREATININE 0.79 10/11/2012 1527   CALCIUM 9.8 10/05/2021 0918   PROT 7.1 10/05/2021 0918   PROT 6.6 07/27/2021 0841   ALBUMIN 4.4 10/05/2021 0918   ALBUMIN 4.4 07/27/2021 0841   AST 18 10/05/2021 0918   ALT 14 10/05/2021 0918   ALKPHOS 101 10/05/2021 0918   BILITOT 0.6 10/05/2021 0918   GFRNONAA >60 10/05/2021 0918   GFRAA >60 07/09/2019 0308    No results found for: "TOTALPROTELP", "ALBUMINELP", "A1GS", "A2GS", "BETS", "BETA2SER", "GAMS", "MSPIKE", "SPEI"  Lab Results   Component Value Date   WBC 5.3 10/05/2021   NEUTROABS 3.6 10/05/2021   HGB 13.1 10/05/2021   HCT 37.8 10/05/2021   MCV 96.9 10/05/2021   PLT 228 10/05/2021    No results found for: "LABCA2"  No components found for: "UUVOZD664"  No results for input(s): "INR" in the last 168 hours.  No results found for: "LABCA2"  No results found for: "QIH474"  No results found for: "CAN125"  No results found for: "CAN153"  No results found for: "CA2729"  No components found for: "HGQUANT"  No results found for: "CEA1", "CEA" / No results found for: "CEA1", "CEA"   No results found for: "AFPTUMOR"  No results found for: "CHROMOGRNA"  No results found for: "KPAFRELGTCHN", "LAMBDASER", "KAPLAMBRATIO" (kappa/lambda light chains)  No results found for: "HGBA", "HGBA2QUANT", "HGBFQUANT", "HGBSQUAN" (Hemoglobinopathy evaluation)   No results found for: "LDH"  No results found for: "IRON", "TIBC", "IRONPCTSAT" (Iron and TIBC)  No results found for: "FERRITIN"  Urinalysis    Component Value Date/Time   COLORURINE YELLOW 12/19/2015 1217   APPEARANCEUR Clear 10/18/2016 0859   LABSPEC 1.001 12/19/2015 1217   PHURINE 6.0 12/19/2015 1217   GLUCOSEU Negative 10/18/2016 0859   HGBUR NEGATIVE 12/19/2015 1217   BILIRUBINUR Negative 10/18/2016 0859   KETONESUR NEGATIVE 12/19/2015 1217   PROTEINUR Negative 10/18/2016 0859   PROTEINUR NEGATIVE 12/19/2015 1217   UROBILINOGEN 0.2 10/12/2013 1545   NITRITE Negative 10/18/2016 0859   NITRITE NEGATIVE 12/19/2015 1217   LEUKOCYTESUR 1+ (A) 10/18/2016 0859    STUDIES: DG Bone Density  Result Date: 04/06/2022 EXAM: DUAL X-RAY ABSORPTIOMETRY (DXA) FOR BONE MINERAL DENSITY IMPRESSION: Referring Physician:  Benay Pike Your patient completed a bone mineral density test using GE Lunar iDXA system (analysis version: 16). Technologist: lmn PATIENT: Name: Carolyn, Cooper Patient ID: [id number] Birth Date: 04/09/1955 Height: 65.0 in. Sex: Female  Measured: 04/06/2022 Weight: 153.8 lbs. Indications: Arimidex, Breast Cancer History, Caucasian, Estrogen Deficient, History of Osteopenia, Omeprazole, Postmenopausal, Secondary Osteoporosis Fractures: Left Ankle, Right Ankle Treatments: Calcium (E943.0), Vitamin D (E933.5) ASSESSMENT: The BMD measured at Femur Total Left is 0.748 g/cm2 with a T-score of -2.1. This patient is considered osteopenic/low bone mass according to [facility]) criteria. The quality of the exam is  good. Site Region Measured Date Measured Age YA BMD Significant CHANGE T-score DualFemur Total Left 04/06/2022    65.6         -2.1    0.748 g/cm2 AP Spine  L1-L4      04/06/2022    65.6         -0.9    1.075 g/cm2 DualFemur Total Mean 04/06/2022    65.6         -2.0    0.759 g/cm2 World Health Organization [facility]) criteria for post-menopausal, Caucasian Women: Normal       T-score at or above -1 SD Osteopenia   T-score between -1 and -2.5 SD Osteoporosis T-score at or below -2.5 SD RECOMMENDATION: 1. All patients should optimize calcium and vitamin D intake. 2. Consider FDA-approved medical therapies in postmenopausal women and men aged 33 years and older, based on the following: a. A hip or vertebral (clinical or morphometric) fracture. b. T-score = -2.5 at the femoral neck or spine after appropriate evaluation to exclude secondary causes. c. Low bone mass (T-score between -1.0 and -2.5 at the femoral neck or spine) and a 10-year probability of a hip fracture = 3% or a 10-year probability of a major osteoporosis-related fracture = 20% based on the [place]-adapted WHO algorithm. d. Clinician judgment and/or patient preferences may indicate treatment for people with 10-year fracture probabilities above or below these levels. FOLLOW-UP: Patients with diagnosis of osteoporosis or at high risk for fracture should have regular bone mineral density tests.? Patients eligible for Medicare are allowed routine testing every 2 years.? The  testing frequency can be increased to one year for patients who have rapidly progressing disease, are receiving or discontinuing medical therapy to restore bone mass, or have additional risk factors. I have reviewed this study and agree with the findings. [facility] FRAX* 10-year Probability of Fracture Based on femoral neck BMD: DualFemur (Left) Major Osteoporotic Fracture: 10.9% Hip Fracture:                1.7% Population:                  [place] (Caucasian) Risk Factors:                Secondary Osteoporosis *FRAX is a [place] of [employer] for Metabolic Bone Disease, a [place] (WHO) [facility]. ASSESSMENT: The probability of a major osteoporotic fracture is 10.9% within the next ten years. The probability of a hip fracture is 1.7% within the next ten years. Electronically Signed   By: Ammie Ferrier M.D.   On: 04/06/2022 09:07    ELIGIBLE FOR AVAILABLE RESEARCH PROTOCOL: AET  ASSESSMENT: 67 y.o. [place] woman status post left breast upper outer quadrant biopsy 01/28/2020 for a clinical T1a/b N0, stage IA invasive lobular carcinoma, E-cadherin negative, estrogen and progesterone receptor positive, HER-2 not amplified, with an MIB-1 of 10%.  (1) status post left lumpectomy and sentinel lymph node sampling 02/14/2020 for a pT1c pN0, stage IA invasive lobular carcinoma, grade 2, with negative margins  (a) 2 left axillary lymph nodes were removed  (2) Oncotype score of 16 predicts a risk of recurrence outside the breast in the next 9 years of 4% if the patient's only systemic therapy is antiestrogens for 5 years.  It also predicts no benefit from chemotherapy  (3) adjuvant radiation: Radiation Treatment Dates: 04/08/2020 through 05/05/2020 Site Technique Total Dose (Gy) Dose per Fx (Gy) Completed Fx Beam Energies  Breast, Left: Breast_Lt 3D 42.56/42.56 2.66 16/16 6X, 10X  Breast, Left: Breast_Lt_Bst 3D 8/8 2 4/4 6X,  10X   (4) started anastrozole 05/19/2020  (a) bone density 07/07/2020 shows a T score of -1.4 (minimal osteopenia) mid June 2023, labs same day   PLAN: Patient is here for telephone visit to review bone density results.  She is compliant with anastrozole and denies any adverse effects.  Most recent bone density showed a T-score of -2.1, worse compared to 2022.  Once again discussed about bisphosphonate, calcium and vitamin D supplementation and weightbearing exercises.  She is having some root canal treatment and is working with her dentist.  We have discussed about mechanism of action for bisphosphonates and adverse effects including osteonecrosis and hypocalcemia.  Will try to fax the dental clearance to Dr. Marlyce Huge in [place] and obtain his recommendations.  In the interim she will start working on some exercise regimen. Total time spent : 12 minutes.  I connected with  Carolyn Cooper on 04/23/22 by a telephone application and verified that I am speaking with the correct person using two identifiers.   I discussed the limitations of evaluation and management by telemedicine. The patient expressed understanding and agreed to proceed.    *Total Encounter Time as defined by the Centers for Medicare and Medicaid Services includes, in addition to the face-to-face time of a patient visit (documented in the note above) non-face-to-face time: obtaining and reviewing outside history, ordering and reviewing medications, tests or procedures, care coordination (communications with other health care professionals or caregivers) and documentation in the medical record.

## 2022-04-27 DIAGNOSIS — R69 Illness, unspecified: Secondary | ICD-10-CM | POA: Diagnosis not present

## 2022-04-27 DIAGNOSIS — F411 Generalized anxiety disorder: Secondary | ICD-10-CM | POA: Diagnosis not present

## 2022-04-30 DIAGNOSIS — Z20822 Contact with and (suspected) exposure to covid-19: Secondary | ICD-10-CM | POA: Diagnosis not present

## 2022-05-02 DIAGNOSIS — Z20822 Contact with and (suspected) exposure to covid-19: Secondary | ICD-10-CM | POA: Diagnosis not present

## 2022-05-04 DIAGNOSIS — Z20822 Contact with and (suspected) exposure to covid-19: Secondary | ICD-10-CM | POA: Diagnosis not present

## 2022-05-05 DIAGNOSIS — Z20822 Contact with and (suspected) exposure to covid-19: Secondary | ICD-10-CM | POA: Diagnosis not present

## 2022-05-06 ENCOUNTER — Ambulatory Visit (INDEPENDENT_AMBULATORY_CARE_PROVIDER_SITE_OTHER): Payer: Medicare HMO | Admitting: Nurse Practitioner

## 2022-05-06 ENCOUNTER — Encounter: Payer: Self-pay | Admitting: Nurse Practitioner

## 2022-05-06 ENCOUNTER — Other Ambulatory Visit (HOSPITAL_COMMUNITY)
Admission: RE | Admit: 2022-05-06 | Discharge: 2022-05-06 | Disposition: A | Discharge status: Home / Self Care | Payer: Medicare HMO | Source: Ambulatory Visit | Attending: Nurse Practitioner | Admitting: Nurse Practitioner

## 2022-05-06 VITALS — BP 120/74 | HR 70 | Ht 64.75 in | Wt 156.0 lb

## 2022-05-06 DIAGNOSIS — F411 Generalized anxiety disorder: Secondary | ICD-10-CM | POA: Diagnosis not present

## 2022-05-06 DIAGNOSIS — Z9189 Other specified personal risk factors, not elsewhere classified: Secondary | ICD-10-CM | POA: Diagnosis not present

## 2022-05-06 DIAGNOSIS — Z01419 Encounter for gynecological examination (general) (routine) without abnormal findings: Secondary | ICD-10-CM

## 2022-05-06 DIAGNOSIS — M85852 Other specified disorders of bone density and structure, left thigh: Secondary | ICD-10-CM

## 2022-05-06 DIAGNOSIS — Z20822 Contact with and (suspected) exposure to covid-19: Secondary | ICD-10-CM | POA: Diagnosis not present

## 2022-05-06 DIAGNOSIS — Z780 Asymptomatic menopausal state: Secondary | ICD-10-CM | POA: Diagnosis not present

## 2022-05-06 DIAGNOSIS — Z853 Personal history of malignant neoplasm of breast: Secondary | ICD-10-CM

## 2022-05-06 DIAGNOSIS — Z9289 Personal history of other medical treatment: Secondary | ICD-10-CM

## 2022-05-06 DIAGNOSIS — Z124 Encounter for screening for malignant neoplasm of cervix: Secondary | ICD-10-CM | POA: Diagnosis not present

## 2022-05-06 DIAGNOSIS — R69 Illness, unspecified: Secondary | ICD-10-CM | POA: Diagnosis not present

## 2022-05-06 DIAGNOSIS — Z1151 Encounter for screening for human papillomavirus (HPV): Secondary | ICD-10-CM | POA: Insufficient documentation

## 2022-05-06 NOTE — Progress Notes (Signed)
Carolyn Cooper June 12, 1955 [id number]   History:  67 y.o. G4P4 presents for breast and pelvic exam. Postmenopausal - no HRT, no bleeding. 1996 cone biopsy. 01/2020 ER+ left breast cancer managed with lumpectomy, radiation, and aromatase inhibitor.   Gynecologic History No LMP recorded. Patient is postmenopausal.   Contraception: post menopausal status Sexually active: Yes  Health maintenance Last Pap: 01/05/2018. Results were: Normal neg HPV, 5-year repeat Last mammogram: 08/27/2021. Results were: Postsurgical changes, negative for malignancy Last colonoscopy: 10/24/2017. Results were: Benign polyps, 5-year recall Last Dexa: 04/06/2022. Results were: T-score -2.1, FRAX 10.9% / 1.7%  Past medical history, past surgical history, family history and social history were all reviewed and documented in the EPIC chart. Married. Husband had multiple myeloma, doing well. Retired. 4 children, 4 grandchildren ages 21, 65, 13, and 80. All live local.   ROS:  A ROS was performed and pertinent positives and negatives are included.  Exam:  Vitals:   05/06/22 1027  BP: 120/74  Pulse: 70  SpO2: 99%  Weight: 156 lb (70.8 kg)  Height: 5' 4.75" (1.645 m)     Body mass index is 26.16 kg/m.  General appearance:  Normal Thyroid:  Symmetrical, normal in size, without palpable masses or nodularity. Respiratory  Auscultation:  Clear without wheezing or rhonchi Cardiovascular  Auscultation:  Regular rate, without rubs, murmurs or gallops  Edema/varicosities:  Not grossly evident Abdominal  Soft,nontender, without masses, guarding or rebound.  Liver/spleen:  No organomegaly noted  Hernia:  None appreciated  Skin  Inspection:  Grossly normal   Breasts: Examined lying and sitting.   Right: Without masses, retractions, discharge or axillary adenopathy.   Left: Without masses, retractions, discharge or axillary adenopathy. Genitourinary   Inguinal/mons:  Normal without inguinal  adenopathy  External genitalia:  Normal appearing vulva with no masses, tenderness, or lesions  BUS/Urethra/Skene's glands:  Normal  Vagina:  Normal appearing with normal color and discharge, no lesions. Atrophic changes  Cervix:  Normal appearing without discharge or lesions. Stenosis  Uterus:  Normal in size, shape and contour.  Midline and mobile, nontender  Adnexa/parametria:     Rt: Normal in size, without masses or tenderness.   Lt: Normal in size, without masses or tenderness.  Anus and perineum: Large non-bleeding hemorrhoids  Digital rectal exam: Deferred  Patient informed chaperone available to be present for breast and pelvic exam. Patient has requested no chaperone to be present. Patient has been advised what will be completed during breast and pelvic exam.   Assessment/Plan:  67 y.o. G4P4 for breast and pelvic exam.   Encounter for breast and pelvic examination - Plan: Cytology - PAP( [name]). Education provided on SBEs, importance of preventative screenings, current guidelines, high calcium diet, regular exercise, and multivitamin daily.  Labs with PCP.   Postmenopausal - no HRT, no bleeding  Osteopenia of neck of left femur - T-score -2.1 without elevated FRAX 04/2022. Continue Vitamin D + Calcium and increase exercise. Oncology considering starting her on bisphosphonate.   History of left breast cancer - 01/2020 ER+ left breast cancer managed with lumpectomy, radiation, and aromatase inhibitor. Seeing oncology. Continue annual screenings. Normal mammogram 08/2021. Normal breast exam today.   Screening for cervical cancer -1996 cone ciopsy, otherwise normal palpation. Pap today. If normal we discussed option to stop screenings per guidelines.   Screening for colon cancer - 2019 colonoscopy. Will repeat at 5-year interval per GI's recommendation.   Follow up in 1 year for breast and pelvic exam.  Tamela Gammon [facility], 11:03 AM 05/06/2022

## 2022-05-09 DIAGNOSIS — Z20822 Contact with and (suspected) exposure to covid-19: Secondary | ICD-10-CM | POA: Diagnosis not present

## 2022-05-10 LAB — CYTOLOGY - PAP
Comment: NEGATIVE
Diagnosis: NEGATIVE
High risk HPV: NEGATIVE

## 2022-05-13 ENCOUNTER — Encounter: Payer: Self-pay | Admitting: Hematology and Oncology

## 2022-05-14 DIAGNOSIS — Z20822 Contact with and (suspected) exposure to covid-19: Secondary | ICD-10-CM | POA: Diagnosis not present

## 2022-05-16 DIAGNOSIS — Z20822 Contact with and (suspected) exposure to covid-19: Secondary | ICD-10-CM | POA: Diagnosis not present

## 2022-05-18 DIAGNOSIS — F411 Generalized anxiety disorder: Secondary | ICD-10-CM | POA: Diagnosis not present

## 2022-05-18 DIAGNOSIS — R69 Illness, unspecified: Secondary | ICD-10-CM | POA: Diagnosis not present

## 2022-05-25 DIAGNOSIS — F411 Generalized anxiety disorder: Secondary | ICD-10-CM | POA: Diagnosis not present

## 2022-06-16 ENCOUNTER — Telehealth: Payer: Self-pay

## 2022-06-16 NOTE — Telephone Encounter (Signed)
Submitted application for [facility] to [place] for patient assistance.   Phone: [phone]

## 2022-06-20 ENCOUNTER — Encounter: Payer: Self-pay | Admitting: Family Medicine

## 2022-06-20 DIAGNOSIS — G43809 Other migraine, not intractable, without status migrainosus: Secondary | ICD-10-CM

## 2022-06-21 ENCOUNTER — Other Ambulatory Visit: Payer: Self-pay | Admitting: Family Medicine

## 2022-06-21 DIAGNOSIS — G43809 Other migraine, not intractable, without status migrainosus: Secondary | ICD-10-CM

## 2022-06-21 MED ORDER — EMGALITY 120 MG/ML ~~LOC~~ SOAJ: 120.0000 mg | SUBCUTANEOUS | 11 refills | Status: DC

## 2022-06-21 NOTE — Telephone Encounter (Signed)
Rx sent to local pharmacy as pt is out and has not heard back from PAP.  Will route to PharmD/ CCM as well.

## 2022-06-21 NOTE — Telephone Encounter (Signed)
She's been getting this with PAP.  She wanted to see how much it would be thru her own ins.  Looks like they won't even cover this one.  Please let her know I'm checking with Almyra Free to see if we have any samples/ can give a status update on that PAP.

## 2022-06-23 ENCOUNTER — Encounter: Payer: Self-pay | Admitting: Pharmacist

## 2022-06-24 ENCOUNTER — Encounter: Payer: Self-pay | Admitting: Pharmacist

## 2022-07-01 NOTE — Telephone Encounter (Signed)
Received notification from [place] regarding approval for [facility]. Patient assistance approved from 06/16/22 to 04/05/23.  Phone: [phone]

## 2022-07-09 DIAGNOSIS — M1712 Unilateral primary osteoarthritis, left knee: Secondary | ICD-10-CM | POA: Diagnosis not present

## 2022-07-13 ENCOUNTER — Ambulatory Visit (INDEPENDENT_AMBULATORY_CARE_PROVIDER_SITE_OTHER): Payer: Medicare HMO | Admitting: Family Medicine

## 2022-07-13 ENCOUNTER — Encounter: Payer: Self-pay | Admitting: Family Medicine

## 2022-07-13 VITALS — BP 125/71 | HR 71 | Temp 98.7°F | Ht 64.0 in | Wt 156.0 lb

## 2022-07-13 DIAGNOSIS — R5382 Chronic fatigue, unspecified: Secondary | ICD-10-CM

## 2022-07-13 DIAGNOSIS — R6889 Other general symptoms and signs: Secondary | ICD-10-CM | POA: Diagnosis not present

## 2022-07-13 DIAGNOSIS — G43709 Chronic migraine without aura, not intractable, without status migrainosus: Secondary | ICD-10-CM

## 2022-07-13 DIAGNOSIS — E559 Vitamin D deficiency, unspecified: Secondary | ICD-10-CM | POA: Diagnosis not present

## 2022-07-13 LAB — IRON,TIBC AND FERRITIN PANEL

## 2022-07-13 MED ORDER — RIZATRIPTAN BENZOATE 10 MG PO TABS
10.0000 mg | ORAL_TABLET | ORAL | 1 refills | Status: DC | PRN
Start: 2022-07-13 — End: 2022-08-11

## 2022-07-13 NOTE — Progress Notes (Signed)
Subjective: CC: Follow-up migraine headaches PCP: Raliegh IpGottschalk, Jozelynn Danielson M, DO YNW:GNFAOHPI:Carolyn Derrick RavelRussell Sosnowski is a 67 y.o. female presenting to clinic today for:  1.  Migraine headaches Patient reports that she was doing well on the [facility] but unfortunately he she has not been able to secure the samples through the patient assistance program for over 3 months now.  She is subsequently had increasing frequency of migraine headaches and used up her 9 tablets of Imitrex for the month.  She is asking for alternate triptan or other abortive medication until she can secure the samples.  2.  Chronic fatigue Patient reports this has been ongoing for a while now.  She often will sleep a good 9 hours and rarely wakes up but when she does it is briefly.  She has been observed snoring occasionally but no apneic spells observed.  She wonders if the fatigue is related to use of Arimidex because it also has caused some joint aching.  Denies any changes in bowel habits.  No heart palpitations or tremors reported.   ROS: Per HPI  Allergies  Allergen Reactions   Sulfa Antibiotics Hives   Past Medical History:  Diagnosis Date   Allergy    seasonal   Anxiety    Barrett's esophagus    Breast cancer 01/2020   left breast ILC   Cataract    had surgery   Depression    Family history of breast cancer    Family history of kidney cancer    Family history of ovarian cancer    Family history of stomach cancer    GERD (gastroesophageal reflux disease)    IBS (irritable bowel syndrome)    Migraines    Osteopenia 2019   T score -1.5 FRAX 8% / 0.7%   Personal history of radiation therapy     Current Outpatient Medications:    anastrozole (ARIMIDEX) 1 MG tablet, Take 1 tablet (1 mg total) by mouth daily., Disp: 90 tablet, Rfl: 4   Calcium Citrate-Vitamin D (CALCIUM + D PO), Take by mouth., Disp: , Rfl:    escitalopram (LEXAPRO) 20 MG tablet, Take 1 tablet (20 mg total) by mouth daily., Disp: 90 tablet, Rfl: 3    Galcanezumab-gnlm (EMGALITY) 120 MG/ML SOAJ, Inject 120 mg into the skin every 30 (thirty) days., Disp: 3 mL, Rfl: 11   omeprazole (PRILOSEC) 40 MG capsule, Take 1 capsule (40 mg total) by mouth daily., Disp: 30 capsule, Rfl: 11   rizatriptan (MAXALT) 10 MG tablet, Take 1 tablet (10 mg total) by mouth as needed for migraine. May repeat in 2 hours if needed, Disp: 10 tablet, Rfl: 1 Social History   Socioeconomic History   Marital status: Married    Spouse name: Not on file   Number of children: Not on file   Years of education: Not on file   Highest education level: Associate degree: occupational, Scientist, product/process developmenttechnical, or vocational program  Occupational History   Not on file  Tobacco Use   Smoking status: Never   Smokeless tobacco: Never  Vaping Use   Vaping Use: Never used  Substance and Sexual Activity   Alcohol use: Not Currently    Alcohol/week: 0.0 standard drinks of alcohol   Drug use: No   Sexual activity: Not Currently    Partners: Male    Birth control/protection: Post-menopausal    Comment: 1st intercourse 67 yo-Fewer than 5 partners (husband diagnosed with multiple myeloma)  Other Topics Concern   Not on file  Social History Narrative  Not on file   Social Determinants of Health   Financial Resource Strain: Low Risk  (07/09/2022)   Overall Financial Resource Strain (CARDIA)    Difficulty of Paying Living Expenses: Not hard at all  Food Insecurity: No Food Insecurity (07/09/2022)   Hunger Vital Sign    Worried About Running Out of Food in the Last Year: Never true    Ran Out of Food in the Last Year: Never true  Transportation Needs: No Transportation Needs (07/09/2022)   PRAPARE - Administrator, Civil Service (Medical): No    Lack of Transportation (Non-Medical): No  Physical Activity: Insufficiently Active (07/09/2022)   Exercise Vital Sign    Days of Exercise per Week: 1 day    Minutes of Exercise per Session: 60 min  Stress: No Stress Concern Present (07/09/2022)    [employer] of Occupational Health - Occupational Stress Questionnaire    Feeling of Stress : Not at all  Social Connections: Socially Integrated (07/09/2022)   Social Connection and Isolation Panel [NHANES]    Frequency of Communication with Friends and Family: More than three times a week    Frequency of Social Gatherings with Friends and Family: Twice a week    Attends Religious Services: More than 4 times per year    Active Member of [employer] or Organizations: Yes    Attends Engineer, structural: More than 4 times per year    Marital Status: Married  Catering manager Violence: Not on file   Family History  Problem Relation Age of Onset   Ovarian cancer Maternal Grandmother 102   Diverticulitis Mother    Heart disease Father    Kidney cancer Father 65   Stomach cancer Maternal Grandfather        dx 53s   Breast cancer Other        dx >50, maternal great-aunt   Breast cancer Other        dx >50, maternal great-aunt   Breast cancer Other        dx >50, maternal great-aunt   Colon cancer Neg Hx    Esophageal cancer Neg Hx    Rectal cancer Neg Hx     Objective: Office vital signs reviewed. BP 125/71   Pulse 71   Temp 98.7 F (37.1 C)   Ht 5\' 4"  (1.626 m)   Wt 156 lb (70.8 kg)   SpO2 98%   BMI 26.78 kg/m   Physical Examination:  General: Awake, alert, well nourished, No acute distress HEENT: No thyromegaly, thyroid nodules or other masses or lymphadenopathy noted in the neck.  No exophthalmos.  She does not have a predominant chin but no gross enlargement of the neck Cardio: regular rate and rhythm, S1S2 heard, no murmurs appreciated Pulm: clear to auscultation bilaterally, no wheezes, rhonchi or rales; normal work of breathing on room air Neuro: No focal neurologic deficits.  Alert and oriented x 3  Assessment/ Plan: 68 y.o. female   Chronic fatigue - Plan: TSH, T4, Free, CBC, Iron, TIBC and Ferritin Panel, Vitamin B12  Chronic migraine without aura  without status migrainosus, not intractable - Plan: rizatriptan (MAXALT) 10 MG tablet  Vitamin D deficiency - Plan: VITAMIN D 25 Hydroxy (Vit-D Deficiency, Fractures)  Check for metabolic etiology of chronic fatigue.  Will plan for home sleep study if laboratory workup is negative  Unfortunately still has not received medication from patient assistance program but we reached out to clinical pharmacy and she is checking in  on this.  I will give her a prescription of Maxalt that she has utilized all of her Imitrex.  Also gave her a couple of samples of Ubrelvy.  Discussed utilization of one of the other but not both.  Orders Placed This Encounter  Procedures   TSH   T4, Free   CBC   Iron, TIBC and Ferritin Panel   Vitamin B12   VITAMIN D 25 Hydroxy (Vit-D Deficiency, Fractures)   Meds ordered this encounter  Medications   rizatriptan (MAXALT) 10 MG tablet    Sig: Take 1 tablet (10 mg total) by mouth as needed for migraine. May repeat in 2 hours if needed    Dispense:  10 tablet    Refill:  1     Anysia Choi Hulen Skains, DO [facility] [phone]

## 2022-07-14 ENCOUNTER — Other Ambulatory Visit: Payer: Self-pay | Admitting: Family Medicine

## 2022-07-14 DIAGNOSIS — R0683 Snoring: Secondary | ICD-10-CM

## 2022-07-14 DIAGNOSIS — R5382 Chronic fatigue, unspecified: Secondary | ICD-10-CM

## 2022-07-14 LAB — CBC
Hematocrit: 38.1 % (ref 34.0–46.6)
Hemoglobin: 13.1 g/dL (ref 11.1–15.9)
MCH: 32.8 pg (ref 26.6–33.0)
MCHC: 34.4 g/dL (ref 31.5–35.7)
MCV: 96 fL (ref 79–97)
Platelets: 287 10*3/uL (ref 150–450)
RBC: 3.99 x10E6/uL (ref 3.77–5.28)
RDW: 11.3 % — ABNORMAL LOW (ref 11.7–15.4)
WBC: 6.9 10*3/uL (ref 3.4–10.8)

## 2022-07-14 LAB — TSH: TSH: 3.13 u[IU]/mL (ref 0.450–4.500)

## 2022-07-14 LAB — VITAMIN D 25 HYDROXY (VIT D DEFICIENCY, FRACTURES): Vit D, 25-Hydroxy: 60.8 ng/mL (ref 30.0–100.0)

## 2022-07-14 LAB — IRON,TIBC AND FERRITIN PANEL
Ferritin: 159 ng/mL — ABNORMAL HIGH (ref 15–150)
Iron: 109 ug/dL (ref 27–139)
UIBC: 118 ug/dL (ref 118–369)

## 2022-07-14 LAB — T4, FREE: Free T4: 1.14 ng/dL (ref 0.82–1.77)

## 2022-07-14 LAB — VITAMIN B12: Vitamin B-12: 525 pg/mL (ref 232–1245)

## 2022-07-25 ENCOUNTER — Other Ambulatory Visit: Payer: Self-pay | Admitting: Family Medicine

## 2022-07-25 DIAGNOSIS — G43709 Chronic migraine without aura, not intractable, without status migrainosus: Secondary | ICD-10-CM

## 2022-07-26 ENCOUNTER — Other Ambulatory Visit: Payer: Self-pay | Admitting: Hematology and Oncology

## 2022-07-26 DIAGNOSIS — C50412 Malignant neoplasm of upper-outer quadrant of left female breast: Secondary | ICD-10-CM

## 2022-07-26 DIAGNOSIS — G4710 Hypersomnia, unspecified: Secondary | ICD-10-CM | POA: Diagnosis not present

## 2022-07-27 DIAGNOSIS — G4710 Hypersomnia, unspecified: Secondary | ICD-10-CM | POA: Diagnosis not present

## 2022-08-09 ENCOUNTER — Other Ambulatory Visit: Payer: Self-pay | Admitting: Family Medicine

## 2022-08-09 DIAGNOSIS — G43709 Chronic migraine without aura, not intractable, without status migrainosus: Secondary | ICD-10-CM

## 2022-08-11 ENCOUNTER — Other Ambulatory Visit: Payer: Self-pay | Admitting: Family Medicine

## 2022-08-11 ENCOUNTER — Encounter: Payer: Self-pay | Admitting: Family Medicine

## 2022-08-11 ENCOUNTER — Other Ambulatory Visit: Payer: Self-pay

## 2022-08-11 DIAGNOSIS — G43709 Chronic migraine without aura, not intractable, without status migrainosus: Secondary | ICD-10-CM

## 2022-08-11 MED ORDER — SUMATRIPTAN SUCCINATE 100 MG PO TABS: ORAL_TABLET | ORAL | 12 refills | Status: DC

## 2022-08-11 NOTE — Progress Notes (Signed)
Meds ordered this encounter  Medications   SUMAtriptan (IMITREX) 100 MG tablet    Sig: May repeat in 2 hours if headache persists or recurs.    Dispense:  9 tablet    Refill:  12

## 2022-08-12 ENCOUNTER — Encounter: Payer: Self-pay | Admitting: Hematology and Oncology

## 2022-08-12 ENCOUNTER — Other Ambulatory Visit: Payer: Self-pay | Admitting: *Deleted

## 2022-08-12 ENCOUNTER — Encounter: Payer: Self-pay | Admitting: Family Medicine

## 2022-08-12 MED ORDER — ANASTROZOLE 1 MG PO TABS: 1.0000 mg | ORAL_TABLET | Freq: Every day | ORAL | 4 refills | Status: DC

## 2022-08-16 NOTE — Telephone Encounter (Signed)
Yes, she did not discuss this at her last visit.  A video is fine if she wants to do that.

## 2022-08-17 ENCOUNTER — Telehealth: Payer: Self-pay | Admitting: Family Medicine

## 2022-08-17 NOTE — Telephone Encounter (Signed)
Contacted Carolyn Cooper to schedule their annual wellness visit. Appointment made for 08/31/2022.  Thank you,  Judeth Cornfield,  AMB Clinical Support [facility] AWV Program Direct Dial ??[phone]

## 2022-08-18 ENCOUNTER — Ambulatory Visit: Payer: Medicare HMO

## 2022-08-23 ENCOUNTER — Encounter: Payer: Self-pay | Admitting: Family Medicine

## 2022-08-23 ENCOUNTER — Telehealth (INDEPENDENT_AMBULATORY_CARE_PROVIDER_SITE_OTHER): Payer: Medicare HMO | Admitting: Family Medicine

## 2022-08-23 VITALS — Ht 65.0 in | Wt 154.0 lb

## 2022-08-23 DIAGNOSIS — E7800 Pure hypercholesterolemia, unspecified: Secondary | ICD-10-CM | POA: Diagnosis not present

## 2022-08-23 DIAGNOSIS — Z713 Dietary counseling and surveillance: Secondary | ICD-10-CM | POA: Diagnosis not present

## 2022-08-23 DIAGNOSIS — Z6825 Body mass index (BMI) 25.0-25.9, adult: Secondary | ICD-10-CM | POA: Diagnosis not present

## 2022-08-23 DIAGNOSIS — E663 Overweight: Secondary | ICD-10-CM | POA: Diagnosis not present

## 2022-08-23 MED ORDER — PHENTERMINE HCL 37.5 MG PO TABS
18.7500 mg | ORAL_TABLET | Freq: Every day | ORAL | 5 refills | Status: DC
Start: 2022-08-23 — End: 2023-03-08

## 2022-08-23 NOTE — Progress Notes (Signed)
MyChart Video visit  Subjective: YQ:MVHQIO loss PCP: Raliegh Ip, DO NGE:XBMWU Carolyn Cooper is a 67 y.o. female. Patient provides verbal consent for consult held via video.  Due to COVID-19 pandemic this visit was conducted virtually. This visit type was conducted due to national recommendations for restrictions regarding the COVID-19 Pandemic (e.g. social distancing, sheltering in place) in an effort to limit this patient's exposure and mitigate transmission in our community. All issues noted in this document were discussed and addressed.  A physical exam was not performed with this format.   Location of patient: home Location of provider: [facility] Others present for call:  none  1.  Overweight associated with hyperlipidemia Last LDL 130.  Her BMI is over 25.  She has successfully gotten several pounds off with diet and exercise alone but unfortunately cannot get rid of the last 5 pounds.  She has previously been on phentermine many years ago and would like to try going back on that as it was very successful.  She did get [place] with it but never had problems with her blood pressure with it   ROS: Per HPI  Allergies  Allergen Reactions   Sulfa Antibiotics Hives   Past Medical History:  Diagnosis Date   Allergy    seasonal   Anxiety    Barrett's esophagus    Breast cancer (HCC) 01/2020   left breast ILC   Cataract    had surgery   Depression    Family history of breast cancer    Family history of kidney cancer    Family history of ovarian cancer    Family history of stomach cancer    GERD (gastroesophageal reflux disease)    IBS (irritable bowel syndrome)    Migraines    Osteopenia 2019   T score -1.5 FRAX 8% / 0.7%   Personal history of radiation therapy     Current Outpatient Medications:    anastrozole (ARIMIDEX) 1 MG tablet, Take 1 tablet (1 mg total) by mouth daily., Disp: 90 tablet, Rfl: 4   Calcium Citrate-Vitamin D (CALCIUM + D PO), Take by mouth.,  Disp: , Rfl:    escitalopram (LEXAPRO) 20 MG tablet, Take 1 tablet (20 mg total) by mouth daily., Disp: 90 tablet, Rfl: 3   Galcanezumab-gnlm (EMGALITY) 120 MG/ML SOAJ, Inject 120 mg into the skin every 30 (thirty) days., Disp: 3 mL, Rfl: 11   omeprazole (PRILOSEC) 40 MG capsule, Take 1 capsule (40 mg total) by mouth daily., Disp: 30 capsule, Rfl: 11   SUMAtriptan (IMITREX) 100 MG tablet, May repeat in 2 hours if headache persists or recurs., Disp: 9 tablet, Rfl: 12  Vitals:   08/23/22 1227  Height: 5\' 5"  (1.651 m)  Weight: 154 lb (69.9 kg)  BMI (Calculated): 25.63   Gen: Well-appearing female no acute distress  Assessment/ Plan: 67 y.o. female   Weight loss counseling, encounter for - Plan: phentermine (ADIPEX-P) 37.5 MG tablet  Overweight (BMI 25.0-29.9) - Plan: phentermine (ADIPEX-P) 37.5 MG tablet  Pure hypercholesterolemia - Plan: phentermine (ADIPEX-P) 37.5 MG tablet  We discussed using 1/2-1 full tablet of phentermine.  Discussed potential impact on blood pressure so she is to monitor blood pressures closely.  Luckily, she is been normotensive.  We discussed potential side effects.  Encourage p.o. hydration.  May follow-up in 3 to 6 months, sooner if concerns arise.  Plan for UDS and CSC if ongoing need of this medication.  National narcotic database reviewed and there were no red flags  Start time: 12:23pm End time: 12:29p  Total time spent on patient care (including video visit/ documentation): 6 minutes  Carolyn Carolyn Hulen Skains, DO [facility] [phone]

## 2022-08-24 DIAGNOSIS — M1712 Unilateral primary osteoarthritis, left knee: Secondary | ICD-10-CM | POA: Diagnosis not present

## 2022-08-25 ENCOUNTER — Ambulatory Visit: Payer: Medicare HMO | Admitting: Family Medicine

## 2022-08-30 ENCOUNTER — Other Ambulatory Visit: Payer: Self-pay | Admitting: Family Medicine

## 2022-08-30 DIAGNOSIS — F419 Anxiety disorder, unspecified: Secondary | ICD-10-CM

## 2022-08-31 ENCOUNTER — Encounter: Payer: Self-pay | Admitting: Gastroenterology

## 2022-08-31 ENCOUNTER — Ambulatory Visit (INDEPENDENT_AMBULATORY_CARE_PROVIDER_SITE_OTHER): Payer: Medicare HMO

## 2022-08-31 VITALS — Ht 65.0 in | Wt 150.0 lb

## 2022-08-31 DIAGNOSIS — Z0000 Encounter for general adult medical examination without abnormal findings: Secondary | ICD-10-CM | POA: Diagnosis not present

## 2022-08-31 DIAGNOSIS — M1712 Unilateral primary osteoarthritis, left knee: Secondary | ICD-10-CM | POA: Diagnosis not present

## 2022-08-31 NOTE — Progress Notes (Signed)
Subjective:   Carolyn Cooper is a 67 y.o. female who presents for an Initial Medicare Annual Wellness Visit. I connected with  Asialynn Filbrun on 08/31/22 by a audio enabled telemedicine application and verified that I am speaking with the correct person using two identifiers.  Patient Location: Home  Provider Location: Home Office  I discussed the limitations of evaluation and management by telemedicine. The patient expressed understanding and agreed to proceed.  Review of Systems     Cardiac Risk Factors include: advanced age (>34men, >26 women)     Objective:    Today's Vitals   08/31/22 0916  Weight: 150 lb (68 kg)  Height: 5\' 5"  (1.651 m)   Body mass index is 24.96 kg/m.     08/31/2022    9:19 AM 02/14/2020   11:54 AM 02/11/2020    3:36 PM 02/06/2020   10:50 AM 07/09/2019    2:27 AM  Advanced Directives  Does Patient Have a Medical Advance Directive? Yes No Yes Yes No  Type of Estate agent of [place];Living will  Living will Living will   Does patient want to make changes to medical advance directive?  No - Guardian declined No - Patient declined No - Patient declined   Copy of Healthcare Power of Attorney in Chart? No - copy requested      Would patient like information on creating a medical advance directive?  No - Guardian declined       Current Medications (verified) Outpatient Encounter Medications as of 08/31/2022  Medication Sig   anastrozole (ARIMIDEX) 1 MG tablet Take 1 tablet (1 mg total) by mouth daily.   Calcium Citrate-Vitamin D (CALCIUM + D PO) Take by mouth.   escitalopram (LEXAPRO) 20 MG tablet Take 1 tablet (20 mg total) by mouth daily.   Galcanezumab-gnlm (EMGALITY) 120 MG/ML SOAJ Inject 120 mg into the skin every 30 (thirty) days.   omeprazole (PRILOSEC) 40 MG capsule Take 1 capsule (40 mg total) by mouth daily.   phentermine (ADIPEX-P) 37.5 MG tablet Take 0.5-1 tablets (18.75-37.5 mg total) by mouth daily before  breakfast.   SUMAtriptan (IMITREX) 100 MG tablet May repeat in 2 hours if headache persists or recurs.   No facility-administered encounter medications on file as of 08/31/2022.    Allergies (verified) Sulfa antibiotics   History: Past Medical History:  Diagnosis Date   Allergy    seasonal   Anxiety    Barrett's esophagus    Breast cancer (HCC) 01/2020   left breast ILC   Cataract    had surgery   Depression    Family history of breast cancer    Family history of kidney cancer    Family history of ovarian cancer    Family history of stomach cancer    GERD (gastroesophageal reflux disease)    IBS (irritable bowel syndrome)    Migraines    Osteopenia 2019   T score -1.5 FRAX 8% / 0.7%   Personal history of radiation therapy    Past Surgical History:  Procedure Laterality Date   BREAST LUMPECTOMY     BREAST LUMPECTOMY WITH RADIOACTIVE SEED AND SENTINEL LYMPH NODE BIOPSY Left 02/14/2020   Procedure: LEFT BREAST LUMPECTOMY WITH RADIOACTIVE SEED AND SENTINEL LYMPH NODE MAPPING;  Surgeon: Harriette Bouillon, MD;  Location: [facility];  Service: General;  Laterality: Left;  PECTORAL BLOCK   CATARACT EXTRACTION, BILATERAL  2018   CERVICAL CONE BIOPSY  1996   COLONOSCOPY  07/21/2007  CYSTOSCOPY     MOUTH SURGERY  1978   WISDOM TEETH   Family History  Problem Relation Age of Onset   Ovarian cancer Maternal Grandmother 46   Diverticulitis Mother    Heart disease Father    Kidney cancer Father 91   Stomach cancer Maternal Grandfather        dx 40s   Breast cancer Other        dx >50, maternal great-aunt   Breast cancer Other        dx >50, maternal great-aunt   Breast cancer Other        dx >50, maternal great-aunt   Colon cancer Neg Hx    Esophageal cancer Neg Hx    Rectal cancer Neg Hx    Social History   Socioeconomic History   Marital status: Married    Spouse name: Not on file   Number of children: Not on file   Years of education: Not on file    Highest education level: Associate degree: occupational, Scientist, product/process development, or vocational program  Occupational History   Not on file  Tobacco Use   Smoking status: Never   Smokeless tobacco: Never  Vaping Use   Vaping Use: Never used  Substance and Sexual Activity   Alcohol use: Not Currently    Alcohol/week: 0.0 standard drinks of alcohol   Drug use: No   Sexual activity: Not Currently    Partners: Male    Birth control/protection: Post-menopausal    Comment: 1st intercourse 67 yo-Fewer than 5 partners (husband diagnosed with multiple myeloma)  Other Topics Concern   Not on file  Social History Narrative   Not on file   Social Determinants of Health   Financial Resource Strain: Low Risk  (08/31/2022)   Overall Financial Resource Strain (CARDIA)    Difficulty of Paying Living Expenses: Not hard at all  Food Insecurity: No Food Insecurity (08/31/2022)   Hunger Vital Sign    Worried About Running Out of Food in the Last Year: Never true    Ran Out of Food in the Last Year: Never true  Transportation Needs: No Transportation Needs (07/09/2022)   PRAPARE - Administrator, Civil Service (Medical): No    Lack of Transportation (Non-Medical): No  Physical Activity: Inactive (08/31/2022)   Exercise Vital Sign    Days of Exercise per Week: 0 days    Minutes of Exercise per Session: 0 min  Stress: No Stress Concern Present (08/31/2022)   [employer] of Occupational Health - Occupational Stress Questionnaire    Feeling of Stress : Not at all  Social Connections: Socially Integrated (08/31/2022)   Social Connection and Isolation Panel [NHANES]    Frequency of Communication with Friends and Family: More than three times a week    Frequency of Social Gatherings with Friends and Family: More than three times a week    Attends Religious Services: More than 4 times per year    Active Member of [employer] or Organizations: Yes    Attends Engineer, structural: More than 4 times  per year    Marital Status: Married    Tobacco Counseling Counseling given: Not Answered   Clinical Intake:  Pre-visit preparation completed: Yes  Pain : No/denies pain     Nutritional Risks: None Diabetes: No  How often do you need to have someone help you when you read instructions, pamphlets, or other written materials from your doctor or pharmacy?: 1 - Never  Diabetic?no  Interpreter Needed?: No  Information entered by :: Renie Ora, LPN   Activities of Daily Living    08/31/2022    9:19 AM  In your present state of health, do you have any difficulty performing the following activities:  Hearing? 0  Vision? 0  Difficulty concentrating or making decisions? 0  Walking or climbing stairs? 0  Dressing or bathing? 0  Doing errands, shopping? 0  Preparing Food and eating ? N  Using the Toilet? N  In the past six months, have you accidently leaked urine? N  Do you have problems with loss of bowel control? N  Managing your Medications? N  Managing your Finances? N  Housekeeping or managing your Housekeeping? N    Patient Care Team: Raliegh Ip, DO as PCP - General (Family Medicine) Pershing Proud, RN as Oncology Nurse Navigator Donnelly Angelica, RN as Oncology Nurse Navigator Dorothy Puffer, MD as Consulting Physician (Radiation Oncology) Harriette Bouillon, MD as Consulting Physician (General Surgery) Olivia Mackie, NP as Nurse Practitioner (Gynecology) Napoleon Form, MD as Consulting Physician (Gastroenterology) Janalyn Harder, MD (Inactive) as Consulting Physician (Dermatology) Rachel Moulds, MD as Consulting Physician (Hematology and Oncology)  Indicate any recent Medical Services you may have received from other than [facility] providers in the past year (date may be approximate).     Assessment:   This is a routine wellness examination for Natoshia.  Hearing/Vision screen Vision Screening - Comments:: Wears rx glasses - up to date with  routine eye exams with  Dr.Johnson   Dietary issues and exercise activities discussed: Current Exercise Habits: The patient does not participate in regular exercise at present, Exercise limited by: None identified   Goals Addressed             This Visit's Progress    Exercise 3x per week (30 min per time)         Depression Screen    08/31/2022    9:18 AM 07/13/2022   10:01 AM 01/19/2022    9:39 AM 07/27/2021    8:16 AM 03/18/2021   11:06 AM 08/27/2020    3:18 PM 07/07/2020   11:19 AM  PHQ 2/9 Scores  PHQ - 2 Score 0 0 0 0 0 0 0  PHQ- 9 Score 0 0         Fall Risk    08/31/2022    9:17 AM 01/19/2022    9:39 AM 07/27/2021    8:16 AM 03/18/2021   11:06 AM 07/07/2020   11:19 AM  Fall Risk   Falls in the past year? 0 0 1 0 0  Number falls in past yr: 0  0    Injury with Fall? 0  0    Risk for fall due to : No Fall Risks  History of fall(s)    Follow up Falls prevention discussed  Education provided      FALL RISK PREVENTION PERTAINING TO THE HOME:  Any stairs in or around the home? Yes  If so, are there any without handrails? No  Home free of loose throw rugs in walkways, pet beds, electrical cords, etc? No  Adequate lighting in your home to reduce risk of falls? No   ASSISTIVE DEVICES UTILIZED TO PREVENT FALLS:  Life alert? No  Use of a cane, walker or w/c? No  Grab bars in the bathroom? Yes  Shower chair or bench in shower? No  Elevated toilet seat or a handicapped toilet? No  08/31/2022    9:20 AM  6CIT Screen  What Year? 0 points  What month? 0 points  What time? 0 points  Count back from 20 0 points  Months in reverse 0 points  Repeat phrase 0 points  Total Score 0 points    Immunizations Immunization History  Administered Date(s) Administered   Fluad Quad(high Dose 65+) 01/04/2022   Influenza Split 01/16/2013   Influenza,inj,Quad PF,6+ Mos 01/14/2014, 01/14/2015, 01/05/2016, 01/03/2017, 12/30/2017, 12/28/2018, 12/29/2018, 01/24/2020    Influenza-Unspecified 01/14/2014, 01/14/2015, 01/05/2016, 01/13/2021, 01/26/2021   MMR 08/13/2009   Moderna Sars-Covid-2 Vaccination 04/06/2019, 05/11/2019   PNEUMOCOCCAL CONJUGATE-20 01/19/2022   Td 08/04/2009   Tdap 08/04/2009, 04/10/2019   Zoster Recombinat (Shingrix) 12/01/2016, 07/08/2017   Zoster, Live 01/29/2015    TDAP status: Up to date  Flu Vaccine status: Up to date  Pneumococcal vaccine status: Up to date  Covid-19 vaccine status: Completed vaccines  Qualifies for Shingles Vaccine? Yes   Zostavax completed Yes   Shingrix Completed?: Yes  Screening Tests Health Maintenance  Topic Date Due   COVID-19 Vaccine (3 - Moderna risk series) 06/08/2019   MAMMOGRAM  08/28/2022   Colonoscopy  10/25/2022   INFLUENZA VACCINE  11/04/2022   Medicare Annual Wellness (AWV)  08/31/2023   DTaP/Tdap/Td (4 - Td or Tdap) 04/09/2029   Pneumonia Vaccine 94+ Years old  Completed   DEXA SCAN  Completed   Hepatitis C Screening  Completed   Zoster Vaccines- Shingrix  Completed   HPV VACCINES  Aged Out    Health Maintenance  Health Maintenance Due  Topic Date Due   COVID-19 Vaccine (3 - Moderna risk series) 06/08/2019   MAMMOGRAM  08/28/2022   Colonoscopy  10/25/2022    Colorectal cancer screening: Type of screening: Colonoscopy. Completed 10/24/2017. Repeat every 5 years  Mammogram status: Ordered scheduled 09/09/2022. Pt provided with contact info and advised to call to schedule appt.   Bone Density status: Completed 04/06/2022. Results reflect: Bone density results: OSTEOPENIA. Repeat every 5 years.  Lung Cancer Screening: (Low Dose CT Chest recommended if Age 52-80 years, 30 pack-year currently smoking OR have quit w/in 15years.) does not qualify.   Lung Cancer Screening Referral: n/a  Additional Screening:  Hepatitis C Screening: does not qualify; Completed 09/12/2015  Vision Screening: Recommended annual ophthalmology exams for early detection of glaucoma and other  disorders of the eye. Is the patient up to date with their annual eye exam?  Yes  Who is the provider or what is the name of the office in which the patient attends annual eye exams? Dr.Johnson  If pt is not established with a provider, would they like to be referred to a provider to establish care? No .   Dental Screening: Recommended annual dental exams for proper oral hygiene  Community Resource Referral / Chronic Care Management: CRR required this visit?  No   CCM required this visit?  No      Plan:     I have personally reviewed and noted the following in the patient's chart:   Medical and social history Use of alcohol, tobacco or illicit drugs  Current medications and supplements including opioid prescriptions. Patient is not currently taking opioid prescriptions. Functional ability and status Nutritional status Physical activity Advanced directives List of other physicians Hospitalizations, surgeries, and ER visits in previous 12 months Vitals Screenings to include cognitive, depression, and falls Referrals and appointments  In addition, I have reviewed and discussed with patient certain preventive protocols, quality metrics, and best  practice recommendations. A written personalized care plan for preventive services as well as general preventive health recommendations were provided to patient.     Lorrene Reid, LPN   1/61/0960   Nurse Notes: Patient to call schedule colonoscopy received letter

## 2022-08-31 NOTE — Patient Instructions (Signed)
Carolyn Cooper , Thank you for taking time to come for your Medicare Wellness Visit. I appreciate your ongoing commitment to your health goals. Please review the following plan we discussed and let me know if I can assist you in the future.   These are the goals we discussed:  Goals      Exercise 3x per week (30 min per time)        This is a list of the screening recommended for you and due dates:  Health Maintenance  Topic Date Due   COVID-19 Vaccine (3 - Moderna risk series) 06/08/2019   Mammogram  08/28/2022   Colon Cancer Screening  10/25/2022   Flu Shot  11/04/2022   Medicare Annual Wellness Visit  08/31/2023   DTaP/Tdap/Td vaccine (4 - Td or Tdap) 04/09/2029   Pneumonia Vaccine  Completed   DEXA scan (bone density measurement)  Completed   Hepatitis C Screening  Completed   Zoster (Shingles) Vaccine  Completed   HPV Vaccine  Aged Out    Advanced directives: Advance directive discussed with you today. I have provided a copy for you to complete at home and have notarized. Once this is complete please bring a copy in to our office so we can scan it into your chart.   Conditions/risks identified: Aim for 30 minutes of exercise or brisk walking, 6-8 glasses of water, and 5 servings of fruits and vegetables each day.   Next appointment: Follow up in one year for your annual wellness visit    Preventive Care 65 Years and Older, Female Preventive care refers to lifestyle choices and visits with your health care provider that can promote health and wellness. What does preventive care include? A yearly physical exam. This is also called an annual well check. Dental exams once or twice a year. Routine eye exams. Ask your health care provider how often you should have your eyes checked. Personal lifestyle choices, including: Daily care of your teeth and gums. Regular physical activity. Eating a healthy diet. Avoiding tobacco and drug use. Limiting alcohol use. Practicing safe  sex. Taking low-dose aspirin every day. Taking vitamin and mineral supplements as recommended by your health care provider. What happens during an annual well check? The services and screenings done by your health care provider during your annual well check will depend on your age, overall health, lifestyle risk factors, and family history of disease. Counseling  Your health care provider may ask you questions about your: Alcohol use. Tobacco use. Drug use. Emotional well-being. Home and relationship well-being. Sexual activity. Eating habits. History of falls. Memory and ability to understand (cognition). Work and work Astronomer. Reproductive health. Screening  You may have the following tests or measurements: Height, weight, and BMI. Blood pressure. Lipid and cholesterol levels. These may be checked every 5 years, or more frequently if you are over 60 years old. Skin check. Lung cancer screening. You may have this screening every year starting at age 22 if you have a 30-pack-year history of smoking and currently smoke or have quit within the past 15 years. Fecal occult blood test (FOBT) of the stool. You may have this test every year starting at age 33. Flexible sigmoidoscopy or colonoscopy. You may have a sigmoidoscopy every 5 years or a colonoscopy every 10 years starting at age 74. Hepatitis C blood test. Hepatitis B blood test. Sexually transmitted disease (STD) testing. Diabetes screening. This is done by checking your blood sugar (glucose) after you have not eaten for a  while (fasting). You may have this done every 1-3 years. Bone density scan. This is done to screen for osteoporosis. You may have this done starting at age 31. Mammogram. This may be done every 1-2 years. Talk to your health care provider about how often you should have regular mammograms. Talk with your health care provider about your test results, treatment options, and if necessary, the need for more  tests. Vaccines  Your health care provider may recommend certain vaccines, such as: Influenza vaccine. This is recommended every year. Tetanus, diphtheria, and acellular pertussis (Tdap, Td) vaccine. You may need a Td booster every 10 years. Zoster vaccine. You may need this after age 37. Pneumococcal 13-valent conjugate (PCV13) vaccine. One dose is recommended after age 71. Pneumococcal polysaccharide (PPSV23) vaccine. One dose is recommended after age 63. Talk to your health care provider about which screenings and vaccines you need and how often you need them. This information is not intended to replace advice given to you by your health care provider. Make sure you discuss any questions you have with your health care provider. Document Released: 04/18/2015 Document Revised: 12/10/2015 Document Reviewed: 01/21/2015 Elsevier Interactive Patient Education  2017 [employer].  Fall Prevention in the Home Falls can cause injuries. They can happen to people of all ages. There are many things you can do to make your home safe and to help prevent falls. What can I do on the outside of my home? Regularly fix the edges of walkways and driveways and fix any cracks. Remove anything that might make you trip as you walk through a door, such as a raised step or threshold. Trim any bushes or trees on the path to your home. Use bright outdoor lighting. Clear any walking paths of anything that might make someone trip, such as rocks or tools. Regularly check to see if handrails are loose or broken. Make sure that both sides of any steps have handrails. Any raised decks and porches should have guardrails on the edges. Have any leaves, snow, or ice cleared regularly. Use sand or salt on walking paths during winter. Clean up any spills in your garage right away. This includes oil or grease spills. What can I do in the bathroom? Use night lights. Install grab bars by the toilet and in the tub and shower.  Do not use towel bars as grab bars. Use non-skid mats or decals in the tub or shower. If you need to sit down in the shower, use a plastic, non-slip stool. Keep the floor dry. Clean up any water that spills on the floor as soon as it happens. Remove soap buildup in the tub or shower regularly. Attach bath mats securely with double-sided non-slip rug tape. Do not have throw rugs and other things on the floor that can make you trip. What can I do in the bedroom? Use night lights. Make sure that you have a light by your bed that is easy to reach. Do not use any sheets or blankets that are too big for your bed. They should not hang down onto the floor. Have a firm chair that has side arms. You can use this for support while you get dressed. Do not have throw rugs and other things on the floor that can make you trip. What can I do in the kitchen? Clean up any spills right away. Avoid walking on wet floors. Keep items that you use a lot in easy-to-reach places. If you need to reach something above you,  use a strong step stool that has a grab bar. Keep electrical cords out of the way. Do not use floor polish or wax that makes floors slippery. If you must use wax, use non-skid floor wax. Do not have throw rugs and other things on the floor that can make you trip. What can I do with my stairs? Do not leave any items on the stairs. Make sure that there are handrails on both sides of the stairs and use them. Fix handrails that are broken or loose. Make sure that handrails are as long as the stairways. Check any carpeting to make sure that it is firmly attached to the stairs. Fix any carpet that is loose or worn. Avoid having throw rugs at the top or bottom of the stairs. If you do have throw rugs, attach them to the floor with carpet tape. Make sure that you have a light switch at the top of the stairs and the bottom of the stairs. If you do not have them, ask someone to add them for you. What else  can I do to help prevent falls? Wear shoes that: Do not have high heels. Have rubber bottoms. Are comfortable and fit you well. Are closed at the toe. Do not wear sandals. If you use a stepladder: Make sure that it is fully opened. Do not climb a closed stepladder. Make sure that both sides of the stepladder are locked into place. Ask someone to hold it for you, if possible. Clearly mark and make sure that you can see: Any grab bars or handrails. First and last steps. Where the edge of each step is. Use tools that help you move around (mobility aids) if they are needed. These include: Canes. Walkers. Scooters. Crutches. Turn on the lights when you go into a dark area. Replace any light bulbs as soon as they burn out. Set up your furniture so you have a clear path. Avoid moving your furniture around. If any of your floors are uneven, fix them. If there are any pets around you, be aware of where they are. Review your medicines with your doctor. Some medicines can make you feel dizzy. This can increase your chance of falling. Ask your doctor what other things that you can do to help prevent falls. This information is not intended to replace advice given to you by your health care provider. Make sure you discuss any questions you have with your health care provider. Document Released: 01/16/2009 Document Revised: 08/28/2015 Document Reviewed: 04/26/2014 Elsevier Interactive Patient Education  2017 [employer].

## 2022-09-06 DIAGNOSIS — M1712 Unilateral primary osteoarthritis, left knee: Secondary | ICD-10-CM | POA: Diagnosis not present

## 2022-09-07 ENCOUNTER — Other Ambulatory Visit: Payer: Self-pay | Admitting: Family Medicine

## 2022-09-07 DIAGNOSIS — G43709 Chronic migraine without aura, not intractable, without status migrainosus: Secondary | ICD-10-CM

## 2022-09-09 DIAGNOSIS — D485 Neoplasm of uncertain behavior of skin: Secondary | ICD-10-CM | POA: Diagnosis not present

## 2022-09-09 DIAGNOSIS — L821 Other seborrheic keratosis: Secondary | ICD-10-CM | POA: Diagnosis not present

## 2022-09-09 DIAGNOSIS — D1801 Hemangioma of skin and subcutaneous tissue: Secondary | ICD-10-CM | POA: Diagnosis not present

## 2022-09-09 DIAGNOSIS — L814 Other melanin hyperpigmentation: Secondary | ICD-10-CM | POA: Diagnosis not present

## 2022-09-09 DIAGNOSIS — L738 Other specified follicular disorders: Secondary | ICD-10-CM | POA: Diagnosis not present

## 2022-09-09 DIAGNOSIS — D225 Melanocytic nevi of trunk: Secondary | ICD-10-CM | POA: Diagnosis not present

## 2022-09-09 DIAGNOSIS — L570 Actinic keratosis: Secondary | ICD-10-CM | POA: Diagnosis not present

## 2022-09-15 ENCOUNTER — Telehealth: Payer: Self-pay | Admitting: Hematology and Oncology

## 2022-09-15 NOTE — Telephone Encounter (Signed)
Spoke with patient confirming upcoming appointment  

## 2022-09-16 ENCOUNTER — Ambulatory Visit
Admission: RE | Admit: 2022-09-16 | Discharge: 2022-09-16 | Disposition: A | Discharge status: Home / Self Care | Payer: Medicare HMO | Source: Ambulatory Visit | Attending: Hematology and Oncology | Admitting: Hematology and Oncology

## 2022-09-16 DIAGNOSIS — Z170 Estrogen receptor positive status [ER+]: Secondary | ICD-10-CM

## 2022-09-16 DIAGNOSIS — Z853 Personal history of malignant neoplasm of breast: Secondary | ICD-10-CM | POA: Diagnosis not present

## 2022-10-04 ENCOUNTER — Other Ambulatory Visit: Payer: Self-pay | Admitting: *Deleted

## 2022-10-04 DIAGNOSIS — C50412 Malignant neoplasm of upper-outer quadrant of left female breast: Secondary | ICD-10-CM

## 2022-10-05 ENCOUNTER — Other Ambulatory Visit: Payer: Self-pay

## 2022-10-05 ENCOUNTER — Encounter: Payer: Self-pay | Admitting: *Deleted

## 2022-10-05 ENCOUNTER — Inpatient Hospital Stay: Payer: Medicare HMO | Attending: Hematology and Oncology

## 2022-10-05 ENCOUNTER — Inpatient Hospital Stay: Payer: Medicare HMO | Admitting: Hematology and Oncology

## 2022-10-05 VITALS — BP 120/65 | HR 81 | Temp 97.9°F | Resp 16 | Wt 156.5 lb

## 2022-10-05 DIAGNOSIS — Z170 Estrogen receptor positive status [ER+]: Secondary | ICD-10-CM | POA: Diagnosis not present

## 2022-10-05 DIAGNOSIS — C50412 Malignant neoplasm of upper-outer quadrant of left female breast: Secondary | ICD-10-CM | POA: Insufficient documentation

## 2022-10-05 DIAGNOSIS — M8580 Other specified disorders of bone density and structure, unspecified site: Secondary | ICD-10-CM | POA: Diagnosis not present

## 2022-10-05 DIAGNOSIS — Z79811 Long term (current) use of aromatase inhibitors: Secondary | ICD-10-CM | POA: Diagnosis not present

## 2022-10-05 DIAGNOSIS — G4730 Sleep apnea, unspecified: Secondary | ICD-10-CM | POA: Diagnosis not present

## 2022-10-05 LAB — CBC WITH DIFFERENTIAL (CANCER CENTER ONLY)
Abs Immature Granulocytes: 0.01 10*3/uL (ref 0.00–0.07)
Basophils Absolute: 0 10*3/uL (ref 0.0–0.1)
Basophils Relative: 1 %
Eosinophils Absolute: 0.1 10*3/uL (ref 0.0–0.5)
Eosinophils Relative: 1 %
HCT: 39.8 % (ref 36.0–46.0)
Hemoglobin: 13.3 g/dL (ref 12.0–15.0)
Immature Granulocytes: 0 %
Lymphocytes Relative: 18 %
Lymphs Abs: 1.1 10*3/uL (ref 0.7–4.0)
MCH: 33.6 pg (ref 26.0–34.0)
MCHC: 33.4 g/dL (ref 30.0–36.0)
MCV: 100.5 fL — ABNORMAL HIGH (ref 80.0–100.0)
Monocytes Absolute: 0.4 10*3/uL (ref 0.1–1.0)
Monocytes Relative: 7 %
Neutro Abs: 4.5 10*3/uL (ref 1.7–7.7)
Neutrophils Relative %: 73 %
Platelet Count: 261 10*3/uL (ref 150–400)
RBC: 3.96 MIL/uL (ref 3.87–5.11)
RDW: 12.2 % (ref 11.5–15.5)
WBC Count: 6.1 10*3/uL (ref 4.0–10.5)
nRBC: 0 % (ref 0.0–0.2)

## 2022-10-05 LAB — CMP (CANCER CENTER ONLY)
ALT: 13 U/L (ref 0–44)
AST: 16 U/L (ref 15–41)
Albumin: 4.1 g/dL (ref 3.5–5.0)
Alkaline Phosphatase: 88 U/L (ref 38–126)
Anion gap: 6 (ref 5–15)
BUN: 13 mg/dL (ref 8–23)
CO2: 30 mmol/L (ref 22–32)
Calcium: 8.9 mg/dL (ref 8.9–10.3)
Chloride: 104 mmol/L (ref 98–111)
Creatinine: 0.79 mg/dL (ref 0.44–1.00)
GFR, Estimated: >60 mL/min (ref >60)
Glucose, Bld: 84 mg/dL (ref 70–99)
Potassium: 3.5 mmol/L (ref 3.5–5.1)
Sodium: 140 mmol/L (ref 135–145)
Total Bilirubin: 0.6 mg/dL (ref 0.3–1.2)
Total Protein: 6.6 g/dL (ref 6.5–8.1)

## 2022-10-05 NOTE — Progress Notes (Signed)
[facility]  [phone] [phone]     ID: Beverli Sundberg DOB: 1955-11-17  MR#: [id number]  JYN#:[id number]  Patient Care Team: Raliegh Ip, DO as PCP - General (Family Medicine) Pershing Proud, RN as Oncology Nurse Navigator Donnelly Angelica, RN as Oncology Nurse Navigator Dorothy Puffer, MD as Consulting Physician (Radiation Oncology) Harriette Bouillon, MD as Consulting Physician (General Surgery) Olivia Mackie, NP as Nurse Practitioner (Gynecology) Napoleon Form, MD as Consulting Physician (Gastroenterology) Janalyn Harder, MD (Inactive) as Consulting Physician (Dermatology) Rachel Moulds, MD as Consulting Physician (Hematology and Oncology) Rachel Moulds, MD OTHER MD:   CHIEF COMPLAINT: Invasive lobular breast cancer  CURRENT TREATMENT: anastrozole  INTERVAL HISTORY: Debbi returns today for follow up of her invasive lobular breast cancer.  She is tolerating anastrozole very well for adjuvant antiestrogen therapy.  No adverse effects reported. She has completed dental treatment, she would like to consider bisphosphonate for osteopenia since she is also on anastrozole.  Today her main complaint is fatigue, otherwise she denies any major issues.  She also had sleep study which showed mild sleep apnea but was not recommended to do any intervention. She has been taking calcium and vitamin D supplementation.  She is not exercising on a regular basis.  Rest of the pertinent 10 point ROS reviewed and negative   COVID 19 VACCINATION STATUS: Status post 2 doses, booster pending as of January 2022   HISTORY OF CURRENT ILLNESS: From the original intake note:  "Debbi" had routine screening mammography on 01/02/2020 showing a possible abnormality in the left breast. She underwent left diagnostic mammography with tomography and left breast ultrasonography at [facility] on 01/15/2020 showing: breast density category C; suspicious  3 mm mass in left breast at 2:30; no left lymphadenopathy.  Accordingly on 01/28/2020 she proceeded to biopsy of the left breast area in question. The pathology from this procedure ([id number]) showed: invasive and in situ mammary carcinoma, e-cadherin negative, grade 2. Prognostic indicators significant for: estrogen receptor, >95% positive with strong staining intensity and progesterone receptor, 20% positive with moderate staining intensity. Proliferation marker Ki67 at 10%. HER2 equivocal by immunohistochemistry (2+), but negative by fluorescent in situ hybridization with a signals ratio 1.52 and number per cell 3.5.  The patient's subsequent history is as detailed below.   PAST MEDICAL HISTORY: Past Medical History:  Diagnosis Date   Allergy    seasonal   Anxiety    Barrett's esophagus    Breast cancer (HCC) 01/2020   left breast ILC   Cataract    had surgery   Depression    Family history of breast cancer    Family history of kidney cancer    Family history of ovarian cancer    Family history of stomach cancer    GERD (gastroesophageal reflux disease)    IBS (irritable bowel syndrome)    Migraines    Osteopenia 2019   T score -1.5 FRAX 8% / 0.7%   Personal history of radiation therapy     PAST SURGICAL HISTORY: Past Surgical History:  Procedure Laterality Date   BREAST LUMPECTOMY     BREAST LUMPECTOMY WITH RADIOACTIVE SEED AND SENTINEL LYMPH NODE BIOPSY Left 02/14/2020   Procedure: LEFT BREAST LUMPECTOMY WITH RADIOACTIVE SEED AND SENTINEL LYMPH NODE MAPPING;  Surgeon: Harriette Bouillon, MD;  Location:  SURGERY CENTER;  Service: General;  Laterality: Left;  PECTORAL BLOCK   CATARACT EXTRACTION, BILATERAL  2018   CERVICAL CONE BIOPSY  1996  COLONOSCOPY  07/21/2007   CYSTOSCOPY     MOUTH SURGERY  1978   WISDOM TEETH    FAMILY HISTORY: Family History  Problem Relation Age of Onset   Ovarian cancer Maternal Grandmother 42   Diverticulitis Mother    Heart  disease Father    Kidney cancer Father 2   Stomach cancer Maternal Grandfather        dx 44s   Breast cancer Other        dx >50, maternal great-aunt   Breast cancer Other        dx >50, maternal great-aunt   Breast cancer Other        dx >50, maternal great-aunt   Colon cancer Neg Hx    Esophageal cancer Neg Hx    Rectal cancer Neg Hx    Her parents are living as of 02/2020-- her father at age 47 and her mother at age 20. Debbi has one sister (and no brothers). She notes her mother had previously undergone genetic testing for BRCA 1 and 2, and she was negative. She reports ovarian cancer in her maternal grandmother at age 55, stomach cancer in her maternal grandfather in his 59's, and kidney cancer in her dad at age 35.   GYNECOLOGIC HISTORY:  No LMP recorded. Patient is postmenopausal. Menarche: 67 years old Age at first live birth: 67 years old GX P 4 LMP 2015? Contraceptive: used "pretty much from 83 on" with no issues HRT used briefly, stopped "about 5 years ago"  Hysterectomy? no BSO? 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 Husband Viviann Spare works for [employer]. Daughter Janne Napoleon, age 68, is a Veterinary surgeon here in [place]. Son Camie Patience, age 89, is unemployed. Daughter Joette Catching, age 106, is a homemaker here in [place]. Son Micheal Likens, age 19, works in Arts development officer in [place]. Debbi has 4 grandchildren. She attends a [employer].    ADVANCED DIRECTIVES: In the absence of any documentation to the contrary, the patient's spouse is their HCPOA.    HEALTH MAINTENANCE: Social History   Tobacco Use   Smoking status: Never   Smokeless tobacco: Never  Vaping Use   Vaping Use: Never used  Substance Use Topics   Alcohol use: Not Currently    Alcohol/week: 0.0 standard drinks of alcohol   Drug use: No     Colonoscopy: 10/2017 (Dr. Lavon Paganini), repeat 2024  PAP:  01/2018, negative  Bone density: 05/2017, -1.5   Allergies  Allergen Reactions   Sulfa Antibiotics Hives    Current Outpatient Medications  Medication Sig Dispense Refill   anastrozole (ARIMIDEX) 1 MG tablet Take 1 tablet (1 mg total) by mouth daily. 90 tablet 4   Calcium Citrate-Vitamin D (CALCIUM + D PO) Take by mouth.     escitalopram (LEXAPRO) 20 MG tablet TAKE 1 TABLET BY MOUTH EVERY DAY 90 tablet 1   Galcanezumab-gnlm (EMGALITY) 120 MG/ML SOAJ Inject 120 mg into the skin every 30 (thirty) days. 3 mL 11   omeprazole (PRILOSEC) 40 MG capsule Take 1 capsule (40 mg total) by mouth daily. 30 capsule 11   phentermine (ADIPEX-P) 37.5 MG tablet Take 0.5-1 tablets (18.75-37.5 mg total) by mouth daily before breakfast. 30 tablet 5   SUMAtriptan (IMITREX) 100 MG tablet May repeat in 2 hours if headache persists or recurs. 9 tablet 12   No current facility-administered medications for this visit.    OBJECTIVE: White woman  in no acute distress  Vitals:   10/05/22 0820  BP: 120/65  Pulse: 81  Resp: 16  Temp: 97.9 F (36.6 C)  SpO2: 99%      Body mass index is 26.04 kg/m.   Wt Readings from Last 3 Encounters:  10/05/22 156 lb 8 oz (71 kg)  08/31/22 150 lb (68 kg)  08/23/22 154 lb (69.9 kg)      ECOG FS:1 - Symptomatic but completely ambulatory  Physical Exam Constitutional:      Appearance: Normal appearance.  Cardiovascular:     Rate and Rhythm: Normal rate and regular rhythm.     Pulses: Normal pulses.     Heart sounds: Normal heart sounds.  Pulmonary:     Effort: Pulmonary effort is normal.     Breath sounds: Normal breath sounds.  Chest:     Comments: Bilateral breasts inspected.  No palpable masses or regional adenopathy Musculoskeletal:        General: No swelling or tenderness.     Cervical back: Normal range of motion. No rigidity.  Lymphadenopathy:     Cervical: No cervical adenopathy.  Skin:    General: Skin is warm and dry.  Neurological:     General: No  focal deficit present.     Mental Status: She is alert.  Psychiatric:        Mood and Affect: Mood normal.     LAB RESULTS:  CMP     Component Value Date/Time   NA 140 10/05/2021 0918   NA 141 07/27/2021 0841   K 4.1 10/05/2021 0918   CL 104 10/05/2021 0918   CO2 31 10/05/2021 0918   GLUCOSE 96 10/05/2021 0918   BUN 11 10/05/2021 0918   BUN 9 07/27/2021 0841   CREATININE 0.79 10/05/2021 0918   CREATININE 0.79 10/11/2012 1527   CALCIUM 9.8 10/05/2021 0918   PROT 7.1 10/05/2021 0918   PROT 6.6 07/27/2021 0841   ALBUMIN 4.4 10/05/2021 0918   ALBUMIN 4.4 07/27/2021 0841   AST 18 10/05/2021 0918   ALT 14 10/05/2021 0918   ALKPHOS 101 10/05/2021 0918   BILITOT 0.6 10/05/2021 0918   GFRNONAA >60 10/05/2021 0918   GFRAA >60 07/09/2019 0308    No results found for: "TOTALPROTELP", "ALBUMINELP", "A1GS", "A2GS", "BETS", "BETA2SER", "GAMS", "MSPIKE", "SPEI"  Lab Results  Component Value Date   WBC 6.1 10/05/2022   NEUTROABS 4.5 10/05/2022   HGB 13.3 10/05/2022   HCT 39.8 10/05/2022   MCV 100.5 (H) 10/05/2022   PLT 261 10/05/2022    No results found for: "LABCA2"  No components found for: "GNFAOZ308"  No results for input(s): "INR" in the last 168 hours.  No results found for: "LABCA2"  No results found for: "MVH846"  No results found for: "CAN125"  No results found for: "CAN153"  No results found for: "CA2729"  No components found for: "HGQUANT"  No results found for: "CEA1", "CEA" / No results found for: "CEA1", "CEA"   No results found for: "AFPTUMOR"  No results found for: "CHROMOGRNA"  No results found for: "KPAFRELGTCHN", "LAMBDASER", "KAPLAMBRATIO" (kappa/lambda light chains)  No results found for: "HGBA", "HGBA2QUANT", "HGBFQUANT", "HGBSQUAN" (Hemoglobinopathy evaluation)   No results found for: "LDH"  Lab Results  Component Value Date   IRON 109 07/13/2022   TIBC 227 (L) 07/13/2022   IRONPCTSAT 48 07/13/2022   (Iron and TIBC)  Lab  Results  Component Value Date   FERRITIN 159 (H) 07/13/2022    Urinalysis  Component Value Date/Time   COLORURINE YELLOW 12/19/2015 1217   APPEARANCEUR Clear 10/18/2016 0859   LABSPEC 1.001 12/19/2015 1217   PHURINE 6.0 12/19/2015 1217   GLUCOSEU Negative 10/18/2016 0859   HGBUR NEGATIVE 12/19/2015 1217   BILIRUBINUR Negative 10/18/2016 0859   KETONESUR NEGATIVE 12/19/2015 1217   PROTEINUR Negative 10/18/2016 0859   PROTEINUR NEGATIVE 12/19/2015 1217   UROBILINOGEN 0.2 10/12/2013 1545   NITRITE Negative 10/18/2016 0859   NITRITE NEGATIVE 12/19/2015 1217   LEUKOCYTESUR 1+ (A) 10/18/2016 0859    STUDIES: MM 3D DIAGNOSTIC MAMMOGRAM BILATERAL BREAST  Result Date: 09/16/2022 CLINICAL DATA:  Status post left lumpectomy in 2021 with adjuvant radiation therapy. Currently on anastrozole. EXAM: DIGITAL DIAGNOSTIC BILATERAL MAMMOGRAM WITH TOMOSYNTHESIS TECHNIQUE: Bilateral digital diagnostic mammography and breast tomosynthesis was performed. COMPARISON:  Previous exam(s). ACR Breast Density Category c: The breasts are heterogeneously dense, which may obscure small masses. FINDINGS: Stable postlumpectomy changes in the upper-outer left breast. No new suspicious mass, calcifications, or other findings are identified in bilateral breasts. IMPRESSION: No evidence of malignancy. RECOMMENDATION: Per protocol, as the patient is now 2 or more years status post lumpectomy, she may return to annual screening mammography in 1 year. However, given the history of breast cancer, the patient remains eligible for annual diagnostic mammography if preferred. I have discussed the findings and recommendations with the patient. If applicable, a reminder letter will be sent to the patient regarding the next appointment. BI-RADS CATEGORY  2: Benign. Electronically Signed   By: Jacob Moores M.D.   On: 09/16/2022 11:19   ELIGIBLE FOR AVAILABLE RESEARCH PROTOCOL: AET  ASSESSMENT: 67 y.o. [place] woman status post  left breast upper outer quadrant biopsy 01/28/2020 for a clinical T1a/b N0, stage IA invasive lobular carcinoma, E-cadherin negative, estrogen and progesterone receptor positive, HER-2 not amplified, with an MIB-1 of 10%.  (1) status post left lumpectomy and sentinel lymph node sampling 02/14/2020 for a pT1c pN0, stage IA invasive lobular carcinoma, grade 2, with negative margins  (a) 2 left axillary lymph nodes were removed  (2) Oncotype score of 16 predicts a risk of recurrence outside the breast in the next 9 years of 4% if the patient's only systemic therapy is antiestrogens for 5 years.  It also predicts no benefit from chemotherapy  (3) adjuvant radiation: Radiation Treatment Dates: 04/08/2020 through 05/05/2020 Site Technique Total Dose (Gy) Dose per Fx (Gy) Completed Fx Beam Energies  Breast, Left: Breast_Lt 3D 42.56/42.56 2.66 16/16 6X, 10X  Breast, Left: Breast_Lt_Bst 3D 8/8 2 4/4 6X, 10X   (4) started anastrozole 05/19/2020  (a) bone density 07/07/2020 shows a T score of -1.4 (minimal osteopenia) mid June 2023, labs same day   PLAN:  Patient is here for follow-up.  She is taking anastrozole as recommended.  Last bone density with osteopenia, she finished her dental extractions and root canal treatments.  Will try to fax a dental clearance to Dr. Maryclare Labrador in [facility].  She is interested in pursuing bisphosphonates but she also wants to talk to her PCP if she can get the once daily dosing. No concerns on breast exam.  Most recent mammogram with no evidence of malignancy.  I encouraged her to exercise at least 30 minutes of walking for 5 days a week.  This will likely improve her fatigue as well. RTC in 1 yr for follow up.  Total time spent: 30 min  *Total Encounter Time as defined by the Centers for Medicare and Medicaid Services includes, in addition  to the face-to-face time of a patient visit (documented in the note above) non-face-to-face time: obtaining and reviewing  outside history, ordering and reviewing medications, tests or procedures, care coordination (communications with other health care professionals or caregivers) and documentation in the medical record.

## 2022-10-06 ENCOUNTER — Encounter: Payer: Self-pay | Admitting: Family Medicine

## 2022-10-08 ENCOUNTER — Other Ambulatory Visit: Payer: Medicare HMO

## 2022-10-08 ENCOUNTER — Ambulatory Visit: Payer: Medicare HMO | Admitting: Hematology and Oncology

## 2022-10-11 ENCOUNTER — Ambulatory Visit: Payer: Medicare HMO | Admitting: Pharmacist

## 2022-10-11 ENCOUNTER — Encounter: Payer: Self-pay | Admitting: Hematology and Oncology

## 2022-10-11 NOTE — Progress Notes (Signed)
[facility]       Telephone: [phone]?Fax: [phone]   Oncology Clinical Pharmacist Practitioner Progress Note  Carolyn Cooper is a 67 y.o. female with a diagnosis of breast cancer currently on anastrozole under the care of Dr. Rachel Moulds.   Carolyn Cooper contacted Dr. Remonia Richter clinic asking about possible rash with anastrozole. Clinical pharmacy contacted her back to discuss. She saw Dr. Al Pimple last week and states she did not mention it at that time but has noticed a rash on her hands that comes and goes for some time now. She states it does not appear infected but does itch at times. She does report gardening without gloves which may be causing symptoms. She does use lotions and we did recommend covering her hands when gardening and could consider a OTC hydrocortisone cream. She said she will be seeing her PCP soon as well. We explained that she could always come back in to see Dr. Al Pimple for an exam but low suspicion this would be from anastrozole since been on anastrozole since around 04/16/20. She does not report starting any new medications. We explained to monitor for signs of infection and worsening symptoms.  Carolyn Cooper participated in the discussion, expressed understanding, and voiced agreement with the above plan. All questions were answered to her satisfaction. The patient was advised to contact the clinic at [phone] with any questions or concerns prior to her return visit.  Clinical pharmacy will continue to support Carolyn Cooper and Dr. Burnice Logan Iruku as needed.  Anselm Lis, RPH-CPP,  10/11/2022  4:48 PM   **Disclaimer: This note was dictated with voice recognition software. Similar sounding words can inadvertently be transcribed and this note may contain transcription errors which may not have been corrected upon publication of note.**

## 2022-11-01 DIAGNOSIS — H5203 Hypermetropia, bilateral: Secondary | ICD-10-CM | POA: Diagnosis not present

## 2022-11-01 DIAGNOSIS — H52223 Regular astigmatism, bilateral: Secondary | ICD-10-CM | POA: Diagnosis not present

## 2022-11-01 DIAGNOSIS — H524 Presbyopia: Secondary | ICD-10-CM | POA: Diagnosis not present

## 2022-11-02 ENCOUNTER — Ambulatory Visit (AMBULATORY_SURGERY_CENTER): Payer: Medicare HMO

## 2022-11-02 ENCOUNTER — Encounter: Payer: Self-pay | Admitting: Gastroenterology

## 2022-11-02 VITALS — Ht 65.0 in | Wt 152.0 lb

## 2022-11-02 DIAGNOSIS — Z86010 Personal history of colonic polyps: Secondary | ICD-10-CM

## 2022-11-02 MED ORDER — PEG 3350-KCL-NA BICARB-NACL 420 G PO SOLR
4000.0000 mL | Freq: Once | ORAL | 0 refills | Status: AC
Start: 2022-11-02 — End: 2022-11-02

## 2022-11-02 NOTE — Progress Notes (Signed)
No egg or soy allergy known to patient  No issues known to pt with past sedation with any surgeries or procedures Patient denies ever being told they had issues or difficulty with intubation  No FH of Malignant Hyperthermia Pt is not on diet pills Pt is not on  home 02  Pt is not on blood thinners  Pt denies issues with constipation  No A fib or A flutter Have any cardiac testing pending-- no  LOA: independent  Prep: Golytely   Patient's chart reviewed by Carolyn Cooper CNRA prior to previsit and patient appropriate for the [facility].  Previsit completed and red dot placed by patient's name on their procedure day (on provider's schedule).     PV competed with patient. Prep instructions sent via mychart and home address.  

## 2022-11-23 ENCOUNTER — Encounter: Payer: Self-pay | Admitting: Gastroenterology

## 2022-11-23 ENCOUNTER — Ambulatory Visit (AMBULATORY_SURGERY_CENTER): Payer: Medicare HMO | Admitting: Gastroenterology

## 2022-11-23 VITALS — BP 111/51 | HR 64 | Temp 98.4°F | Resp 14 | Ht 65.0 in | Wt 152.0 lb

## 2022-11-23 DIAGNOSIS — K580 Irritable bowel syndrome with diarrhea: Secondary | ICD-10-CM | POA: Diagnosis not present

## 2022-11-23 DIAGNOSIS — F32A Depression, unspecified: Secondary | ICD-10-CM | POA: Diagnosis not present

## 2022-11-23 DIAGNOSIS — Z09 Encounter for follow-up examination after completed treatment for conditions other than malignant neoplasm: Secondary | ICD-10-CM

## 2022-11-23 DIAGNOSIS — Z86010 Personal history of colonic polyps: Secondary | ICD-10-CM

## 2022-11-23 DIAGNOSIS — F419 Anxiety disorder, unspecified: Secondary | ICD-10-CM | POA: Diagnosis not present

## 2022-11-23 MED ORDER — HYDROCORT-PRAMOXINE (PERIANAL) 2.5-1 % EX CREA: 1.0000 | TOPICAL_CREAM | Freq: Two times a day (BID) | CUTANEOUS | 0 refills | Status: DC

## 2022-11-23 MED ORDER — SODIUM CHLORIDE 0.9 % IV SOLN: 500.0000 mL | Freq: Once | INTRAVENOUS | Status: DC

## 2022-11-23 NOTE — Op Note (Signed)
[facility] Patient Name: Carolyn Cooper Procedure Date: 11/23/2022 8:27 AM MRN: [id number] Endoscopist: Napoleon Form , MD, [id number] Age: 67 Referring MD:  Date of Birth: 18-Nov-1955 Gender: Female Account #: [id number] Procedure:                Colonoscopy Indications:              High risk colon cancer surveillance: Personal                            history of colonic polyps, High risk colon cancer                            surveillance: Personal history of adenoma (10 mm or                            greater in size) Medicines:                Monitored Anesthesia Care Procedure:                Pre-Anesthesia Assessment:                           - Prior to the procedure, a History and Physical                            was performed, and patient medications and                            allergies were reviewed. The patient's tolerance of                            previous anesthesia was also reviewed. The risks                            and benefits of the procedure and the sedation                            options and risks were discussed with the patient.                            All questions were answered, and informed consent                            was obtained. Prior Anticoagulants: The patient has                            taken no anticoagulant or antiplatelet agents. ASA                            Grade Assessment: II - A patient with mild systemic                            disease. After reviewing the risks and benefits,  the patient was deemed in satisfactory condition to                            undergo the procedure.                           After obtaining informed consent, the colonoscope                            was passed under direct vision. Throughout the                            procedure, the patient's blood pressure, pulse, and                            oxygen saturations were monitored  continuously. The                            PCF-HQ190L Colonoscope [id number] was introduced                            through the anus and advanced to the the cecum,                            identified by appendiceal orifice and ileocecal                            valve. The colonoscopy was performed without                            difficulty. The patient tolerated the procedure                            well. The quality of the bowel preparation was                            adequate to identify polyps greater than 5 mm in                            size. The ileocecal valve, appendiceal orifice, and                            rectum were photographed. Scope In: 8:44:09 AM Scope Out: 9:05:20 AM Scope Withdrawal Time: 0 hours 13 minutes 39 seconds  Total Procedure Duration: 0 hours 21 minutes 11 seconds  Findings:                 Hemorrhoids were found on perianal exam.                           Scattered small-mouthed diverticula were found in                            the sigmoid colon and descending colon.  Non-bleeding prolapsed external and internal                            hemorrhoids were found during retroflexion. The                            hemorrhoids were medium-sized.                           The exam was otherwise without abnormality. Complications:            No immediate complications. Estimated Blood Loss:     Estimated blood loss was minimal. Impression:               - Hemorrhoids found on perianal exam.                           - Diverticulosis in the sigmoid colon and in the                            descending colon.                           - Non-bleeding prolapsed external and internal                            hemorrhoids.                           - The examination was otherwise normal.                           - No specimens collected. Recommendation:           - Patient has a contact number available for                             emergencies. The signs and symptoms of potential                            delayed complications were discussed with the                            patient. Return to normal activities tomorrow.                            Written discharge instructions were provided to the                            patient.                           - Resume previous diet.                           - Continue present medications.                           -  Repeat colonoscopy in 5 years for surveillance.                           - Use Benefiber one teaspoon PO TID.                           - Use Analpram HC Cream 2.5%: Apply externally BID                            for 5 days. Napoleon Form, MD 11/23/2022 9:18:01 AM This report has been signed electronically.

## 2022-11-23 NOTE — Progress Notes (Unsigned)
Patient coughed during procedure with clear secretions suctioned from oropharynx. Nothing gastric noted. Zofran given. Otherwise, uneventful anesthetic. Report to pacu rn. Vss. Care resumed by rn.

## 2022-11-23 NOTE — Progress Notes (Unsigned)
[facility] History and Physical   Primary Care Physician:  Raliegh Ip, DO   Reason for Procedure:  History of adenomatous colon polyps  Plan:    Surveillance colonoscopy with possible interventions as needed     HPI: Carolyn Cooper is a very pleasant 67 y.o. female here for surveillance colonoscopy. Denies any nausea, vomiting, abdominal pain, melena or bright red blood per rectum  The risks and benefits as well as alternatives of endoscopic procedure(s) have been discussed and reviewed. All questions answered. The patient agrees to proceed.    Past Medical History:  Diagnosis Date   Allergy    seasonal   Anxiety    Barrett's esophagus    Breast cancer (HCC) 01/2020   left breast ILC   Cataract    had surgery   Depression    Family history of breast cancer    Family history of kidney cancer    Family history of ovarian cancer    Family history of stomach cancer    GERD (gastroesophageal reflux disease)    IBS (irritable bowel syndrome)    Migraines    Osteopenia 2019   T score -1.5 FRAX 8% / 0.7%   Personal history of radiation therapy     Past Surgical History:  Procedure Laterality Date   BREAST LUMPECTOMY     BREAST LUMPECTOMY WITH RADIOACTIVE SEED AND SENTINEL LYMPH NODE BIOPSY Left 02/14/2020   Procedure: LEFT BREAST LUMPECTOMY WITH RADIOACTIVE SEED AND SENTINEL LYMPH NODE MAPPING;  Surgeon: Harriette Bouillon, MD;  Location: [facility];  Service: General;  Laterality: Left;  PECTORAL BLOCK   CATARACT EXTRACTION, BILATERAL  2018   CERVICAL CONE BIOPSY  1996   COLONOSCOPY  07/21/2007   CYSTOSCOPY     MOUTH SURGERY  1978   WISDOM TEETH    Prior to Admission medications   Medication Sig Start Date End Date Taking? Authorizing Provider  anastrozole (ARIMIDEX) 1 MG tablet Take 1 tablet (1 mg total) by mouth daily. 08/12/22  Yes Rachel Moulds, MD  Calcium Citrate-Vitamin D (CALCIUM + D PO) Take by mouth daily. 1200 -  1000 mg tablet   Yes [provider]  escitalopram (LEXAPRO) 20 MG tablet TAKE 1 TABLET BY MOUTH EVERY DAY 08/31/22  Yes Gottschalk, Ashly M, DO  omeprazole (PRILOSEC) 40 MG capsule Take 1 capsule (40 mg total) by mouth daily. 12/30/21  Yes Unk Lightning, PA  SUMAtriptan (IMITREX) 100 MG tablet May repeat in 2 hours if headache persists or recurs. 08/11/22  Yes Gottschalk, Ashly M, DO  Galcanezumab-gnlm (EMGALITY) 120 MG/ML SOAJ Inject 120 mg into the skin every 30 (thirty) days. 06/21/22   Raliegh Ip, DO  phentermine (ADIPEX-P) 37.5 MG tablet Take 0.5-1 tablets (18.75-37.5 mg total) by mouth daily before breakfast. 08/23/22   Raliegh Ip, DO    Current Outpatient Medications  Medication Sig Dispense Refill   anastrozole (ARIMIDEX) 1 MG tablet Take 1 tablet (1 mg total) by mouth daily. 90 tablet 4   Calcium Citrate-Vitamin D (CALCIUM + D PO) Take by mouth daily. 1200 - 1000 mg tablet     escitalopram (LEXAPRO) 20 MG tablet TAKE 1 TABLET BY MOUTH EVERY DAY 90 tablet 1   omeprazole (PRILOSEC) 40 MG capsule Take 1 capsule (40 mg total) by mouth daily. 30 capsule 11   SUMAtriptan (IMITREX) 100 MG tablet May repeat in 2 hours if headache persists or recurs. 9 tablet 12   Galcanezumab-gnlm (EMGALITY) 120 MG/ML SOAJ  Inject 120 mg into the skin every 30 (thirty) days. 3 mL 11   phentermine (ADIPEX-P) 37.5 MG tablet Take 0.5-1 tablets (18.75-37.5 mg total) by mouth daily before breakfast. 30 tablet 5   Current Facility-Administered Medications  Medication Dose Route Frequency Provider Last Rate Last Admin   0.9 %  sodium chloride infusion  500 mL Intravenous Once Napoleon Form, MD        Allergies as of 11/23/2022 - Review Complete 11/23/2022  Allergen Reaction Noted   Sulfa antibiotics Hives 09/29/2010    Family History  Problem Relation Age of Onset   Ovarian cancer Maternal Grandmother 65   Diverticulitis Mother    Heart disease Father    Kidney cancer  Father 20   Stomach cancer Maternal Grandfather        dx 43s   Breast cancer Other        dx >50, maternal great-aunt   Breast cancer Other        dx >50, maternal great-aunt   Breast cancer Other        dx >50, maternal great-aunt   Colon cancer Neg Hx    Esophageal cancer Neg Hx    Rectal cancer Neg Hx     Social History   Socioeconomic History   Marital status: Married    Spouse name: Not on file   Number of children: Not on file   Years of education: Not on file   Highest education level: Associate degree: occupational, Scientist, product/process development, or vocational program  Occupational History   Not on file  Tobacco Use   Smoking status: Never   Smokeless tobacco: Never  Vaping Use   Vaping status: Never Used  Substance and Sexual Activity   Alcohol use: Not Currently    Alcohol/week: 0.0 standard drinks of alcohol   Drug use: No   Sexual activity: Not Currently    Partners: Male    Birth control/protection: Post-menopausal    Comment: 1st intercourse 67 yo-Fewer than 5 partners (husband diagnosed with multiple myeloma)  Other Topics Concern   Not on file  Social History Narrative   Not on file   Social Determinants of Health   Financial Resource Strain: Low Risk  (08/31/2022)   Overall Financial Resource Strain (CARDIA)    Difficulty of Paying Living Expenses: Not hard at all  Food Insecurity: No Food Insecurity (08/31/2022)   Hunger Vital Sign    Worried About Running Out of Food in the Last Year: Never true    Ran Out of Food in the Last Year: Never true  Transportation Needs: No Transportation Needs (07/09/2022)   PRAPARE - Administrator, Civil Service (Medical): No    Lack of Transportation (Non-Medical): No  Physical Activity: Inactive (08/31/2022)   Exercise Vital Sign    Days of Exercise per Week: 0 days    Minutes of Exercise per Session: 0 min  Stress: No Stress Concern Present (08/31/2022)   [employer] of Occupational Health - Occupational Stress  Questionnaire    Feeling of Stress : Not at all  Social Connections: Socially Integrated (08/31/2022)   Social Connection and Isolation Panel [NHANES]    Frequency of Communication with Friends and Family: More than three times a week    Frequency of Social Gatherings with Friends and Family: More than three times a week    Attends Religious Services: More than 4 times per year    Active Member of [employer] or Organizations: Yes  Attends Banker Meetings: More than 4 times per year    Marital Status: Married  Catering manager Violence: Not At Risk (08/31/2022)   Humiliation, Afraid, Rape, and Kick questionnaire    Fear of Current or Ex-Partner: No    Emotionally Abused: No    Physically Abused: No    Sexually Abused: No    Review of Systems:  All other review of systems negative except as mentioned in the HPI.  Physical Exam: Vital signs in last 24 hours: BP 108/71   Pulse 65   Temp 98.4 F (36.9 C) (Skin)   Ht 5\' 5"  (1.651 m)   Wt 152 lb (68.9 kg)   SpO2 96%   BMI 25.29 kg/m  General:   Alert, NAD Lungs:  Clear .   Heart:  Regular rate and rhythm Abdomen:  Soft, nontender and nondistended. Neuro/Psych:  Alert and cooperative. Normal mood and affect. A and O x 3  Reviewed labs, radiology imaging, old records and pertinent past GI work up  Patient is appropriate for planned procedure(s) and anesthesia in an ambulatory setting   K. Scherry Ran , MD [phone]

## 2022-11-23 NOTE — Progress Notes (Signed)
Pt's states no medical or surgical changes since previsit or office visit. VS assessed by D.T 

## 2022-11-23 NOTE — Patient Instructions (Addendum)
YOU HAD AN ENDOSCOPIC PROCEDURE TODAY AT THE [facility]:   Refer to the procedure report that was given to you for any specific questions about what was found during the examination.  If the procedure report does not answer your questions, please call your gastroenterologist to clarify.  If you requested that your care partner not be given the details of your procedure findings, then the procedure report has been included in a sealed envelope for you to review at your convenience later.  YOU SHOULD EXPECT: Some feelings of bloating in the abdomen. Passage of more gas than usual.  Walking can help get rid of the air that was put into your GI tract during the procedure and reduce the bloating. If you had a lower endoscopy (such as a colonoscopy or flexible sigmoidoscopy) you may notice spotting of blood in your stool or on the toilet paper. If you underwent a bowel prep for your procedure, you may not have a normal bowel movement for a few days.  Please Note:  You might notice some irritation and congestion in your nose or some drainage.  This is from the oxygen used during your procedure.  There is no need for concern and it should clear up in a day or so.  SYMPTOMS TO REPORT IMMEDIATELY:  Following lower endoscopy (colonoscopy or flexible sigmoidoscopy):  Excessive amounts of blood in the stool  Significant tenderness or worsening of abdominal pains  Swelling of the abdomen that is new, acute  Fever of 100F or higher   For urgent or emergent issues, a gastroenterologist can be reached at any hour by calling [phone]. Do not use MyChart messaging for urgent concerns.    DIET:  We do recommend a small meal at first, but then you may proceed to your regular diet.  Drink plenty of fluids but you should avoid alcoholic beverages for 24 hours.  MEDICATIONS: Continue present medications. Use Benefiber one teaspoon by mouth three times daily. Use Analpram HC Cream 2.5%: Apply  externally twice daily for 5 days.  Please see handouts given to you by your recovery nurse: Diverticulosis, Hemorrhoids.  FOLLOW UP: Repeat colonoscopy in 5 years for surveillance.  Thank you for allowing [place] to provide for your healthcare needs today.  ACTIVITY:  You should plan to take it easy for the rest of today and you should NOT DRIVE or use heavy machinery until tomorrow (because of the sedation medicines used during the test).    FOLLOW UP: Our staff will call the number listed on your records the next business day following your procedure.  We will call around 7:15- 8:00 am to check on you and address any questions or concerns that you may have regarding the information given to you following your procedure. If we do not reach you, we will leave a message.     If any biopsies were taken you will be contacted by phone or by letter within the next 1-3 weeks.  Please call us at [phone] if you have not heard about the biopsies in 3 weeks.    SIGNATURES/CONFIDENTIALITY: You and/or your care partner have signed paperwork which will be entered into your electronic medical record.  These signatures attest to the fact that that the information above on your After Visit Summary has been reviewed and is understood.  Full responsibility of the confidentiality of this discharge information lies with you and/or your care-partner.

## 2022-11-24 ENCOUNTER — Telehealth: Payer: Self-pay | Admitting: *Deleted

## 2022-11-24 ENCOUNTER — Encounter: Payer: Self-pay | Admitting: Gastroenterology

## 2022-11-24 NOTE — Telephone Encounter (Signed)
  Follow up Call-     11/23/2022    7:51 AM  Call back number  Post procedure Call Back phone  # [phone]  Permission to leave phone message Yes     Patient questions:  Do you have a fever, pain , or abdominal swelling? No. Pain Score  0 *  Have you tolerated food without any problems? Yes.    Have you been able to return to your normal activities? Yes.    Do you have any questions about your discharge instructions: Diet   No. Medications  No. Follow up visit  No.  Do you have questions or concerns about your Care? No.  Actions: * If pain score is 4 or above: No action needed, pain <4.

## 2022-11-28 ENCOUNTER — Encounter: Payer: Self-pay | Admitting: Family Medicine

## 2022-11-29 ENCOUNTER — Encounter: Payer: Self-pay | Admitting: Family Medicine

## 2022-11-30 ENCOUNTER — Encounter: Payer: Self-pay | Admitting: Family Medicine

## 2022-12-10 ENCOUNTER — Other Ambulatory Visit: Payer: Self-pay

## 2022-12-10 ENCOUNTER — Emergency Department (HOSPITAL_BASED_OUTPATIENT_CLINIC_OR_DEPARTMENT_OTHER): Payer: Medicare HMO

## 2022-12-10 ENCOUNTER — Emergency Department (HOSPITAL_BASED_OUTPATIENT_CLINIC_OR_DEPARTMENT_OTHER)
Admission: EM | Admit: 2022-12-10 | Discharge: 2022-12-10 | Disposition: A | Discharge status: Home / Self Care | Payer: Medicare HMO | Attending: Emergency Medicine | Admitting: Emergency Medicine

## 2022-12-10 ENCOUNTER — Encounter (HOSPITAL_BASED_OUTPATIENT_CLINIC_OR_DEPARTMENT_OTHER): Payer: Self-pay | Admitting: Emergency Medicine

## 2022-12-10 DIAGNOSIS — S199XXA Unspecified injury of neck, initial encounter: Secondary | ICD-10-CM | POA: Diagnosis not present

## 2022-12-10 DIAGNOSIS — Y9301 Activity, walking, marching and hiking: Secondary | ICD-10-CM | POA: Diagnosis not present

## 2022-12-10 DIAGNOSIS — W01198A Fall on same level from slipping, tripping and stumbling with subsequent striking against other object, initial encounter: Secondary | ICD-10-CM | POA: Diagnosis not present

## 2022-12-10 DIAGNOSIS — S0101XA Laceration without foreign body of scalp, initial encounter: Secondary | ICD-10-CM | POA: Insufficient documentation

## 2022-12-10 DIAGNOSIS — M4312 Spondylolisthesis, cervical region: Secondary | ICD-10-CM | POA: Diagnosis not present

## 2022-12-10 DIAGNOSIS — M47812 Spondylosis without myelopathy or radiculopathy, cervical region: Secondary | ICD-10-CM | POA: Diagnosis not present

## 2022-12-10 DIAGNOSIS — M8580 Other specified disorders of bone density and structure, unspecified site: Secondary | ICD-10-CM | POA: Diagnosis not present

## 2022-12-10 DIAGNOSIS — S0990XA Unspecified injury of head, initial encounter: Secondary | ICD-10-CM | POA: Diagnosis not present

## 2022-12-10 MED ORDER — LIDOCAINE-EPINEPHRINE-TETRACAINE (LET) TOPICAL GEL
3.0000 mL | Freq: Once | TOPICAL | Status: AC
  Administered 2022-12-10: 3 mL via TOPICAL
  Filled 2022-12-10: qty 3

## 2022-12-10 NOTE — ED Provider Notes (Signed)
[facility] AT [facility] Provider Note   CSN: [id number] Arrival date & time: 12/10/22  1604     History  Chief Complaint  Patient presents with   Fall   Head Injury    Carolyn Cooper is a 67 y.o. female.  The history is provided by the patient and medical records. No language interpreter was used.  Fall  Head Injury    67 year old female with significant history of migraine, IBS, anxiety, presenting for evaluation of head injury.  Patient report approximate 3 hours ago she was walking, tripped over a post, fell and struck her head against concrete floor.  She denies any loss of consciousness.  She was able to get up on her own and ambulate afterward.  She did notice some bleeding coming from head but report minimal pain.  Rates her pain as throbbing, 2 out of 10 and nonradiating.  She denies any lightheadedness or dizziness no nausea vomiting no vision changes no focal numbness or focal weakness no significant neck pain.  She is up-to-date with immunization.  She denies any precipitating symptoms prior to the fall.  She is not on any blood thinner medication.  Home Medications Prior to Admission medications   Medication Sig Start Date End Date Taking? Authorizing Provider  anastrozole (ARIMIDEX) 1 MG tablet Take 1 tablet (1 mg total) by mouth daily. 08/12/22   Rachel Moulds, MD  Calcium Citrate-Vitamin D (CALCIUM + D PO) Take by mouth daily. 1200 - 1000 mg tablet    [provider]  escitalopram (LEXAPRO) 20 MG tablet TAKE 1 TABLET BY MOUTH EVERY DAY 08/31/22   Gottschalk, Ashly M, DO  Galcanezumab-gnlm (EMGALITY) 120 MG/ML SOAJ Inject 120 mg into the skin every 30 (thirty) days. 06/21/22   Raliegh Ip, DO  hydrocortisone-pramoxine (ANALPRAM HC) 2.5-1 % rectal cream Place 1 Application rectally 2 (two) times daily. Use Analpram HC cream 2.5%; Apply externally twice daily for 5 days. 11/23/22   Napoleon Form, MD  omeprazole  (PRILOSEC) 40 MG capsule Take 1 capsule (40 mg total) by mouth daily. 12/30/21   Unk Lightning, PA  phentermine (ADIPEX-P) 37.5 MG tablet Take 0.5-1 tablets (18.75-37.5 mg total) by mouth daily before breakfast. 08/23/22   Raliegh Ip, DO  SUMAtriptan (IMITREX) 100 MG tablet May repeat in 2 hours if headache persists or recurs. 08/11/22   Raliegh Ip, DO      Allergies    Sulfa antibiotics    Review of Systems   Review of Systems  All other systems reviewed and are negative.   Physical Exam Updated Vital Signs BP 130/79   Pulse 76   Temp 98.1 F (36.7 C) (Oral)   Resp 18   SpO2 100%  Physical Exam Vitals and nursing note reviewed.  Constitutional:      General: She is not in acute distress.    Appearance: She is well-developed.     Comments: Patient is resting comfortably in bed appears to be in no acute discomfort.  HENT:     Head: Normocephalic.     Comments: On the frontal scalp there is a very superficial 4 cm vertical laceration with surrounding dried blood and no foreign body noted.  No crepitus not actively bleeding.  No raccoon's eyes, no Battle sign Eyes:     Extraocular Movements: Extraocular movements intact.     Conjunctiva/sclera: Conjunctivae normal.     Pupils: Pupils are equal, round, and reactive to light.  Cardiovascular:  Rate and Rhythm: Normal rate and regular rhythm.     Pulses: Normal pulses.     Heart sounds: Normal heart sounds.  Pulmonary:     Effort: Pulmonary effort is normal.  Abdominal:     Palpations: Abdomen is soft.     Tenderness: There is no abdominal tenderness.  Musculoskeletal:     Cervical back: Normal range of motion and neck supple. No rigidity or tenderness.     Comments: No significant tenderness to bilateral knees or hands  Lymphadenopathy:     Cervical: No cervical adenopathy.  Skin:    Findings: No rash.  Neurological:     Mental Status: She is alert and oriented to person, place, and time.   Psychiatric:        Mood and Affect: Mood normal.     ED Results / Procedures / Treatments   Labs (all labs ordered are listed, but only abnormal results are displayed) Labs Reviewed - No data to display  EKG None  Radiology CT Cervical Spine Wo Contrast  Result Date: 12/10/2022 CLINICAL DATA:  Fall and trauma to the neck. EXAM: CT CERVICAL SPINE WITHOUT CONTRAST TECHNIQUE: Multidetector CT imaging of the cervical spine was performed without intravenous contrast. Multiplanar CT image reconstructions were also generated. RADIATION DOSE REDUCTION: This exam was performed according to the departmental dose-optimization program which includes automated exposure control, adjustment of the mA and/or kV according to patient size and/or use of iterative reconstruction technique. COMPARISON:  None Available. FINDINGS: Alignment: No acute subluxation.  Grade 1 C4-C5 anterolisthesis. Skull base and vertebrae: No acute fracture.  Osteopenia. Soft tissues and spinal canal: No prevertebral fluid or swelling. No visible canal hematoma. Disc levels:  No acute findings.  Degenerative changes. Upper chest: Negative. Other: None IMPRESSION: 1. No acute/traumatic cervical spine pathology. 2. Degenerative changes. Electronically Signed   By: Elgie Collard M.D.   On: 12/10/2022 19:44   CT Head Wo Contrast  Result Date: 12/10/2022 CLINICAL DATA:  Trauma EXAM: CT HEAD WITHOUT CONTRAST TECHNIQUE: Contiguous axial images were obtained from the base of the skull through the vertex without intravenous contrast. RADIATION DOSE REDUCTION: This exam was performed according to the departmental dose-optimization program which includes automated exposure control, adjustment of the mA and/or kV according to patient size and/or use of iterative reconstruction technique. COMPARISON:  MRI brain 03/20/2007 FINDINGS: Brain: No evidence of acute infarction, hemorrhage, hydrocephalus, extra-axial collection or mass lesion/mass effect.  Vascular: No hyperdense vessel or unexpected calcification. Skull: Normal. Negative for fracture or focal lesion. Sinuses/Orbits: No acute finding. Other: None. IMPRESSION: No acute intracranial abnormality. Electronically Signed   By: Darliss Cheney M.D.   On: 12/10/2022 19:41    Procedures .Marland KitchenLaceration Repair  Date/Time: 12/10/2022 9:08 PM  Performed by: Fayrene Helper, PA-C Authorized by: Fayrene Helper, PA-C   Consent:    Consent obtained:  Verbal   Consent given by:  Patient   Risks, benefits, and alternatives were discussed: yes     Risks discussed:  Infection and poor wound healing   Alternatives discussed:  No treatment Universal protocol:    Procedure explained and questions answered to patient or proxy's satisfaction: yes     Relevant documents present and verified: yes     Patient identity confirmed:  Verbally with patient and arm band Anesthesia:    Anesthesia method:  Topical application Laceration details:    Location:  Scalp   Scalp location:  Mid-scalp   Length (cm):  4   Depth (mm):  2 Pre-procedure details:    Preparation:  Imaging obtained to evaluate for foreign bodies Exploration:    Limited defect created (wound extended): no     Hemostasis achieved with:  LET   Imaging outcome: foreign body not noted     Wound exploration: wound explored through full range of motion and entire depth of wound visualized     Wound extent: no signs of injury, no underlying fracture and no vascular damage     Contaminated: no   Treatment:    Area cleansed with:  Saline   Amount of cleaning:  Standard   Irrigation solution:  Sterile saline   Irrigation method:  Pressure wash   Visualized foreign bodies/material removed: no     Debridement:  None   Undermining:  None   Scar revision: no   Skin repair:    Repair method:  Tissue adhesive Approximation:    Approximation:  Close Repair type:    Repair type:  Simple Post-procedure details:    Procedure completion:  Tolerated  well, no immediate complications     Medications Ordered in ED Medications  lidocaine-EPINEPHrine-tetracaine (LET) topical gel (3 mLs Topical Given 12/10/22 1846)    ED Course/ Medical Decision Making/ A&P                                 Medical Decision Making Amount and/or Complexity of Data Reviewed Radiology: ordered.   BP 130/79   Pulse 76   Temp 98.1 F (36.7 C) (Oral)   Resp 18   SpO2 100%   22:45 PM 67 year old female with significant history of migraine, IBS, anxiety, presenting for evaluation of head injury.  Patient report approximate 3 hours ago she was walking, tripped over a post, fell and struck her head against concrete floor.  She denies any loss of consciousness.  She was able to get up on her own and ambulate afterward.  She did notice some bleeding coming from head but report minimal pain.  Rates her pain as throbbing, 2 out of 10 and nonradiating.  She denies any lightheadedness or dizziness no nausea vomiting no vision changes no focal numbness or focal weakness no significant neck pain.  She is up-to-date with immunization.  She denies any precipitating symptoms prior to the fall.  She is not on any blood thinner medication.  On exam this is a well-appearing female resting comfortably in bed appears to be in no acute discomfort.  She does have some dried blood noted to the right frontal scalp with a shallow 4 cm vertical laceration noted not actively bleeding without any crepitus.  No other signs of injury noted.  No tenderness to all 4 extremities.  She is mentating appropriately.  Vital signs overall reassuring.  -Labs considered but due to mechanical injury, labs not warranted -The patient was maintained on a cardiac monitor.  I personally viewed and interpreted the cardiac monitored which showed an underlying rhythm of: NSR -Imaging independently viewed and interpreted by me and I agree with radiologist's interpretation.  Result remarkable for head and cspine  CT without acute changes -This patient presents to the ED for concern of fall, this involves an extensive number of treatment options, and is a complaint that carries with it a high risk of complications and morbidity.  The differential diagnosis includes mechanical fall, mi, stroke, anemia, dehydration, metabolic derangement -Co morbidities that complicate the patient evaluation includes osteopenia, cataract -Treatment includes  LET -Reevaluation of the patient after these medicines showed that the patient improved -PCP office notes or outside notes reviewed -Discussion with specialist attending Dr. Lockie Mola -Escalation to admission/observation considered: patients feels much better, is comfortable with discharge, and will follow up with PCP -Prescription medication considered, patient comfortable with OTC meds -Social Determinant of Health considered which includes lack of physical activity         Final Clinical Impression(s) / ED Diagnoses Final diagnoses:  Scalp laceration, initial encounter    Rx / DC Orders ED Discharge Orders     None         Fayrene Helper, PA-C 12/10/22 2110    Virgina Norfolk, DO 12/10/22 2228

## 2022-12-10 NOTE — Discharge Instructions (Signed)
You have been evaluated for your fall.  Fortunately CT scans did not show any broken bone, no blood in your brain and no other significant injury.  You did suffered a laceration to your scalp for which we treated with adhesive liquid.  Avoid washing your hair for the next 48 hours.  Do not submerge the wound in water for the next few days to allow for it to heal appropriately.  The glue will dissolve on their own.  If you develop any signs of infection please return.

## 2022-12-10 NOTE — ED Triage Notes (Signed)
Trip fall. Hit top of head on concrete. Lac on top of head. No blood thinner. No loc Gcs 15 in triage  Happened around 3pm Took motrin pta

## 2022-12-20 ENCOUNTER — Encounter (INDEPENDENT_AMBULATORY_CARE_PROVIDER_SITE_OTHER): Payer: Self-pay | Admitting: Family Medicine

## 2022-12-20 DIAGNOSIS — Z713 Dietary counseling and surveillance: Secondary | ICD-10-CM

## 2022-12-21 ENCOUNTER — Other Ambulatory Visit: Payer: Self-pay | Admitting: Family

## 2022-12-21 DIAGNOSIS — Z713 Dietary counseling and surveillance: Secondary | ICD-10-CM

## 2022-12-21 MED ORDER — TIRZEPATIDE-WEIGHT MANAGEMENT 2.5 MG/0.5ML ~~LOC~~ SOLN
2.5000 mg | SUBCUTANEOUS | 1 refills | Status: DC
Start: 2022-12-21 — End: 2022-12-21

## 2022-12-21 NOTE — Telephone Encounter (Signed)
Hello, I have sent a prescription for the Zepbound to your pharmacy. I am unsure they will pay for it and how much it will be out of pocket.   [name], FNP   Approximately 5 minutes was spent documenting and reviewing patient's chart.

## 2022-12-21 NOTE — Telephone Encounter (Signed)
ZEPBOUND 2.5 MG/0.5ML SOLN  Pharmacy comment: Alternative Requested:NOT COVERED BY INSURANCE.

## 2022-12-24 DIAGNOSIS — M25562 Pain in left knee: Secondary | ICD-10-CM | POA: Diagnosis not present

## 2022-12-29 ENCOUNTER — Other Ambulatory Visit: Payer: Self-pay | Admitting: Physician Assistant

## 2023-01-04 ENCOUNTER — Telehealth: Payer: Self-pay | Admitting: *Deleted

## 2023-01-04 NOTE — Telephone Encounter (Signed)
Transition Care Management Follow-up Telephone Call Date of discharge and from where: [facility]  12/10/2022 How have you been since you were released from the hospital? Was a head injury and it is healed and she is feeling well  Any questions or concerns? No  Items Reviewed: Did the pt receive and understand the discharge instructions provided? Yes  Medications obtained and verified? No  Other? No  Any new allergies since your discharge? No  Dietary orders reviewed? No Do you have support at home? Yes     Follow up appointments reviewed:  PCP Hospital f/u appt confirmed? No   Was a head injury and it is healed and she is feeling well  Are transportation arrangements needed? Yes  If their condition worsens, is the pt aware to call PCP or go to the Emergency Dept.? Yes Was the patient provided with contact information for the PCP's office or ED? Yes Was to pt encouraged to call back with questions or concerns? Yes  Dione Booze [facility]  Population Health Careguide  Direct Dial: [phone] Website: [email or web address]

## 2023-03-05 ENCOUNTER — Encounter: Payer: Self-pay | Admitting: Family Medicine

## 2023-03-07 NOTE — Telephone Encounter (Signed)
I'm DOD tomorrow. Ok to offer tomorrow appt

## 2023-03-08 ENCOUNTER — Encounter: Payer: Self-pay | Admitting: Family Medicine

## 2023-03-08 ENCOUNTER — Ambulatory Visit (INDEPENDENT_AMBULATORY_CARE_PROVIDER_SITE_OTHER): Payer: Medicare HMO | Admitting: Family Medicine

## 2023-03-08 VITALS — BP 129/82 | HR 74 | Temp 98.6°F | Ht 65.0 in

## 2023-03-08 DIAGNOSIS — M7061 Trochanteric bursitis, right hip: Secondary | ICD-10-CM

## 2023-03-08 DIAGNOSIS — M7062 Trochanteric bursitis, left hip: Secondary | ICD-10-CM

## 2023-03-08 NOTE — Progress Notes (Signed)
Subjective: [id number] trochanteric bursitis PCP: Raliegh Ip, DO [id number] Carolyn Cooper is a 67 y.o. female presenting to clinic today for:  1. Bilateral trochanteric bursitis Chronic issue for pt.  Last injections done >1 year ago.  She reports current flare up started she notes that she is been having some exacerbation over the last couple of weeks.  She has had some issues with her knees and is undergoing viscosupplementation.  She is under the care of orthopedist for this.   ROS: Per HPI  Allergies  Allergen Reactions   Sulfa Antibiotics Hives   Past Medical History:  Diagnosis Date   Allergy    seasonal   Anxiety    Barrett's esophagus    Breast cancer (HCC) 01/2020   left breast ILC   Cataract    had surgery   Depression    Family history of breast cancer    Family history of kidney cancer    Family history of ovarian cancer    Family history of stomach cancer    GERD (gastroesophageal reflux disease)    IBS (irritable bowel syndrome)    Migraines    Osteopenia 2019   T score -1.5 FRAX 8% / 0.7%   Personal history of radiation therapy     Current Outpatient Medications:    anastrozole (ARIMIDEX) 1 MG tablet, Take 1 tablet (1 mg total) by mouth daily., Disp: 90 tablet, Rfl: 4   Calcium Citrate-Vitamin D (CALCIUM + D PO), Take by mouth daily. 1200 - 1000 mg tablet, Disp: , Rfl:    escitalopram (LEXAPRO) 20 MG tablet, TAKE 1 TABLET BY MOUTH EVERY DAY, Disp: 90 tablet, Rfl: 1   Galcanezumab-gnlm (EMGALITY) 120 MG/ML SOAJ, Inject 120 mg into the skin every 30 (thirty) days., Disp: 3 mL, Rfl: 11   hydrocortisone-pramoxine (ANALPRAM HC) 2.5-1 % rectal cream, Place 1 Application rectally 2 (two) times daily. Use Analpram HC cream 2.5%; Apply externally twice daily for 5 days., Disp: 10 g, Rfl: 0   omeprazole (PRILOSEC) 40 MG capsule, TAKE 1 CAPSULE (40 MG TOTAL) BY MOUTH DAILY., Disp: 90 capsule, Rfl: 0   phentermine (ADIPEX-P) 37.5 MG tablet, Take 0.5-1  tablets (18.75-37.5 mg total) by mouth daily before breakfast., Disp: 30 tablet, Rfl: 5   SUMAtriptan (IMITREX) 100 MG tablet, May repeat in 2 hours if headache persists or recurs., Disp: 9 tablet, Rfl: 12   ZEPBOUND 2.5 MG/0.5ML SOLN, INJECT 2.5 MG SUBCUTANEOUSLY WEEKLY, Disp: 2 mL, Rfl: 1 Social History   Socioeconomic History   Marital status: Married    Spouse name: Not on file   Number of children: Not on file   Years of education: Not on file   Highest education level: Associate degree: occupational, Scientist, product/process development, or vocational program  Occupational History   Not on file  Tobacco Use   Smoking status: Never   Smokeless tobacco: Never  Vaping Use   Vaping status: Never Used  Substance and Sexual Activity   Alcohol use: Not Currently    Alcohol/week: 0.0 standard drinks of alcohol   Drug use: No   Sexual activity: Not Currently    Partners: Male    Birth control/protection: Post-menopausal    Comment: 1st intercourse 67 yo-Fewer than 5 partners (husband diagnosed with multiple myeloma)  Other Topics Concern   Not on file  Social History Narrative   Not on file   Social Determinants of Health   Financial Resource Strain: Low Risk  (08/31/2022)   Overall Financial Resource Strain (CARDIA)  Difficulty of Paying Living Expenses: Not hard at all  Food Insecurity: No Food Insecurity (08/31/2022)   Hunger Vital Sign    Worried About Running Out of Food in the Last Year: Never true    Ran Out of Food in the Last Year: Never true  Transportation Needs: No Transportation Needs (07/09/2022)   PRAPARE - Administrator, Civil Service (Medical): No    Lack of Transportation (Non-Medical): No  Physical Activity: Inactive (08/31/2022)   Exercise Vital Sign    Days of Exercise per Week: 0 days    Minutes of Exercise per Session: 0 min  Stress: No Stress Concern Present (08/31/2022)   [employer] of Occupational Health - Occupational Stress Questionnaire    Feeling of  Stress : Not at all  Social Connections: Socially Integrated (08/31/2022)   Social Connection and Isolation Panel [NHANES]    Frequency of Communication with Friends and Family: More than three times a week    Frequency of Social Gatherings with Friends and Family: More than three times a week    Attends Religious Services: More than 4 times per year    Active Member of [employer] or Organizations: Yes    Attends Engineer, structural: More than 4 times per year    Marital Status: Married  Catering manager Violence: Not At Risk (08/31/2022)   Humiliation, Afraid, Rape, and Kick questionnaire    Fear of Current or Ex-Partner: No    Emotionally Abused: No    Physically Abused: No    Sexually Abused: No   Family History  Problem Relation Age of Onset   Ovarian cancer Maternal Grandmother 15   Diverticulitis Mother    Heart disease Father    Kidney cancer Father 21   Stomach cancer Maternal Grandfather        dx 38s   Breast cancer Other        dx >50, maternal great-aunt   Breast cancer Other        dx >50, maternal great-aunt   Breast cancer Other        dx >50, maternal great-aunt   Colon cancer Neg Hx    Esophageal cancer Neg Hx    Rectal cancer Neg Hx     Objective: Office vital signs reviewed. BP 129/82   Pulse 74   Temp 98.6 F (37 C)   Ht 5\' 5"  (1.651 m)   SpO2 98%   BMI 25.29 kg/m   Physical Examination:  MSK: Ambulating independently with minimally antalgic gait and station.  Tenderness palpation along the trochanteric bursa bilaterally  Left sided bursa INJECTION:  Patient denies allergy to antiseptics (including iodine) and anesthetics.  Patient denies h/o diabetes, frequent steroid use, use of blood thinners/ antiplatelets.  Patient was given informed consent and a signed copy has been placed in the chart. Appropriate time out was taken. Area prepped and draped in usual sterile fashion. Anatomic landmarks were identified and injection site was marked.   Ethyl chloride spray was used to numb the area and 1 cc of methylprednisolone 40 mg/ml plus  1 cc of 1% lidocaine without epinephrine was injected into the left trochanteric bursa. The patient tolerated the procedure well and there were no immediate complications. Estimated blood loss is less than 1 cc.  Post procedure instructions were reviewed and handout outlining these instructions were provided to patient.  Right sided bursa INJECTION:  Patient denies allergy to antiseptics (including iodine) and anesthetics.  Patient denies h/o diabetes,  frequent steroid use, use of blood thinners/ antiplatelets.  Patient was given informed consent and a signed copy has been placed in the chart. Appropriate time out was taken. Area prepped and draped in usual sterile fashion. Anatomic landmarks were identified and injection site was marked.  Ethyl chloride spray was used to numb the area and 1 cc of methylprednisolone 40 mg/ml plus  1 cc of 1% lidocaine without epinephrine was injected into the right trochanteric bursa. The patient tolerated the procedure well and there were no immediate complications. Estimated blood loss is less than 1 cc.  Post procedure instructions were reviewed and handout outlining these instructions were provided to patient.  Assessment/ Plan: 67 y.o. female   Trochanteric bursitis of both hips  Treated with trochanteric bursa injections bilaterally.  She tolerated procedure without difficulty.  Home care instructions reviewed and reasons for reevaluation discussed.  She may follow-up in 6 months for annual physical fasting labs or as needed going forward   Carolyn Miguez Hulen Skains, DO [facility] [phone]

## 2023-03-09 MED ORDER — METHYLPREDNISOLONE ACETATE 80 MG/ML IJ SUSP
80.0000 mg | Freq: Once | INTRAMUSCULAR | Status: AC
Start: 2023-03-09 — End: 2023-03-08
  Administered 2023-03-08: 40 mg via INTRAMUSCULAR

## 2023-03-09 MED ORDER — METHYLPREDNISOLONE ACETATE 80 MG/ML IJ SUSP
80.0000 mg | Freq: Once | INTRAMUSCULAR | Status: DC
Start: 2023-03-09 — End: 2023-03-09

## 2023-03-09 NOTE — Addendum Note (Signed)
Addended by: Waynette Buttery on: 03/09/2023 02:26 PM   Modules accepted: Orders

## 2023-03-10 DIAGNOSIS — M1712 Unilateral primary osteoarthritis, left knee: Secondary | ICD-10-CM | POA: Diagnosis not present

## 2023-03-17 DIAGNOSIS — M1712 Unilateral primary osteoarthritis, left knee: Secondary | ICD-10-CM | POA: Diagnosis not present

## 2023-03-24 DIAGNOSIS — M1712 Unilateral primary osteoarthritis, left knee: Secondary | ICD-10-CM | POA: Diagnosis not present

## 2023-03-26 ENCOUNTER — Other Ambulatory Visit: Payer: Self-pay | Admitting: Physician Assistant

## 2023-04-01 ENCOUNTER — Other Ambulatory Visit: Payer: Self-pay | Admitting: Family Medicine

## 2023-04-01 DIAGNOSIS — F419 Anxiety disorder, unspecified: Secondary | ICD-10-CM

## 2023-04-11 ENCOUNTER — Encounter: Payer: Self-pay | Admitting: Family Medicine

## 2023-04-11 DIAGNOSIS — S8992XS Unspecified injury of left lower leg, sequela: Secondary | ICD-10-CM

## 2023-04-18 DIAGNOSIS — M1712 Unilateral primary osteoarthritis, left knee: Secondary | ICD-10-CM | POA: Diagnosis not present

## 2023-04-20 ENCOUNTER — Encounter: Payer: Self-pay | Admitting: Gastroenterology

## 2023-05-02 DIAGNOSIS — L814 Other melanin hyperpigmentation: Secondary | ICD-10-CM | POA: Diagnosis not present

## 2023-05-02 DIAGNOSIS — D1801 Hemangioma of skin and subcutaneous tissue: Secondary | ICD-10-CM | POA: Diagnosis not present

## 2023-05-02 DIAGNOSIS — D485 Neoplasm of uncertain behavior of skin: Secondary | ICD-10-CM | POA: Diagnosis not present

## 2023-05-02 DIAGNOSIS — L738 Other specified follicular disorders: Secondary | ICD-10-CM | POA: Diagnosis not present

## 2023-05-02 DIAGNOSIS — L821 Other seborrheic keratosis: Secondary | ICD-10-CM | POA: Diagnosis not present

## 2023-05-02 DIAGNOSIS — L570 Actinic keratosis: Secondary | ICD-10-CM | POA: Diagnosis not present

## 2023-05-02 DIAGNOSIS — D225 Melanocytic nevi of trunk: Secondary | ICD-10-CM | POA: Diagnosis not present

## 2023-05-05 DIAGNOSIS — M1712 Unilateral primary osteoarthritis, left knee: Secondary | ICD-10-CM | POA: Diagnosis not present

## 2023-05-11 ENCOUNTER — Encounter: Payer: Self-pay | Admitting: Nurse Practitioner

## 2023-05-11 ENCOUNTER — Ambulatory Visit (INDEPENDENT_AMBULATORY_CARE_PROVIDER_SITE_OTHER): Payer: Medicare HMO | Admitting: Nurse Practitioner

## 2023-05-11 VITALS — BP 122/78 | HR 72 | Ht 65.5 in | Wt 162.0 lb

## 2023-05-11 DIAGNOSIS — Z780 Asymptomatic menopausal state: Secondary | ICD-10-CM

## 2023-05-11 DIAGNOSIS — Z853 Personal history of malignant neoplasm of breast: Secondary | ICD-10-CM

## 2023-05-11 DIAGNOSIS — M85852 Other specified disorders of bone density and structure, left thigh: Secondary | ICD-10-CM

## 2023-05-11 DIAGNOSIS — Z9189 Other specified personal risk factors, not elsewhere classified: Secondary | ICD-10-CM

## 2023-05-11 DIAGNOSIS — Z01419 Encounter for gynecological examination (general) (routine) without abnormal findings: Secondary | ICD-10-CM

## 2023-05-11 NOTE — Progress Notes (Signed)
 Carolyn Cooper [birth year] [id number]   History:  68 y.o. G4P4 presents for breast and pelvic exam. Postmenopausal - no HRT, no bleeding. 1996 cone biopsy. 01/2020 ER+ left breast cancer managed with lumpectomy, radiation, and aromatase inhibitor.   Gynecologic History No LMP recorded. Patient is postmenopausal.   Contraception: post menopausal status Sexually active: No  Health maintenance Last Pap: 05/06/2022. Results were: Normal neg HPV Last mammogram: 09/16/2022. Results were: Normal Last colonoscopy: 11/23/2022, 5-year recall Last Dexa: 04/06/2022. Results were: T-score -2.1, FRAX 10.9% / 1.7% Exercising: No Smoker: no   Past medical history, past surgical history, family history and social history were all reviewed and documented in the EPIC chart. Married. Husband has multiple myeloma, doing well. Retired. 4 children, 4 grandchildren ages 51, 32, 34 and 47. All live local.   ROS:  A ROS was performed and pertinent positives and negatives are included.  Exam:  Vitals:   05/11/23 0958  BP: 122/78  Pulse: 72  SpO2: 99%  Weight: 162 lb (73.5 kg)  Height: 5' 5.5 (1.664 m)      Body mass index is 26.55 kg/m.  General appearance:  Normal Thyroid :  Symmetrical, normal in size, without palpable masses or nodularity. Respiratory  Auscultation:  Clear without wheezing or rhonchi Cardiovascular  Auscultation:  Regular rate, without rubs, murmurs or gallops  Edema/varicosities:  Not grossly evident Abdominal  Soft,nontender, without masses, guarding or rebound.  Liver/spleen:  No organomegaly noted  Hernia:  None appreciated  Skin  Inspection:  Grossly normal Breasts: Examined lying and sitting.   Right: Without masses, retractions, discharge or axillary adenopathy.   Left: Without masses, retractions, discharge or axillary adenopathy. Pelvic: External genitalia:  no lesions              Urethra:  normal appearing urethra with no masses, tenderness or lesions               Bartholins and Skenes: normal                 Vagina: normal appearing vagina with normal color and discharge, no lesions. Atrophic changes              Cervix: no lesions Bimanual Exam:  Uterus:  no masses or tenderness              Adnexa: no mass, fullness, tenderness              Rectovaginal: Deferred              Anus:  normal, no lesions  Patient informed chaperone available to be present for breast and pelvic exam. Patient has requested no chaperone to be present. Patient has been advised what will be completed during breast and pelvic exam.   Assessment/Plan:  68 y.o. G4P4 for breast and pelvic exam.   Encounter for breast and pelvic examination - Education provided on SBEs, importance of preventative screenings, current guidelines, high calcium diet, regular exercise, and multivitamin daily.  Labs with PCP.   Postmenopausal - no HRT, no bleeding  Osteopenia of neck of left femur - T-score -2.1 without elevated FRAX 04/2022. Continue Vitamin D  + Calcium and increase exercise. Oncology considering starting her on bisphosphonate.   History of left breast cancer - 01/2020 ER+ left breast cancer managed with lumpectomy, radiation, and aromatase inhibitor. Seeing oncology. Continue annual screenings. Normal mammogram 09/2022. Normal breast exam today.   Screening for cervical cancer -1996 cone ciopsy, otherwise normal palpation. No longer screening  per guidelines.   Screening for colon cancer - 2024 colonoscopy. Will repeat at 5-year interval per GI's recommendation.   Return in about 1 year (around 05/10/2024) for B&P (high risk).     [name], 10:29 AM 05/11/2023

## 2023-05-17 ENCOUNTER — Encounter: Payer: Self-pay | Admitting: Hematology and Oncology

## 2023-05-19 ENCOUNTER — Other Ambulatory Visit: Payer: Self-pay

## 2023-05-19 DIAGNOSIS — Z170 Estrogen receptor positive status [ER+]: Secondary | ICD-10-CM

## 2023-05-19 DIAGNOSIS — M8580 Other specified disorders of bone density and structure, unspecified site: Secondary | ICD-10-CM

## 2023-06-08 NOTE — Therapy (Signed)
 OUTPATIENT PHYSICAL THERAPY THORACOLUMBAR EVALUATION   Patient Name: Carolyn Cooper MRN: [id number] DOB:[birth year], 68 y.o., female Today's Date: 06/09/2023  END OF SESSION:  PT End of Session - 06/09/23 1011     Visit Number 1    Date for PT Re-Evaluation 08/04/23    Authorization Type [employer]    Progress Note Due on Visit 10    PT Start Time 0932    PT Stop Time 1011    PT Time Calculation (min) 39 min    Activity Tolerance Patient tolerated treatment well    Behavior During Therapy WFL for tasks assessed/performed             Past Medical History:  Diagnosis Date   Allergy    seasonal   Anxiety    Barrett's esophagus    Breast cancer (HCC) 01/2020   left breast ILC   Cataract    had surgery   Depression    Family history of breast cancer    Family history of kidney cancer    Family history of ovarian cancer    Family history of stomach cancer    GERD (gastroesophageal reflux disease)    IBS (irritable bowel syndrome)    Migraines    Osteopenia 2019   T score -1.5 FRAX 8% / 0.7%   Personal history of radiation therapy    Past Surgical History:  Procedure Laterality Date   BREAST LUMPECTOMY     BREAST LUMPECTOMY WITH RADIOACTIVE SEED AND SENTINEL LYMPH NODE BIOPSY Left 02/14/2020   Procedure: LEFT BREAST LUMPECTOMY WITH RADIOACTIVE SEED AND SENTINEL LYMPH NODE MAPPING;  Surgeon: Harriette Bouillon, MD;  Location: [facility];  Service: General;  Laterality: Left;  PECTORAL BLOCK   CATARACT EXTRACTION, BILATERAL  2018   CERVICAL CONE BIOPSY  1996   COLONOSCOPY  07/21/2007   CYSTOSCOPY     MOUTH SURGERY  1978   WISDOM TEETH   Patient Active Problem List   Diagnosis Date Noted   Pure hypercholesterolemia 08/23/2022   Overweight (BMI 25.0-29.9) 08/23/2022   Genetic testing 02/13/2020   Family history of ovarian cancer    Family history of breast cancer    Family history of stomach cancer    Family history of kidney cancer     Malignant neoplasm of upper-outer quadrant of left breast in female, estrogen receptor positive (HCC) 01/30/2020   Bursitis of both hips 08/01/2018   Anxiety 10/17/2017   Microscopic hematuria 09/13/2015   Vitamin D deficiency 09/11/2015   Osteopenia 01/30/2014   Migraine headache 01/30/2014    PCP: Delynn Flavin, MD  REFERRING PROVIDER: De Blanch, LPN   REFERRING DIAG:  Diagnosis  C50.412,Z17.0 (ICD-10-CM) - Malignant neoplasm of upper-outer quadrant of left breast in female, estrogen receptor positive (HCC)  M85.80 (ICD-10-CM) - Osteopenia, unspecified location  Hip/gluteal pain   Rationale for Evaluation and Treatment: Rehabilitation  THERAPY DIAG:  Cramp and spasm - Plan: PT plan of care cert/re-cert  Pain in right leg - Plan: PT plan of care cert/re-cert  Pain in left leg - Plan: PT plan of care cert/re-cert  Muscle weakness (generalized) - Plan: PT plan of care cert/re-cert  Other abnormalities of gait and mobility - Plan: PT plan of care cert/re-cert  ONSET DATE: 04/2023  SUBJECTIVE:  SUBJECTIVE STATEMENT: Pt presents to PT with gluteal and leg pain bilaterally. Breast cancer in 2021 and is on antiestrogen treatment. She has history of bil hip bursitis and Lt knee OA and is receiving injections for this.    PERTINENT HISTORY:  Anxiety, Lt breast cancer 2021, osteopenia, migraines, Lt knee OA with injections   PAIN: 06/09/23 Are you having pain? Yes: NPRS scale: 2-7/10 Pain location: Rt>Lt gluteals and lateral legs Pain description: dull ache Aggravating factors: worse at night, walking (burns), steps  Relieving factors: icy hot, not performing aggravating tasks   PRECAUTIONS: Other: history of Lt breast cancer with antiestrogen treatment now  RED FLAGS: None   WEIGHT  BEARING RESTRICTIONS: No  FALLS:  Has patient fallen in last 6 months? Yes. Number of falls 1 (tripped over a board)  LIVING ENVIRONMENT: Lives with: lives with their spouse Lives in: House/apartment Stairs: Yes: Internal: 12 steps; can reach both Has following equipment at home: None  OCCUPATION: retired  PLOF: Independent with basic ADLs, Vocation/Vocational requirements: retired, and Leisure: Engineer, petroleum, read, Agricultural consultant work  PATIENT GOALS: reduce leg pain, get stronger, go up/down steps with increased ease   NEXT MD VISIT: July 2025  OBJECTIVE:  Note: Objective measures were completed at Evaluation unless otherwise noted.  DIAGNOSTIC FINDINGS:  None recent  Bil knees: x-ray 02/2023-OA in Lt knee   PATIENT SURVEYS:  3/5/25LEFS 60/80-75%   COGNITION: Overall cognitive status: Within functional limits for tasks assessed     SENSATION: WFL   POSTURE: flexed trunk   PALPATION: Diffuse palpable tenerness over Bil gluteals and lateral hips at ITB   LOWER EXTREMITY ROM:    Bil hip flexibility limited by 20% bil, Lt knee extension lacking 10 degrees   LOWER EXTREMITY MMT:   Bil hips 4/5, knees 5/5   FUNCTIONAL TESTS:  5 times sit to stand: 11.91 - reduced eccentric control, hip adduction bil SLR: pelvic drop bil and 3-5 seconds each   GAIT: Distance walked: 50 Assistive device utilized: None Level of assistance: Complete Independence Comments: shortened step length bil.  Steps: UE support required with ascending and descending, reduced eccentric control  TREATMENT DATE:  06/09/23 Findings from evaluation discussed, pt educated on plan of care, HEP initiated.                                                                                                                                 PATIENT EDUCATION:  Education details: Access Code: [email or web address] Person educated: Patient Education method: Explanation, Demonstration, and Handouts Education comprehension:  verbalized understanding and returned demonstration  HOME EXERCISE PROGRAM: Access Code: [email or web address] URL: [email or web address] Date: 06/09/2023 Prepared by: Tresa Endo  Exercises - Seated Hamstring Stretch  - 3 x daily - 7 x weekly - 1 sets - 3 reps - 20 hold - Seated Piriformis Stretch with Trunk Bend  - 3 x daily - 7 x weekly - 1 sets - 3 reps - 20 hold -  Supine Quad Set on Towel Roll  - 2 x daily - 7 x weekly - 1 sets - 10 reps - 5 hold - Clamshell  - 2 x daily - 7 x weekly - 2 sets - 10 reps - Sit to Stand  - 2 x daily - 7 x weekly - 1-2 sets - 5-10 reps  ASSESSMENT:  CLINICAL IMPRESSION: Patient is a 68 y.o. female who was seen today for physical therapy evaluation and treatment for bil hip and leg pain that began 04/2023.  Pt reports that her legs feel achy and sometimes burn with walking.  She had Lt breast cancer in 2021 with 1 lymph node removed.  She is on antiestrogen therapy now.  Pt rates pain as 2-7/10 and is worse with negotiating steps paired with Rt>Lt weakness with this, sleep at night and walking.   Pt demonstrates functional weakness with reduced eccentric control with stand to sit and descending steps.  Pt has pelvic drop with single leg stance.  Patient will benefit from skilled PT to address the below impairments and improve overall function.   OBJECTIVE IMPAIRMENTS: Abnormal gait, decreased activity tolerance, decreased endurance, difficulty walking, decreased ROM, decreased strength, increased muscle spasms, impaired flexibility, postural dysfunction, and pain.   ACTIVITY LIMITATIONS: carrying, lifting, sitting, standing, squatting, stairs, transfers, and locomotion level  PARTICIPATION LIMITATIONS: meal prep, cleaning, shopping, and community activity  PERSONAL FACTORS: Time since onset of injury/illness/exacerbation and 1-2 comorbidities: Lt knee OA, history of Lt breast cancer with antiestrogen therapy  are also affecting patient's functional outcome.    REHAB POTENTIAL: Good  CLINICAL DECISION MAKING: Stable/uncomplicated  EVALUATION COMPLEXITY: Low   GOALS: Goals reviewed with patient? Yes  SHORT TERM GOALS: Target date: 07/07/2023    Be independent in initial HEP Baseline: Goal status: INITIAL  2.  Perform sit to stand with neutral hip alignment and min UE support with control due to improved LE strength  Baseline:  Goal status: INITIAL  3.  Ascend steps with step-over-step gait with min use of UEs due to improved LE strength  Baseline: step to and moderate UE support Goal status: INITIAL  4.  Report > or = to 30% reduction in LE pain with standing and walking  Baseline:  Goal status: INITIAL   LONG TERM GOALS: Target date: 08/04/2023    Be independent in advanced HEP Baseline:  Goal status: INITIAL  2.  Improve LEFS to > or 70/80 due to improved function Baseline: 60/80 Goal status: INITIAL  3.  Improve LE strength to ascend and descend steps with step over step gait and use of 1 rail Baseline:  Goal status: INITIAL  4.  Report > or = to 60% reduction in LE pain with standing and walking  Baseline:  Goal status: INITIAL  5.  Perform single leg stance on Rt and Lt x 8-10 seconds with level pelvis to improve endurance and balance  Baseline:  Goal status: INITIAL   PLAN:  PT FREQUENCY: 1-2x/week  PT DURATION: 8 weeks  PLANNED INTERVENTIONS: 97110-Therapeutic exercises, 97530- Therapeutic activity, 97112- Neuromuscular re-education, 97535- Self Care, 16109- Manual therapy, [phone]- Canalith repositioning, [id number]- Aquatic Therapy, U9811- Electrical stimulation (unattended), [phone]- Electrical stimulation (manual), Balance training, Stair training, Taping, Dry Needling, Joint mobilization, Joint manipulation, Spinal manipulation, Spinal mobilization, Vestibular training, Cryotherapy, and Moist heat.  PLAN FOR NEXT SESSION: work on functional mobility, single limb activity, balance, hip and LE  flexibility   Lorrene Reid, PT 06/09/23 11:37 AM   [facility] [address] [place], [place] [address] Phone # [phone] Fax [phone]

## 2023-06-09 ENCOUNTER — Ambulatory Visit: Payer: Medicare HMO | Attending: Hematology and Oncology

## 2023-06-09 ENCOUNTER — Other Ambulatory Visit: Payer: Self-pay

## 2023-06-09 DIAGNOSIS — M79605 Pain in left leg: Secondary | ICD-10-CM | POA: Diagnosis not present

## 2023-06-09 DIAGNOSIS — M6281 Muscle weakness (generalized): Secondary | ICD-10-CM | POA: Insufficient documentation

## 2023-06-09 DIAGNOSIS — R252 Cramp and spasm: Secondary | ICD-10-CM | POA: Insufficient documentation

## 2023-06-09 DIAGNOSIS — Z170 Estrogen receptor positive status [ER+]: Secondary | ICD-10-CM | POA: Insufficient documentation

## 2023-06-09 DIAGNOSIS — M8580 Other specified disorders of bone density and structure, unspecified site: Secondary | ICD-10-CM | POA: Insufficient documentation

## 2023-06-09 DIAGNOSIS — M79604 Pain in right leg: Secondary | ICD-10-CM | POA: Diagnosis not present

## 2023-06-09 DIAGNOSIS — R2689 Other abnormalities of gait and mobility: Secondary | ICD-10-CM | POA: Diagnosis not present

## 2023-06-09 DIAGNOSIS — C50412 Malignant neoplasm of upper-outer quadrant of left female breast: Secondary | ICD-10-CM | POA: Diagnosis not present

## 2023-06-10 DIAGNOSIS — M1712 Unilateral primary osteoarthritis, left knee: Secondary | ICD-10-CM | POA: Diagnosis not present

## 2023-06-10 DIAGNOSIS — M25562 Pain in left knee: Secondary | ICD-10-CM | POA: Diagnosis not present

## 2023-06-19 ENCOUNTER — Encounter: Payer: Self-pay | Admitting: Family Medicine

## 2023-06-20 ENCOUNTER — Other Ambulatory Visit: Payer: Self-pay | Admitting: Family Medicine

## 2023-06-20 DIAGNOSIS — G43709 Chronic migraine without aura, not intractable, without status migrainosus: Secondary | ICD-10-CM

## 2023-06-20 MED ORDER — SUMATRIPTAN SUCCINATE 100 MG PO TABS
ORAL_TABLET | ORAL | 12 refills | Status: DC
Start: 2023-06-20 — End: 2024-01-06

## 2023-06-21 ENCOUNTER — Telehealth: Payer: Self-pay | Admitting: Family Medicine

## 2023-06-21 NOTE — Telephone Encounter (Signed)
 Nurtec samples given to pt

## 2023-06-21 NOTE — Telephone Encounter (Signed)
 Copied from CRM [id number]. Topic: Clinical - Prescription Issue >> Jun 21, 2023 10:34 AM Elle L wrote: Reason for CRM: The patient is requesting a different SUMAtriptan (IMITREX) 100 MG tablet medication be called in as she has finished her prescription and her insurance will not cover the one sent in yesterday. The patient's call back number is [phone] if needed.  Preferred Pharmacy:  [facility] [phone] - [place], [place] - [address] [place], [place] [address] Phone: [phone]  Fax: [phone]

## 2023-06-21 NOTE — Telephone Encounter (Signed)
 See if she just wants to come by and grab some nurtec samples?

## 2023-06-22 ENCOUNTER — Ambulatory Visit

## 2023-07-06 ENCOUNTER — Encounter

## 2023-07-06 ENCOUNTER — Encounter: Payer: Self-pay | Admitting: Family Medicine

## 2023-07-06 DIAGNOSIS — G43809 Other migraine, not intractable, without status migrainosus: Secondary | ICD-10-CM

## 2023-07-06 MED ORDER — EMGALITY 120 MG/ML ~~LOC~~ SOAJ: 120.0000 mg | SUBCUTANEOUS | 11 refills | Status: DC

## 2023-07-13 ENCOUNTER — Encounter

## 2023-07-20 ENCOUNTER — Encounter

## 2023-07-27 ENCOUNTER — Encounter

## 2023-08-02 ENCOUNTER — Encounter: Payer: Self-pay | Admitting: Family Medicine

## 2023-08-03 ENCOUNTER — Encounter

## 2023-08-05 ENCOUNTER — Other Ambulatory Visit: Payer: Self-pay | Admitting: Hematology and Oncology

## 2023-08-05 DIAGNOSIS — Z9889 Other specified postprocedural states: Secondary | ICD-10-CM

## 2023-08-09 ENCOUNTER — Telehealth: Payer: Self-pay

## 2023-08-09 NOTE — Progress Notes (Signed)
 Pharmacy Medication Assistance Program Note    09/07/2023  Patient ID: Carolyn Cooper, female   DOB: 1956-01-21, 68 y.o.   MRN: [id number]     08/09/2023  Outreach Medication One  Manufacturer Medication One Retail buyer Drugs Other [employer] [facility]  Type of Assistance Manufacturer Assistance  Date Application Sent to Patient 08/10/2023  Application Items Requested Application  Date Application Sent to Prescriber 09/07/2023  Date Application Received From Patient 09/06/2023  Application Items Received From Patient Application     RENEWAL - EMGALITY   Rec'd patient portion. Pcp portion emailed to [employer].

## 2023-08-10 ENCOUNTER — Encounter

## 2023-08-10 ENCOUNTER — Encounter: Payer: Self-pay | Admitting: Family Medicine

## 2023-08-24 ENCOUNTER — Encounter: Payer: Self-pay | Admitting: Family Medicine

## 2023-08-24 DIAGNOSIS — M1712 Unilateral primary osteoarthritis, left knee: Secondary | ICD-10-CM

## 2023-08-30 ENCOUNTER — Other Ambulatory Visit: Payer: Self-pay | Admitting: Hematology and Oncology

## 2023-09-03 ENCOUNTER — Encounter (INDEPENDENT_AMBULATORY_CARE_PROVIDER_SITE_OTHER): Payer: Self-pay | Admitting: Family Medicine

## 2023-09-03 DIAGNOSIS — E663 Overweight: Secondary | ICD-10-CM | POA: Diagnosis not present

## 2023-09-03 DIAGNOSIS — Z713 Dietary counseling and surveillance: Secondary | ICD-10-CM

## 2023-09-03 DIAGNOSIS — E7800 Pure hypercholesterolemia, unspecified: Secondary | ICD-10-CM

## 2023-09-06 DIAGNOSIS — L821 Other seborrheic keratosis: Secondary | ICD-10-CM | POA: Diagnosis not present

## 2023-09-13 ENCOUNTER — Other Ambulatory Visit (HOSPITAL_COMMUNITY): Payer: Self-pay

## 2023-09-13 NOTE — Progress Notes (Unsigned)
 Pharmacy Medication Assistance Program Note    09/15/2023  Patient ID: Carolyn Cooper, female   DOB: 1956-02-21, 68 y.o.   MRN: [id number]     08/09/2023  Outreach Medication One  Manufacturer Medication One Retail buyer Drugs Other [employer] [facility]  Type of Assistance Manufacturer Assistance  Date Application Sent to Patient 08/10/2023  Application Items Requested Application  Date Application Sent to Prescriber 09/07/2023  Date Application Received From Patient 09/06/2023  Application Items Received From Patient Application  Date Application Received From Provider 09/08/2023  Date Application Submitted to Manufacturer 09/13/2023  Method Application Sent to Manufacturer Fax  Patient Assistance Determination Approved  Approval Start Date 09/14/2023  Approval End Date 04/04/2024    RENEWAL APPROVED

## 2023-09-15 ENCOUNTER — Telehealth: Payer: Self-pay

## 2023-09-15 ENCOUNTER — Encounter: Payer: Self-pay | Admitting: Family Medicine

## 2023-09-15 ENCOUNTER — Other Ambulatory Visit (HOSPITAL_COMMUNITY): Payer: Self-pay

## 2023-09-15 MED ORDER — TIRZEPATIDE 2.5 MG/0.5ML ~~LOC~~ SOAJ
2.5000 mg | SUBCUTANEOUS | 0 refills | Status: DC
Start: 2023-09-15 — End: 2023-11-11

## 2023-09-15 MED ORDER — TIRZEPATIDE 5 MG/0.5ML ~~LOC~~ SOAJ
5.0000 mg | SUBCUTANEOUS | 0 refills | Status: DC
Start: 2023-09-15 — End: 2023-11-11

## 2023-09-15 NOTE — Telephone Encounter (Signed)
 Ozempic/Mounjaro is approved exclusively as an adjunct to diet and exercise to improve glycemic  control in adults with type 2 diabetes mellitus. A review of patient's medical chart reveals no  documented diagnosis of type 2 diabetes or an A1C indicative of diabetes. Therefore, they do not  currently meet the criteria for prior authorization of this medication. If clinically appropriate, alternative  options such as Saxenda, Zepbound, or Frederik Jansky may be considered for this patient.   Ran Customer service manager for [employer]. Patient has plan benefit exclusion for weight loss drugs, only covered for cardiovascular risk

## 2023-09-15 NOTE — Telephone Encounter (Signed)
 Please see the MyChart message reply(ies) for my assessment and plan.    This patient gave consent for this Medical Advice Message and is aware that it may result in a bill to [employer], as well as the possibility of receiving a bill for a co-payment or deductible. They are an established patient, but are not seeking medical advice exclusively about a problem treated during an in person or video visit in the last seven days. I did not recommend an in person or video visit within seven days of my reply.    I spent a total of 10 minutes cumulative time within 7 days through [employer].  Delynn Flavin, DO

## 2023-09-15 NOTE — Telephone Encounter (Signed)
Patient has been messaged

## 2023-09-21 ENCOUNTER — Encounter: Payer: Medicare HMO | Admitting: Family Medicine

## 2023-10-02 ENCOUNTER — Other Ambulatory Visit: Payer: Self-pay | Admitting: Family Medicine

## 2023-10-02 ENCOUNTER — Other Ambulatory Visit: Payer: Self-pay | Admitting: Physician Assistant

## 2023-10-02 DIAGNOSIS — F419 Anxiety disorder, unspecified: Secondary | ICD-10-CM

## 2023-10-03 ENCOUNTER — Other Ambulatory Visit (HOSPITAL_COMMUNITY): Payer: Self-pay

## 2023-10-05 ENCOUNTER — Ambulatory Visit
Admission: RE | Admit: 2023-10-05 | Discharge: 2023-10-05 | Disposition: A | Discharge status: Home / Self Care | Source: Ambulatory Visit | Attending: Hematology and Oncology | Admitting: Hematology and Oncology

## 2023-10-05 ENCOUNTER — Telehealth: Payer: Self-pay

## 2023-10-05 DIAGNOSIS — Z9889 Other specified postprocedural states: Secondary | ICD-10-CM

## 2023-10-05 DIAGNOSIS — Z853 Personal history of malignant neoplasm of breast: Secondary | ICD-10-CM | POA: Diagnosis not present

## 2023-10-05 DIAGNOSIS — Z08 Encounter for follow-up examination after completed treatment for malignant neoplasm: Secondary | ICD-10-CM | POA: Diagnosis not present

## 2023-10-05 NOTE — Telephone Encounter (Signed)
 Pt verbally confirmed appt for 7/3

## 2023-10-06 ENCOUNTER — Inpatient Hospital Stay: Payer: Medicare HMO | Attending: Hematology and Oncology | Admitting: Hematology and Oncology

## 2023-10-06 ENCOUNTER — Inpatient Hospital Stay

## 2023-10-06 VITALS — BP 138/79 | HR 76 | Temp 97.7°F | Resp 18 | Ht 65.5 in

## 2023-10-06 DIAGNOSIS — Z79811 Long term (current) use of aromatase inhibitors: Secondary | ICD-10-CM | POA: Diagnosis not present

## 2023-10-06 DIAGNOSIS — M8580 Other specified disorders of bone density and structure, unspecified site: Secondary | ICD-10-CM | POA: Insufficient documentation

## 2023-10-06 DIAGNOSIS — R296 Repeated falls: Secondary | ICD-10-CM | POA: Insufficient documentation

## 2023-10-06 DIAGNOSIS — Z170 Estrogen receptor positive status [ER+]: Secondary | ICD-10-CM | POA: Diagnosis not present

## 2023-10-06 DIAGNOSIS — C50412 Malignant neoplasm of upper-outer quadrant of left female breast: Secondary | ICD-10-CM | POA: Insufficient documentation

## 2023-10-06 LAB — CMP (CANCER CENTER ONLY)
ALT: 12 U/L (ref 0–44)
AST: 16 U/L (ref 15–41)
Albumin: 4.3 g/dL (ref 3.5–5.0)
Alkaline Phosphatase: 101 U/L (ref 38–126)
Anion gap: 5 (ref 5–15)
BUN: 14 mg/dL (ref 8–23)
CO2: 32 mmol/L (ref 22–32)
Calcium: 9.2 mg/dL (ref 8.9–10.3)
Chloride: 105 mmol/L (ref 98–111)
Creatinine: 0.82 mg/dL (ref 0.44–1.00)
GFR, Estimated: >60 mL/min (ref >60)
Glucose, Bld: 89 mg/dL (ref 70–99)
Potassium: 3.8 mmol/L (ref 3.5–5.1)
Sodium: 142 mmol/L (ref 135–145)
Total Bilirubin: 0.4 mg/dL (ref 0.0–1.2)
Total Protein: 6.9 g/dL (ref 6.5–8.1)

## 2023-10-06 LAB — CBC WITH DIFFERENTIAL/PLATELET
Abs Immature Granulocytes: 0.02 10*3/uL (ref 0.00–0.07)
Basophils Absolute: 0.1 10*3/uL (ref 0.0–0.1)
Basophils Relative: 1 %
Eosinophils Absolute: 0.1 10*3/uL (ref 0.0–0.5)
Eosinophils Relative: 2 %
HCT: 37.9 % (ref 36.0–46.0)
Hemoglobin: 13 g/dL (ref 12.0–15.0)
Immature Granulocytes: 0 %
Lymphocytes Relative: 18 %
Lymphs Abs: 1 10*3/uL (ref 0.7–4.0)
MCH: 32.8 pg (ref 26.0–34.0)
MCHC: 34.3 g/dL (ref 30.0–36.0)
MCV: 95.7 fL (ref 80.0–100.0)
Monocytes Absolute: 0.4 10*3/uL (ref 0.1–1.0)
Monocytes Relative: 8 %
Neutro Abs: 4 10*3/uL (ref 1.7–7.7)
Neutrophils Relative %: 71 %
Platelets: 243 10*3/uL (ref 150–400)
RBC: 3.96 MIL/uL (ref 3.87–5.11)
RDW: 11.9 % (ref 11.5–15.5)
WBC: 5.7 10*3/uL (ref 4.0–10.5)
nRBC: 0 % (ref 0.0–0.2)

## 2023-10-06 MED ORDER — ANASTROZOLE 1 MG PO TABS: 1.0000 mg | ORAL_TABLET | Freq: Every day | ORAL | 3 refills | Status: AC

## 2023-10-06 NOTE — Progress Notes (Signed)
 [facility]  [phone] [phone]     ID: Carolyn Cooper DOB: [birth year]  MR#: [id number]  RDW#:[id number]  Patient Care Team: Jolinda Norene HERO, DO as PCP - General (Family Medicine) Glean Stephane BROCKS, RN (Inactive) as Oncology Nurse Navigator Tyree Nanetta SAILOR, RN as Oncology Nurse Navigator Dewey Rush, MD as Consulting Physician (Radiation Oncology) Vanderbilt Ned, MD as Consulting Physician (General Surgery) Prentiss Annabella LABOR, NP as Nurse Practitioner (Gynecology) Shila Gustav GAILS, MD as Consulting Physician (Gastroenterology) Livingston Glean, MD (Inactive) as Consulting Physician (Dermatology) Loretha Ash, MD as Consulting Physician (Hematology and Oncology) Ash Loretha, MD OTHER MD:   CHIEF COMPLAINT: Invasive lobular breast cancer  CURRENT TREATMENT: anastrozole   INTERVAL HISTORY: Carolyn Cooper returns today for follow up of her invasive lobular breast cancer.   Discussed the use of AI scribe software for clinical note transcription with the patient, who gave verbal consent to proceed.  History of Present Illness Carolyn Cooper is a 68 year old female with osteopenia and a history of breast cancer who presents for a follow-up visit. Her husband is present during the visit.  She did not start Mounjaro  for weight loss due to insurance issues, despite her interest in losing approximately twenty pounds. Her body mass index is around twenty-five. She previously lost forty pounds through a [employer] loss program three years ago.  Her osteopenia was identified by her family practice. She recalls a conversation about a shot, possibly Zometa, that is administered every year or six months. Her last bone density scan was in January 2024, showing osteopenia but not osteoporosis. She is currently taking calcium supplements.  She experiences joint pain, particularly in the gluteal region, which she attributes to  anastrozole . Walking exacerbates the pain, causing a burning sensation. However, she notes improvement after taking collagen supplements, especially when climbing stairs.  She has a history of breast cancer and started anastrozole  in February 2022.  Her recent mammogram was normal, though her breasts remain dense. No changes in her breasts.  She reports falling four times in the past year, attributing the incidents to tripping or wearing inappropriate footwear like flip-flops and wedges. No issues with hearing or vision, but she plans to have an eye test soon.  She mentions increased underarm odor since starting anastrozole , particularly on the side where she received radiation therapy. She has tried various deodorants without success.   Rest of the pertinent 10 point ROS reviewed and negative   COVID 19 VACCINATION STATUS: Status post 2 doses, booster pending as of January 2022   HISTORY OF CURRENT ILLNESS: From the original intake note:  Carolyn Cooper had routine screening mammography on 01/02/2020 showing a possible abnormality in the left breast. She underwent left diagnostic mammography with tomography and left breast ultrasonography at [facility] on 01/15/2020 showing: breast density category C; suspicious 3 mm mass in left breast at 2:30; no left lymphadenopathy.  Accordingly on 01/28/2020 she proceeded to biopsy of the left breast area in question. The pathology from this procedure ([id number]) showed: invasive and in situ mammary carcinoma, e-cadherin negative, grade 2. Prognostic indicators significant for: estrogen receptor, >95% positive with strong staining intensity and progesterone receptor, 20% positive with moderate staining intensity. Proliferation marker Ki67 at 10%. HER2 equivocal by immunohistochemistry (2+), but negative by fluorescent in situ hybridization with a signals ratio 1.52 and number per cell 3.5.  The patient's subsequent history is as detailed below.   PAST  MEDICAL HISTORY: Past Medical History:  Diagnosis  Date   Allergy    seasonal   Anxiety    Barrett's esophagus    Breast cancer (HCC) 01/2020   left breast ILC   Cataract    had surgery   Depression    Family history of breast cancer    Family history of kidney cancer    Family history of ovarian cancer    Family history of stomach cancer    GERD (gastroesophageal reflux disease)    IBS (irritable bowel syndrome)    Migraines    Osteopenia 2019   T score -1.5 FRAX 8% / 0.7%   Personal history of radiation therapy     PAST SURGICAL HISTORY: Past Surgical History:  Procedure Laterality Date   BREAST LUMPECTOMY Left 2021   BREAST LUMPECTOMY WITH RADIOACTIVE SEED AND SENTINEL LYMPH NODE BIOPSY Left 02/14/2020   Procedure: LEFT BREAST LUMPECTOMY WITH RADIOACTIVE SEED AND SENTINEL LYMPH NODE MAPPING;  Surgeon: Vanderbilt Ned, MD;  Location: [facility];  Service: General;  Laterality: Left;  PECTORAL BLOCK   CATARACT EXTRACTION, BILATERAL  2018   CERVICAL CONE BIOPSY  1996   COLONOSCOPY  07/21/2007   CYSTOSCOPY     MOUTH SURGERY  1978   WISDOM TEETH    FAMILY HISTORY: Family History  Problem Relation Age of Onset   Diverticulitis Mother    Heart disease Father    Kidney cancer Father 67   Ovarian cancer Maternal Grandmother 12   Stomach cancer Maternal Grandfather        dx 48s   Breast cancer Other        dx >50, maternal great-aunt   Colon cancer Neg Hx    Esophageal cancer Neg Hx    Rectal cancer Neg Hx    Her parents are living as of 02/2020-- her father at age 40 and her mother at age 10. Carolyn Cooper has one sister (and no brothers). She notes her mother had previously undergone genetic testing for BRCA 1 and 2, and she was negative. She reports ovarian cancer in her maternal grandmother at age 66, stomach cancer in her maternal grandfather in his 73's, and kidney cancer in her dad at age 72.   GYNECOLOGIC HISTORY:  No LMP recorded. Patient is  postmenopausal. Menarche: 68 years old Age at first live birth: 68 years old GX P 4 LMP 2015? Contraceptive: used pretty much from 69 on with no issues HRT used briefly, stopped about 5 years ago  Hysterectomy? no BSO? 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 Husband Elspeth works for [employer]. Daughter Nat Cousin, age 55, is a Veterinary surgeon here in [place]. Son Sherlean Drown, age 29, is unemployed. Daughter Romualdo Lunger, age 101, is a homemaker here in [place]. Son Donnice Drown, age 73, works in Arts development officer in [place]. Carolyn Cooper has 4 grandchildren. She attends a [employer].    ADVANCED DIRECTIVES: In the absence of any documentation to the contrary, the patient's spouse is their HCPOA.    HEALTH MAINTENANCE: Social History   Tobacco Use   Smoking status: Never   Smokeless tobacco: Never  Vaping Use   Vaping status: Never Used  Substance Use Topics   Alcohol use: Not Currently    Alcohol/week: 0.0 standard drinks of alcohol   Drug use: No     Colonoscopy: 10/2017 (Dr. Shila), repeat 2024  PAP: 01/2018, negative  Bone density: 05/2017, -1.5   Allergies  Allergen Reactions  Sulfa Antibiotics Hives    Current Outpatient Medications  Medication Sig Dispense Refill   anastrozole  (ARIMIDEX ) 1 MG tablet TAKE 1 TABLET DAILY 90 tablet 3   Calcium Citrate-Vitamin D  (CALCIUM + D PO) Take by mouth daily. 1200 - 1000 mg tablet     celecoxib (CELEBREX) 200 MG capsule Take 200 mg by mouth 2 (two) times daily as needed.     escitalopram  (LEXAPRO ) 20 MG tablet TAKE 1 TABLET BY MOUTH EVERY DAY 90 tablet 0   Galcanezumab -gnlm (EMGALITY ) 120 MG/ML SOAJ Inject 120 mg into the skin every 30 (thirty) days. 3 mL 11   hydrocortisone -pramoxine (ANALPRAM HC) 2.5-1 % rectal cream Place 1 Application rectally 2 (two) times daily. Use Analpram HC cream 2.5%; Apply externally twice daily for 5  days. 10 g 0   omeprazole  (PRILOSEC) 40 MG capsule TAKE 1 CAPSULE (40 MG TOTAL) BY MOUTH DAILY. 90 capsule 1   SUMAtriptan  (IMITREX ) 100 MG tablet May repeat in 2 hours if headache persists or recurs. 9 tablet 12   tirzepatide  (MOUNJARO ) 2.5 MG/0.5ML Pen Inject 2.5 mg into the skin once a week. Per patient, her insurance said send mounjaro  for weight loss. 2 mL 0   tirzepatide  (MOUNJARO ) 5 MG/0.5ML Pen Inject 5 mg into the skin once a week. 6 mL 0   No current facility-administered medications for this visit.    OBJECTIVE: [place] Cooper in no acute distress  There were no vitals filed for this visit.     There is no height or weight on file to calculate BMI.   Wt Readings from Last 3 Encounters:  05/11/23 162 lb (73.5 kg)  11/23/22 152 lb (68.9 kg)  11/02/22 152 lb (68.9 kg)      ECOG FS:1 - Symptomatic but completely ambulatory  Physical Exam Constitutional:      Appearance: Normal appearance.  Cardiovascular:     Rate and Rhythm: Normal rate and regular rhythm.     Pulses: Normal pulses.     Heart sounds: Normal heart sounds.  Pulmonary:     Effort: Pulmonary effort is normal.     Breath sounds: Normal breath sounds.  Chest:     Comments: Bilateral breasts inspected.  No palpable masses or regional adenopathy Musculoskeletal:        General: No swelling or tenderness.     Cervical back: Normal range of motion. No rigidity.  Lymphadenopathy:     Cervical: No cervical adenopathy.  Skin:    General: Skin is warm and dry.  Neurological:     General: No focal deficit present.     Mental Status: She is alert.  Psychiatric:        Mood and Affect: Mood normal.     LAB RESULTS:  CMP     Component Value Date/Time   NA 140 10/05/2022 0805   NA 141 07/27/2021 0841   K 3.5 10/05/2022 0805   CL 104 10/05/2022 0805   CO2 30 10/05/2022 0805   GLUCOSE 84 10/05/2022 0805   BUN 13 10/05/2022 0805   BUN 9 07/27/2021 0841   CREATININE 0.79 10/05/2022 0805   CREATININE 0.79  10/11/2012 1527   CALCIUM 8.9 10/05/2022 0805   PROT 6.6 10/05/2022 0805   PROT 6.6 07/27/2021 0841   ALBUMIN 4.1 10/05/2022 0805   ALBUMIN 4.4 07/27/2021 0841   AST 16 10/05/2022 0805   ALT 13 10/05/2022 0805   ALKPHOS 88 10/05/2022 0805   BILITOT 0.6 10/05/2022 0805   GFRNONAA >60 10/05/2022  0805   GFRAA >60 07/09/2019 0308    No results found for: TOTALPROTELP, ALBUMINELP, A1GS, A2GS, BETS, BETA2SER, GAMS, MSPIKE, SPEI  Lab Results  Component Value Date   WBC 6.1 10/05/2022   NEUTROABS 4.5 10/05/2022   HGB 13.3 10/05/2022   HCT 39.8 10/05/2022   MCV 100.5 (H) 10/05/2022   PLT 261 10/05/2022    No results found for: LABCA2  No components found for: [id number]  No results for input(s): INR in the last 168 hours.  No results found for: LABCA2  No results found for: RJW800  No results found for: CAN125  No results found for: CAN153  No results found for: CA2729  No components found for: HGQUANT  No results found for: CEA1, CEA / No results found for: CEA1, CEA   No results found for: AFPTUMOR  No results found for: CHROMOGRNA  No results found for: KPAFRELGTCHN, LAMBDASER, KAPLAMBRATIO (kappa/lambda light chains)  No results found for: HGBA, HGBA2QUANT, HGBFQUANT, HGBSQUAN (Hemoglobinopathy evaluation)   No results found for: LDH  Lab Results  Component Value Date   IRON 109 07/13/2022   TIBC 227 (L) 07/13/2022   IRONPCTSAT 48 07/13/2022   (Iron and TIBC)  Lab Results  Component Value Date   FERRITIN 159 (H) 07/13/2022    Urinalysis    Component Value Date/Time   COLORURINE YELLOW 12/19/2015 1217   APPEARANCEUR Clear 10/18/2016 0859   LABSPEC 1.001 12/19/2015 1217   PHURINE 6.0 12/19/2015 1217   GLUCOSEU Negative 10/18/2016 0859   HGBUR NEGATIVE 12/19/2015 1217   BILIRUBINUR Negative 10/18/2016 0859   KETONESUR NEGATIVE 12/19/2015 1217   PROTEINUR Negative 10/18/2016 0859    PROTEINUR NEGATIVE 12/19/2015 1217   UROBILINOGEN 0.2 10/12/2013 1545   NITRITE Negative 10/18/2016 0859   NITRITE NEGATIVE 12/19/2015 1217   LEUKOCYTESUR 1+ (A) 10/18/2016 0859    STUDIES: MM 3D DIAGNOSTIC MAMMOGRAM BILATERAL BREAST Result Date: 10/05/2023 CLINICAL DATA:  Post lumpectomy annual surveillance. Patient underwent a left lumpectomy for breast carcinoma 2021, treated with adjuvant radiation therapy. No current breast complaints. EXAM: DIGITAL DIAGNOSTIC BILATERAL MAMMOGRAM WITH TOMOSYNTHESIS AND CAD TECHNIQUE: Bilateral digital diagnostic mammography and breast tomosynthesis was performed. The images were evaluated with computer-aided detection. COMPARISON:  Previous exam(s). ACR Breast Density Category c: The breasts are heterogeneously dense, which may obscure small masses. FINDINGS: Post lumpectomy changes in the posterior, lateral left breast are stable. There are no breast masses, areas of nonsurgical architectural distortion, areas of significant asymmetry or new or suspicious calcifications. IMPRESSION: 1. No evidence of new or recurrent breast malignancy. 2. Benign post lumpectomy changes on the left. RECOMMENDATION: Screening mammogram in one year.(Code:SM-B-01Y) I have discussed the findings and recommendations with the patient. If applicable, a reminder letter will be sent to the patient regarding the next appointment. BI-RADS CATEGORY  2: Benign. Electronically Signed   By: Alm Parkins M.D.   On: 10/05/2023 10:09    ELIGIBLE FOR AVAILABLE RESEARCH PROTOCOL: AET  ASSESSMENT: 68 y.o. Carolyn Cooper status post left breast upper outer quadrant biopsy 01/28/2020 for a clinical T1a/b N0, stage IA invasive lobular carcinoma, E-cadherin negative, estrogen and progesterone receptor positive, HER-2 not amplified, with an MIB-1 of 10%.  (1) status post left lumpectomy and sentinel lymph node sampling 02/14/2020 for a pT1c pN0, stage IA invasive lobular carcinoma, grade 2, with negative  margins  (a) 2 left axillary lymph nodes were removed  (2) Oncotype score of 16 predicts a risk of recurrence outside the breast in the next 9  years of 4% if the patient's only systemic therapy is antiestrogens for 5 years.  It also predicts no benefit from chemotherapy  (3) adjuvant radiation: Radiation Treatment Dates: 04/08/2020 through 05/05/2020 Site Technique Total Dose (Gy) Dose per Fx (Gy) Completed Fx Beam Energies  Breast, Left: Breast_Lt 3D 42.56/42.56 2.66 16/16 6X, 10X  Breast, Left: Breast_Lt_Bst 3D 8/8 2 4/4 6X, 10X   (4) started anastrozole  05/19/2020  (a) bone density 07/07/2020 shows a T score of -1.4 (minimal osteopenia) mid June 2023, labs same day   PLAN:  Assessment and Plan Assessment & Plan Invasive Lobular breast cancer On anastrozole  since February 2022. Mammogram normal. Discussed potential extension of anastrozole  therapy beyond five years due to lobular histology.  - Continue anastrozole  therapy. - Consider extending anastrozole  therapy to seven years. - Order lab work, CBC, CMP - Send anastrozole  prescription to [facility].  Osteopenia Confirmed by bone density scan. Not osteoporotic. Discussed potential progression to osteoporosis due to anastrozole  use. Current dental implant procedure contraindicates bisphosphonates due to osteonecrosis risk. - Continue calcium supplementation. - Encourage weight-bearing exercises such as walking. - Reassess bone density in the future, especially if anastrozole  continues.  Gluteal tendinopathy Causing burning pain with walking. Symptoms improved with collagen supplements. - Encourage gradual increase in walking, starting with 15 minutes at a time.  Falls Multiple falls possibly related to footwear and balance issues. No significant vision or hearing problems reported. Explained balance relies on joint position sense, hearing, and vision. - Recommend wearing comfortable shoes with good grip. -  Schedule eye test to rule out vision issues contributing to falls.  RTC in 1 yr for follow up.  Total time spent: 40 min  *Total Encounter Time as defined by the Centers for Medicare and Medicaid Services includes, in addition to the face-to-face time of a patient visit (documented in the note above) non-face-to-face time: obtaining and reviewing outside history, ordering and reviewing medications, tests or procedures, care coordination (communications with other health care professionals or caregivers) and documentation in the medical record.

## 2023-10-31 ENCOUNTER — Encounter: Payer: Self-pay | Admitting: Hematology and Oncology

## 2023-11-01 ENCOUNTER — Telehealth: Payer: Self-pay | Admitting: *Deleted

## 2023-11-01 NOTE — Telephone Encounter (Signed)
 This RN spoke with pt per my chart message regarding onset of rash post use of Alra deodorant use post completing radiation which is now causing a rash.  She states she used the same type of deodorant during radiation without any issues but recently reinitiated use with onset of rash under left axillary.  She stopped using the Alra and rash went away.  She then reused it and rash returned.  She has now stopped using the Alra with resolution of symptoms and is not having any rash or itching at present.  This RN discussed above - with recommendation for use of Tom's brand with note for the non aluminum  form.  This RN also asked pt to monitor area and to call if any changes recur.  This note will be forwarded to provider for review of issue for any further recommendations.  Pt verbalized understanding

## 2023-11-10 DIAGNOSIS — Z961 Presence of intraocular lens: Secondary | ICD-10-CM | POA: Diagnosis not present

## 2023-11-10 DIAGNOSIS — H524 Presbyopia: Secondary | ICD-10-CM | POA: Diagnosis not present

## 2023-11-11 ENCOUNTER — Ambulatory Visit: Admitting: Family Medicine

## 2023-11-11 NOTE — Progress Notes (Deleted)
 Carolyn Cooper is a 68 y.o. female presents to office today for annual physical exam examination.    Concerns today include: 1. ***  Occupation: ***, Marital status: ***, Substance use: *** Health Maintenance Due  Topic Date Due   COVID-19 Vaccine (3 - Moderna risk series) 06/08/2019   Medicare Annual Wellness (AWV)  08/31/2023   INFLUENZA VACCINE  11/04/2023   Refills needed today: ***  Immunization History  Administered Date(s) Administered   Fluad Quad(high Dose 65+) 01/04/2022   Influenza Split 01/16/2013   Influenza,inj,Quad PF,6+ Mos 01/14/2014, 01/14/2015, 01/05/2016, 01/03/2017, 12/30/2017, 12/28/2018, 12/29/2018, 01/24/2020   Influenza-Unspecified 01/14/2014, 01/14/2015, 01/05/2016, 01/13/2021, 01/26/2021   MMR 08/13/2009   Moderna Sars-Covid-2 Vaccination 04/06/2019, 05/11/2019   PNEUMOCOCCAL CONJUGATE-20 01/19/2022   Td 08/04/2009   Tdap 08/04/2009, 04/10/2019   Zoster Recombinant(Shingrix ) 12/01/2016, 07/08/2017   Zoster, Live 01/29/2015   Past Medical History:  Diagnosis Date   Allergy    seasonal   Anxiety    Barrett's esophagus    Breast cancer (HCC) 01/2020   left breast ILC   Cataract    had surgery   Depression    Family history of breast cancer    Family history of kidney cancer    Family history of ovarian cancer    Family history of stomach cancer    GERD (gastroesophageal reflux disease)    IBS (irritable bowel syndrome)    Migraines    Osteopenia 2019   T score -1.5 FRAX 8% / 0.7%   Personal history of radiation therapy    Social History   Socioeconomic History   Marital status: Married    Spouse name: Not on file   Number of children: Not on file   Years of education: Not on file   Highest education level: Associate degree: occupational, Scientist, product/process development, or vocational program  Occupational History   Not on file  Tobacco Use   Smoking status: Never   Smokeless tobacco: Never  Vaping Use   Vaping status: Never Used  Substance  and Sexual Activity   Alcohol use: Not Currently    Alcohol/week: 0.0 standard drinks of alcohol   Drug use: No   Sexual activity: Not Currently    Partners: Male    Birth control/protection: Post-menopausal    Comment: 1st intercourse 68 yo-Fewer than 5 partners (husband diagnosed with multiple myeloma)  Other Topics Concern   Not on file  Social History Narrative   Not on file   Social Drivers of Health   Financial Resource Strain: Low Risk  (11/08/2023)   Overall Financial Resource Strain (CARDIA)    Difficulty of Paying Living Expenses: Not hard at all  Food Insecurity: No Food Insecurity (11/08/2023)   Hunger Vital Sign    Worried About Running Out of Food in the Last Year: Never true    Ran Out of Food in the Last Year: Never true  Transportation Needs: No Transportation Needs (11/08/2023)   PRAPARE - Administrator, Civil Service (Medical): No    Lack of Transportation (Non-Medical): No  Physical Activity: Inactive (11/08/2023)   Exercise Vital Sign    Days of Exercise per Week: 0 days    Minutes of Exercise per Session: Not on file  Stress: No Stress Concern Present (11/08/2023)   [employer] of Occupational Health - Occupational Stress Questionnaire    Feeling of Stress: Not at all  Social Connections: Socially Integrated (11/08/2023)   Social Connection and Isolation Panel    Frequency of  Communication with Friends and Family: Three times a week    Frequency of Social Gatherings with Friends and Family: Once a week    Attends Religious Services: More than 4 times per year    Active Member of [employer] or Organizations: Yes    Attends Engineer, structural: More than 4 times per year    Marital Status: Married  Catering manager Violence: Not At Risk (08/31/2022)   Humiliation, Afraid, Rape, and Kick questionnaire    Fear of Current or Ex-Partner: No    Emotionally Abused: No    Physically Abused: No    Sexually Abused: No   Past Surgical History:   Procedure Laterality Date   BREAST LUMPECTOMY Left 2021   BREAST LUMPECTOMY WITH RADIOACTIVE SEED AND SENTINEL LYMPH NODE BIOPSY Left 02/14/2020   Procedure: LEFT BREAST LUMPECTOMY WITH RADIOACTIVE SEED AND SENTINEL LYMPH NODE MAPPING;  Surgeon: Vanderbilt Ned, MD;  Location: [facility];  Service: General;  Laterality: Left;  PECTORAL BLOCK   CATARACT EXTRACTION, BILATERAL  2018   CERVICAL CONE BIOPSY  1996   COLONOSCOPY  07/21/2007   CYSTOSCOPY     MOUTH SURGERY  1978   WISDOM TEETH   Family History  Problem Relation Age of Onset   Diverticulitis Mother    Heart disease Father    Kidney cancer Father 93   Ovarian cancer Maternal Grandmother 57   Stomach cancer Maternal Grandfather        dx 25s   Breast cancer Other        dx >50, maternal great-aunt   Colon cancer Neg Hx    Esophageal cancer Neg Hx    Rectal cancer Neg Hx     Current Outpatient Medications:    anastrozole  (ARIMIDEX ) 1 MG tablet, Take 1 tablet (1 mg total) by mouth daily., Disp: 90 tablet, Rfl: 3   Calcium Citrate-Vitamin D  (CALCIUM + D PO), Take by mouth daily. 1200 - 1000 mg tablet, Disp: , Rfl:    escitalopram  (LEXAPRO ) 20 MG tablet, TAKE 1 TABLET BY MOUTH EVERY DAY, Disp: 90 tablet, Rfl: 0   Galcanezumab -gnlm (EMGALITY ) 120 MG/ML SOAJ, Inject 120 mg into the skin every 30 (thirty) days., Disp: 3 mL, Rfl: 11   omeprazole  (PRILOSEC) 40 MG capsule, TAKE 1 CAPSULE (40 MG TOTAL) BY MOUTH DAILY., Disp: 90 capsule, Rfl: 1   SUMAtriptan  (IMITREX ) 100 MG tablet, May repeat in 2 hours if headache persists or recurs., Disp: 9 tablet, Rfl: 12  Allergies  Allergen Reactions   Sulfa Antibiotics Hives     ROS: Review of Systems {ros; complete:30496}    Physical exam {Exam, [phone]}      03/08/2023    3:25 PM 08/31/2022    9:18 AM 07/13/2022   10:01 AM  Depression screen PHQ 2/9  Decreased Interest 0 0 0  Down, Depressed, Hopeless 0 0 0  PHQ - 2 Score 0 0 0  Altered sleeping 0 0 0   Tired, decreased energy 0 0 0  Change in appetite 0 0 0  Feeling bad or failure about yourself  0 0 0  Trouble concentrating 0 0 0  Moving slowly or fidgety/restless 0 0 0  Suicidal thoughts 0 0 0  PHQ-9 Score 0 0 0  Difficult doing work/chores Not difficult at all Not difficult at all Not difficult at all      03/08/2023    3:25 PM 07/13/2022   10:01 AM 01/19/2022    9:39 AM 07/27/2021  8:16 AM  GAD 7 : Generalized Anxiety Score  Nervous, Anxious, on Edge 0 0 0 0  Control/stop worrying 0 0 0 0  Worry too much - different things 0 0 0 0  Trouble relaxing 0 0 0 0  Restless 0 0 0 0  Easily annoyed or irritable 0 0 0 0  Afraid - awful might happen 0 0 0 0  Total GAD 7 Score 0 0 0 0  Anxiety Difficulty  Not difficult at all Not difficult at all Not difficult at all     Assessment/ Plan: Adrien Nelwyn Kiss here for annual physical exam.   Annual physical exam  Pure hypercholesterolemia  Vitamin D  deficiency  Osteopenia, unspecified location  Malignant neoplasm of upper-outer quadrant of left breast in female, estrogen receptor positive (HCC)  ***  Counseled on healthy lifestyle choices, including diet (rich in fruits, vegetables and lean meats and low in salt and simple carbohydrates) and exercise (at least 30 minutes of moderate physical activity daily).  Patient to follow up ***  Quention Mcneill M. Jolinda, DO

## 2023-11-17 ENCOUNTER — Ambulatory Visit (INDEPENDENT_AMBULATORY_CARE_PROVIDER_SITE_OTHER)

## 2023-11-17 VITALS — BP 138/79 | HR 78 | Ht 65.0 in | Wt 162.0 lb

## 2023-11-17 DIAGNOSIS — Z0000 Encounter for general adult medical examination without abnormal findings: Secondary | ICD-10-CM

## 2023-11-17 NOTE — Progress Notes (Signed)
 Subjective:   Carolyn Cooper is a 68 y.o. who presents for a Medicare Wellness preventive visit.  As a reminder, Annual Wellness Visits don't include a physical exam, and some assessments may be limited, especially if this visit is performed virtually. We may recommend an in-person follow-up visit with your provider if needed.  Visit Complete: Virtual I connected with  [name] on 11/17/23 by a audio enabled telemedicine application and verified that I am speaking with the correct person using two identifiers.  Patient Location: Home  Provider Location: Home Office  I discussed the limitations of evaluation and management by telemedicine. The patient expressed understanding and agreed to proceed.  Vital Signs: Because this visit was a virtual/telehealth visit, some criteria may be missing or patient reported. Any vitals not documented were not able to be obtained and vitals that have been documented are patient reported.  VideoDeclined- This patient declined Librarian, academic. Therefore the visit was completed with audio only.  Persons Participating in Visit: Patient.  AWV Questionnaire: No: Patient Medicare AWV questionnaire was not completed prior to this visit.  Cardiac Risk Factors include: advanced age (>89men, >31 women)     Objective:    Today's Vitals   11/17/23 0937  BP: 138/79  Pulse: 78  Weight: 162 lb (73.5 kg)  Height: 5' 5 (1.651 m)   Body mass index is 26.96 kg/m.     11/17/2023    9:27 AM 06/09/2023    9:36 AM 12/10/2022    4:17 PM 08/31/2022    9:19 AM 02/14/2020   11:54 AM 02/11/2020    3:36 PM 02/06/2020   10:50 AM  Advanced Directives  Does Patient Have a Medical Advance Directive? No Yes No Yes No Yes Yes  Type of Advance Directive  Living will  Healthcare Power of [place];Living will  Living will Living will  Does patient want to make changes to medical advance directive?  Yes (Inpatient - patient  requests chaplain consult to change a medical advance directive)   No - Guardian declined No - Patient declined No - Patient declined  Copy of Healthcare Power of Attorney in Chart?    No - copy requested     Would patient like information on creating a medical advance directive?   No - Patient declined  No - Guardian declined      Current Medications (verified) Outpatient Encounter Medications as of 11/17/2023  Medication Sig   anastrozole  (ARIMIDEX ) 1 MG tablet Take 1 tablet (1 mg total) by mouth daily.   Calcium Citrate-Vitamin D  (CALCIUM + D PO) Take by mouth daily. 1200 - 1000 mg tablet   escitalopram  (LEXAPRO ) 20 MG tablet TAKE 1 TABLET BY MOUTH EVERY DAY   Galcanezumab -gnlm (EMGALITY ) 120 MG/ML SOAJ Inject 120 mg into the skin every 30 (thirty) days.   omeprazole  (PRILOSEC) 40 MG capsule TAKE 1 CAPSULE (40 MG TOTAL) BY MOUTH DAILY.   SUMAtriptan  (IMITREX ) 100 MG tablet May repeat in 2 hours if headache persists or recurs.   No facility-administered encounter medications on file as of 11/17/2023.    Allergies (verified) Sulfa antibiotics   History: Past Medical History:  Diagnosis Date   Allergy    seasonal   Anxiety    Barrett's esophagus    Breast cancer (HCC) 01/2020   left breast ILC   Cataract    had surgery   Depression    Family history of breast cancer    Family history of kidney cancer  Family history of ovarian cancer    Family history of stomach cancer    GERD (gastroesophageal reflux disease)    IBS (irritable bowel syndrome)    Migraines    Osteopenia 2019   T score -1.5 FRAX 8% / 0.7%   Personal history of radiation therapy    Past Surgical History:  Procedure Laterality Date   BREAST LUMPECTOMY Left 2021   BREAST LUMPECTOMY WITH RADIOACTIVE SEED AND SENTINEL LYMPH NODE BIOPSY Left 02/14/2020   Procedure: LEFT BREAST LUMPECTOMY WITH RADIOACTIVE SEED AND SENTINEL LYMPH NODE MAPPING;  Surgeon: Vanderbilt Ned, MD;  Location: [facility];   Service: General;  Laterality: Left;  PECTORAL BLOCK   CATARACT EXTRACTION, BILATERAL  2018   CERVICAL CONE BIOPSY  1996   COLONOSCOPY  07/21/2007   CYSTOSCOPY     EYE SURGERY  Retina surgery bilateral and cataract bilat   MOUTH SURGERY  1978   WISDOM TEETH   Family History  Problem Relation Age of Onset   Diverticulitis Mother    Hypertension Mother    Heart disease Father    Kidney cancer Father 67   Cancer Father    Hypertension Father    Ovarian cancer Maternal Grandmother 68   Stomach cancer Maternal Grandfather        dx 80s   Breast cancer Other        dx >50, maternal great-aunt   ADD / ADHD Daughter    ADD / ADHD Son    Alcohol abuse Son    Depression Son    Drug abuse Son    Colon cancer Neg Hx    Esophageal cancer Neg Hx    Rectal cancer Neg Hx    Social History   Socioeconomic History   Marital status: Married    Spouse name: Not on file   Number of children: Not on file   Years of education: Not on file   Highest education level: Associate degree: occupational, Scientist, product/process development, or vocational program  Occupational History   Not on file  Tobacco Use   Smoking status: Never   Smokeless tobacco: Never  Vaping Use   Vaping status: Never Used  Substance and Sexual Activity   Alcohol use: Not Currently    Alcohol/week: 0.0 standard drinks of alcohol   Drug use: No   Sexual activity: Not Currently    Partners: Male    Birth control/protection: Post-menopausal    Comment: 1st intercourse 68 yo-Fewer than 5 partners (husband diagnosed with multiple myeloma)  Other Topics Concern   Not on file  Social History Narrative   Not on file   Social Drivers of Health   Financial Resource Strain: Low Risk  (11/17/2023)   Overall Financial Resource Strain (CARDIA)    Difficulty of Paying Living Expenses: Not hard at all  Food Insecurity: No Food Insecurity (11/17/2023)   Hunger Vital Sign    Worried About Running Out of Food in the Last Year: Never true    Ran Out  of Food in the Last Year: Never true  Transportation Needs: No Transportation Needs (11/17/2023)   PRAPARE - Administrator, Civil Service (Medical): No    Lack of Transportation (Non-Medical): No  Physical Activity: Inactive (11/17/2023)   Exercise Vital Sign    Days of Exercise per Week: 0 days    Minutes of Exercise per Session: 0 min  Stress: No Stress Concern Present (11/17/2023)   [employer] of Occupational Health - Occupational Stress Questionnaire  Feeling of Stress: Not at all  Social Connections: Socially Integrated (11/17/2023)   Social Connection and Isolation Panel    Frequency of Communication with Friends and Family: Twice a week    Frequency of Social Gatherings with Friends and Family: Twice a week    Attends Religious Services: More than 4 times per year    Active Member of [employer] or Organizations: Yes    Attends Engineer, structural: More than 4 times per year    Marital Status: Married    Tobacco Counseling Counseling given: Yes    Clinical Intake:  Pre-visit preparation completed: Yes  Pain : No/denies pain     Nutritional Risks: None Diabetes: No  Lab Results  Component Value Date   HGBA1C 5.5 05/15/2019     How often do you need to have someone help you when you read instructions, pamphlets, or other written materials from your doctor or pharmacy?: 1 - Never  Interpreter Needed?: No  Information entered by :: alia t/cma   Activities of Daily Living     11/17/2023    9:26 AM  In your present state of health, do you have any difficulty performing the following activities:  Hearing? 0  Vision? 0  Difficulty concentrating or making decisions? 0  Walking or climbing stairs? 0  Dressing or bathing? 0  Doing errands, shopping? 0  Preparing Food and eating ? N  Using the Toilet? N  In the past six months, have you accidently leaked urine? Y  Do you have problems with loss of bowel control? Y  Managing your  Medications? N  Managing your Finances? N  Housekeeping or managing your Housekeeping? N    Patient Care Team: Jolinda Norene HERO, DO as PCP - General (Family Medicine) Glean Stephane BROCKS, RN (Inactive) as Oncology Nurse Navigator Tyree Nanetta SAILOR, RN as Oncology Nurse Navigator Dewey Rush, MD as Consulting Physician (Radiation Oncology) Vanderbilt Ned, MD as Consulting Physician (General Surgery) Prentiss Annabella LABOR, NP as Nurse Practitioner (Gynecology) Nandigam, Kavitha V, MD as Consulting Physician (Gastroenterology) Livingston Glean, MD as Consulting Physician (Dermatology) Iruku, Praveena, MD as Consulting Physician (Hematology and Oncology)  I have updated your Care Teams any recent Medical Services you may have received from other providers in the past year.     Assessment:   This is a routine wellness examination for Carolyn Cooper.  Hearing/Vision screen Hearing Screening - Comments:: Pt denies hearing dif Vision Screening - Comments:: Pt wear readers only/pt goes to [facility] [address] in [place]/last ov was last week   Goals Addressed             This Visit's Progress    Exercise 3x per week (30 min per time)         Depression Screen     11/17/2023    9:30 AM 03/08/2023    3:25 PM 08/31/2022    9:18 AM 07/13/2022   10:01 AM 01/19/2022    9:39 AM 07/27/2021    8:16 AM 03/18/2021   11:06 AM  PHQ 2/9 Scores  PHQ - 2 Score 3 0 0 0 0 0 0  PHQ- 9 Score 5 0 0 0       Fall Risk     11/17/2023    9:24 AM 05/11/2023   10:06 AM 03/08/2023    3:25 PM 08/31/2022    9:17 AM 01/19/2022    9:39 AM  Fall Risk   Falls in the past year? 1 1 0 0 0  Number falls in past yr: 1 1 0 0   Injury with Fall? 1 1 0 0   Risk for fall due to : Impaired balance/gait;Impaired mobility History of fall(s) No Fall Risks No Fall Risks   Follow up  Falls evaluation completed Education provided Falls prevention discussed     MEDICARE RISK AT HOME:  Medicare Risk at Home Any stairs in or around the home?:  Yes If so, are there any without handrails?: Yes Home free of loose throw rugs in walkways, pet beds, electrical cords, etc?: Yes Adequate lighting in your home to reduce risk of falls?: Yes Life alert?: No Use of a cane, walker or w/c?: No Grab bars in the bathroom?: Yes Shower chair or bench in shower?: Yes Elevated toilet seat or a handicapped toilet?: Yes  TIMED UP AND GO:  Was the test performed?  no  Cognitive Function: 6CIT completed        11/17/2023    9:33 AM 08/31/2022    9:20 AM  6CIT Screen  What Year? 0 points 0 points  What month? 0 points 0 points  What time? 0 points 0 points  Count back from 20 0 points 0 points  Months in reverse 0 points 0 points  Repeat phrase 0 points 0 points  Total Score 0 points 0 points    Immunizations Immunization History  Administered Date(s) Administered   Fluad Quad(high Dose 65+) 01/04/2022   Influenza Split 01/16/2013   Influenza,inj,Quad PF,6+ Mos 01/14/2014, 01/14/2015, 01/05/2016, 01/03/2017, 12/30/2017, 12/28/2018, 12/29/2018, 01/24/2020   Influenza-Unspecified 01/14/2014, 01/14/2015, 01/05/2016, 01/13/2021, 01/26/2021   MMR 08/13/2009   Moderna Sars-Covid-2 Vaccination 04/06/2019, 05/11/2019   PNEUMOCOCCAL CONJUGATE-20 01/19/2022   Td 08/04/2009   Tdap 08/04/2009, 04/10/2019   Zoster Recombinant(Shingrix ) 12/01/2016, 07/08/2017   Zoster, Live 01/29/2015    Screening Tests Health Maintenance  Topic Date Due   COVID-19 Vaccine (3 - Moderna risk series) 06/08/2019   INFLUENZA VACCINE  11/04/2023   MAMMOGRAM  10/04/2024   Medicare Annual Wellness (AWV)  11/16/2024   Colonoscopy  11/23/2027   DTaP/Tdap/Td (4 - Td or Tdap) 04/09/2029   Pneumococcal Vaccine: 50+ Years  Completed   DEXA SCAN  Completed   Hepatitis C Screening  Completed   Zoster Vaccines- Shingrix   Completed   HPV VACCINES  Aged Out   Meningococcal B Vaccine  Aged Out    Health Maintenance  Health Maintenance Due  Topic Date Due    COVID-19 Vaccine (3 - Moderna risk series) 06/08/2019   INFLUENZA VACCINE  11/04/2023   Health Maintenance Items Addressed: See Nurse Notes at the end of this note  Additional Screening:  Vision Screening: Recommended annual ophthalmology exams for early detection of glaucoma and other disorders of the eye. Would you like a referral to an eye doctor? No    Dental Screening: Recommended annual dental exams for proper oral hygiene  Community Resource Referral / Chronic Care Management: CRR required this visit?  No   CCM required this visit?  No   Plan:    I have personally reviewed and noted the following in the patient's chart:   Medical and social history Use of alcohol, tobacco or illicit drugs  Current medications and supplements including opioid prescriptions. Patient is not currently taking opioid prescriptions. Functional ability and status Nutritional status Physical activity Advanced directives List of other physicians Hospitalizations, surgeries, and ER visits in previous 12 months Vitals Screenings to include cognitive, depression, and falls Referrals and appointments  In addition, I  have reviewed and discussed with patient certain preventive protocols, quality metrics, and best practice recommendations. A written personalized care plan for preventive services as well as general preventive health recommendations were provided to patient.   Ozie Ned, CMA   11/17/2023   After Visit Summary: (MyChart) Due to this being a telephonic visit, the after visit summary with patients personalized plan was offered to patient via MyChart   Notes: Nothing significant to report at this time.

## 2023-11-17 NOTE — Patient Instructions (Addendum)
 Carolyn Cooper , Thank you for taking time out of your busy schedule to complete your Annual Wellness Visit with me. I enjoyed our conversation and look forward to speaking with you again next year. I, as well as your care team,  appreciate your ongoing commitment to your health goals. Please review the following plan we discussed and let me know if I can assist you in the future. Your Game plan/ To Do List    Referrals: If you haven't heard from the office you've been referred to, please reach out to them at the phone provided.   Follow up Visits: We will see or speak with you next year for your Next Medicare AWV with our clinical staff 11/19/24 at 9:20a.m. Have you seen your provider in the last 6 months (3 months if uncontrolled diabetes)? Yes  Clinician Recommendations:  Aim for 30 minutes of exercise or brisk walking, 6-8 glasses of water, and 5 servings of fruits and vegetables each day.       This is a list of the screenings recommended for you:  Health Maintenance  Topic Date Due   COVID-19 Vaccine (3 - Moderna risk series) 06/08/2019   Medicare Annual Wellness Visit  08/31/2023   Flu Shot  11/04/2023   Mammogram  10/04/2024   Colon Cancer Screening  11/23/2027   DTaP/Tdap/Td vaccine (4 - Td or Tdap) 04/09/2029   Pneumococcal Vaccine for age over 10  Completed   DEXA scan (bone density measurement)  Completed   Hepatitis C Screening  Completed   Zoster (Shingles) Vaccine  Completed   HPV Vaccine  Aged Out   Meningitis B Vaccine  Aged Out    Advanced directives: (Declined) Advance directive discussed with you today. Even though you declined this today, please call our office should you change your mind, and we can give you the proper paperwork for you to fill out. Advance Care Planning is important because it:  [x]  Makes sure you receive the medical care that is consistent with your values, goals, and preferences  [x]  It provides guidance to your family and loved ones and reduces  their decisional burden about whether or not they are making the right decisions based on your wishes.  Follow the link provided in your after visit summary or read over the paperwork we have mailed to you to help you started getting your Advance Directives in place. If you need assistance in completing these, please reach out to us  so that we can help you!  See attachments for Preventive Care and Fall Prevention Tips.

## 2023-12-23 ENCOUNTER — Ambulatory Visit

## 2023-12-23 ENCOUNTER — Ambulatory Visit (INDEPENDENT_AMBULATORY_CARE_PROVIDER_SITE_OTHER)

## 2023-12-23 ENCOUNTER — Encounter: Payer: Self-pay | Admitting: Family Medicine

## 2023-12-23 ENCOUNTER — Ambulatory Visit: Payer: Self-pay | Admitting: Family Medicine

## 2023-12-23 VITALS — BP 119/69 | HR 80 | Temp 96.3°F | Ht 65.0 in | Wt 162.4 lb

## 2023-12-23 DIAGNOSIS — Z23 Encounter for immunization: Secondary | ICD-10-CM | POA: Diagnosis not present

## 2023-12-23 DIAGNOSIS — M79642 Pain in left hand: Secondary | ICD-10-CM

## 2023-12-23 DIAGNOSIS — W19XXXA Unspecified fall, initial encounter: Secondary | ICD-10-CM | POA: Diagnosis not present

## 2023-12-23 NOTE — Patient Instructions (Signed)
 Metacarpal Fracture  A metacarpal fracture is a break, also called a fracture, in one of the five bones in your hand. The bones go from your wrist to your knuckles. The metacarpal bones connect your thumb and fingers to your wrist. What are the causes? A fall. A hard hit to the hand. An injury that: Squeezes a knuckle. Stretches a finger out of place. Crushes the hand. What increases the risk? Playing contact sports. Having a condition that causes bones to become weak and brittle called osteoporosis. What are the signs or symptoms? Pain. Swelling. Stiffness. Bruises. Not being able to move a finger. A finger being a shape that's different than normal. A bend or bump in the hand or finger that's not normal. How is this diagnosed? Your symptoms and medical history. A physical exam. An X-ray. How is this treated? A metacarpal fracture may be treated with a splint, cast, or surgery. Treatment depends on how bad your fracture is and how the pieces of the broken bone line up with each other. If the pieces of the broken bone line up well, you may need to: Wear a splint or cast for several weeks. Have the injured finger taped to an uninjured finger next to it. This is called buddy taping. If the pieces of the broken bone do not line up well, your health care provider may: Move the bones back into position without surgery. This is called closed reduction. Do surgery to line up the fracture. Metal screws, pins, or wires are used to put the bones back in place. After these treatments, you will need to wear a splint or cast for several weeks. Treatment may also include: Follow-up visits and X-rays to make sure you're healing well. Physical therapy or occupational therapy after your cast or splint is removed. This will help your hand move better and get stronger. Follow these instructions at home: If you have a splint that can be taken off: Wear the splint as told. Take it off only if your  provider says you can. Check the skin around it every day. Tell your provider if you see problems. Loosen the splint if your fingers tingle, are numb, or turn cold and blue. Keep the splint clean and dry. If you have a cast that can't be taken off: Do not put pressure on any part of the cast until it's hard. This may take a few hours. Do not stick anything inside it to scratch your skin. Doing this can lead to infection. Check the skin around the cast every day. Tell your provider if you see problems. It's OK to put lotion on dry skin around the cast. Keep the cast clean and dry. Bathing Do not take baths, swim, or use a hot tub until you're told it's OK. Ask if you can shower. If the splint or cast isn't waterproof: Do not let it get wet. Cover it when you take a bath or shower. Use a cover that doesn't let any water in. Managing pain, stiffness, and swelling  Use ice or an ice pack as told. If you have a splint that you can take off, remove it only as told. Place a towel between your skin and the ice. Leave the ice on for 20 minutes, 2-3 times a day. If your skin turns red, take off the ice right away to prevent skin damage. The risk of damage is higher if you can't feel pain, heat, or cold. Move your fingers often to reduce stiffness and swelling.  Raise your hand above the level of your heart while you're sitting or lying down. Use pillows as needed. Activity Do not lift or hold anything with your injured hand. Ask what things are safe for you to do at home. Ask when you can go back to work or school. Exercise as told. Driving Ask your provider if it's safe to drive or use machines while taking your medicine. Ask when it's safe to drive if you have a cast or splint on your hand. General instructions Take your medicines only as told by your provider. Do not smoke, vape, or use nicotine or tobacco. These can slow down healing. Keep all follow-up visits. Your provider will need to  check how your fracture is healing. Contact a health care provider if: You have pain that gets worse or does not get better with medicine. You have redness or swelling that gets worse. You have a fever. You have a bad smell coming from under your cast or splint. Get help right away if: You have very bad pain. Your hand or fingernails turn blue or gray, even after you loosen your splint. Your hand feels cold or numb, even after you loosen your splint. This information is not intended to replace advice given to you by your health care provider. Make sure you discuss any questions you have with your health care provider. Document Revised: 02/01/2023 Document Reviewed: 08/02/2022 Elsevier Patient Education  2024 [employer].

## 2023-12-23 NOTE — Progress Notes (Signed)
 Subjective: CC: Fall PCP: Jolinda Norene HERO, DO YEP:Carolyn Cooper is a 68 y.o. female presenting to clinic today for:  History of Present Illness   Fall:  She reports mechanical fall at the [employer] 2 weeks ago where she fell on outstretched hand.  She notes that she was so embarrassed that she really did not pay attention to what was going on and thought she had just scuffed her left hand.  She notes though that she has had persistent pain and aching in the lateral aspect of the left hand.  She is able to form a fist and denies any immediate swelling or ecchymosis of that area.  She is using Motrin  which is helpful.  She has had a brace for carpal tunnel that she is also been using which seems to alleviate some of the discomfort.  Denies any sensory changes in the left hand   ROS: Per HPI  Allergies  Allergen Reactions   Sulfa Antibiotics Hives   Past Medical History:  Diagnosis Date   Allergy    seasonal   Anxiety    Barrett's esophagus    Breast cancer (HCC) 01/2020   left breast ILC   Cataract    had surgery   Depression    Family history of breast cancer    Family history of kidney cancer    Family history of ovarian cancer    Family history of stomach cancer    GERD (gastroesophageal reflux disease)    IBS (irritable bowel syndrome)    Migraines    Osteopenia 2019   T score -1.5 FRAX 8% / 0.7%   Personal history of radiation therapy     Current Outpatient Medications:    anastrozole  (ARIMIDEX ) 1 MG tablet, Take 1 tablet (1 mg total) by mouth daily., Disp: 90 tablet, Rfl: 3   Calcium Citrate-Vitamin D  (CALCIUM + D PO), Take by mouth daily. 1200 - 1000 mg tablet, Disp: , Rfl:    escitalopram  (LEXAPRO ) 20 MG tablet, TAKE 1 TABLET BY MOUTH EVERY DAY, Disp: 90 tablet, Rfl: 0   Galcanezumab -gnlm (EMGALITY ) 120 MG/ML SOAJ, Inject 120 mg into the skin every 30 (thirty) days., Disp: 3 mL, Rfl: 11   omeprazole  (PRILOSEC) 40 MG capsule, TAKE 1 CAPSULE (40 MG  TOTAL) BY MOUTH DAILY., Disp: 90 capsule, Rfl: 1   SUMAtriptan  (IMITREX ) 100 MG tablet, May repeat in 2 hours if headache persists or recurs., Disp: 9 tablet, Rfl: 12 Social History   Socioeconomic History   Marital status: Married    Spouse name: Not on file   Number of children: Not on file   Years of education: Not on file   Highest education level: Associate degree: occupational, Scientist, product/process development, or vocational program  Occupational History   Not on file  Tobacco Use   Smoking status: Never   Smokeless tobacco: Never  Vaping Use   Vaping status: Never Used  Substance and Sexual Activity   Alcohol use: Not Currently    Alcohol/week: 0.0 standard drinks of alcohol   Drug use: No   Sexual activity: Not Currently    Partners: Male    Birth control/protection: Post-menopausal    Comment: 1st intercourse 68 yo-Fewer than 5 partners (husband diagnosed with multiple myeloma)  Other Topics Concern   Not on file  Social History Narrative   Not on file   Social Drivers of Health   Financial Resource Strain: Low Risk  (11/17/2023)   Overall Financial Resource Strain (CARDIA)  Difficulty of Paying Living Expenses: Not hard at all  Food Insecurity: No Food Insecurity (11/17/2023)   Hunger Vital Sign    Worried About Running Out of Food in the Last Year: Never true    Ran Out of Food in the Last Year: Never true  Transportation Needs: No Transportation Needs (11/17/2023)   PRAPARE - Administrator, Civil Service (Medical): No    Lack of Transportation (Non-Medical): No  Physical Activity: Inactive (11/17/2023)   Exercise Vital Sign    Days of Exercise per Week: 0 days    Minutes of Exercise per Session: 0 min  Stress: No Stress Concern Present (11/17/2023)   [employer] of Occupational Health - Occupational Stress Questionnaire    Feeling of Stress: Not at all  Social Connections: Socially Integrated (11/17/2023)   Social Connection and Isolation Panel    Frequency of  Communication with Friends and Family: Twice a week    Frequency of Social Gatherings with Friends and Family: Twice a week    Attends Religious Services: More than 4 times per year    Active Member of [employer] or Organizations: Yes    Attends Engineer, structural: More than 4 times per year    Marital Status: Married  Catering manager Violence: Not At Risk (11/17/2023)   Humiliation, Afraid, Rape, and Kick questionnaire    Fear of Current or Ex-Partner: No    Emotionally Abused: No    Physically Abused: No    Sexually Abused: No   Family History  Problem Relation Age of Onset   Diverticulitis Mother    Hypertension Mother    Heart disease Father    Kidney cancer Father 39   Cancer Father    Hypertension Father    Ovarian cancer Maternal Grandmother 59   Stomach cancer Maternal Grandfather        dx 15s   Breast cancer Other        dx >50, maternal great-aunt   ADD / ADHD Daughter    ADD / ADHD Son    Alcohol abuse Son    Depression Son    Drug abuse Son    Colon cancer Neg Hx    Esophageal cancer Neg Hx    Rectal cancer Neg Hx     Objective: Office vital signs reviewed. BP 119/69   Pulse 80   Temp (!) 96.3 F (35.7 C)   Ht 5' 5 (1.651 m)   Wt 162 lb 6 oz (73.7 kg)   SpO2 98%   BMI 27.02 kg/m   Physical Examination:  General: Awake, alert, well nourished, No acute distress MSK: No gross swelling, discoloration or warmth to the hand.  Left hand with tenderness to palpation along the midshaft of the fifth metacarpal bone.  There is a palpable soft tissue swelling here but no overt deformity.  She has full active range of motion of the fingers and sensation is grossly intact.  No results found.   Assessment/ Plan: 68 y.o. female   Assessment & Plan   Exquisite tenderness along the midshaft of the fifth metacarpal of the left  hand.  I ordered x-ray and upon personal review there does appear to be a small crack in the proximal third of that fifth  metacarpal bone that does not involve the joint.  It does not appear to be displaced at all nor a full fracture through the shaft.  Awaiting for review by radiology.  We discussed splinting technique and she  wants to use a brace that she has at home instead of formal gutter splint here today.  I have close follow-up with her in about 2 weeks and we will plan to rex-ray at that time.  We discussed red flag signs and symptoms and reasons for reevaluation sooner than that time.  At this time no indication for referral to orthopedics.  Okay to continue oral analgesics as she has been    Norene CHRISTELLA Fielding, DO [facility] [phone]

## 2023-12-24 ENCOUNTER — Other Ambulatory Visit: Payer: Self-pay | Admitting: Family Medicine

## 2023-12-24 DIAGNOSIS — F419 Anxiety disorder, unspecified: Secondary | ICD-10-CM

## 2023-12-26 ENCOUNTER — Encounter: Payer: Self-pay | Admitting: Family Medicine

## 2023-12-26 DIAGNOSIS — G43809 Other migraine, not intractable, without status migrainosus: Secondary | ICD-10-CM

## 2023-12-26 MED ORDER — RIZATRIPTAN BENZOATE 10 MG PO TABS
10.0000 mg | ORAL_TABLET | ORAL | 0 refills | Status: DC | PRN
Start: 2023-12-26 — End: 2024-01-06

## 2024-01-06 ENCOUNTER — Encounter: Payer: Self-pay | Admitting: Family Medicine

## 2024-01-06 ENCOUNTER — Other Ambulatory Visit: Payer: Self-pay | Admitting: Family Medicine

## 2024-01-06 ENCOUNTER — Ambulatory Visit (INDEPENDENT_AMBULATORY_CARE_PROVIDER_SITE_OTHER): Admitting: Family Medicine

## 2024-01-06 VITALS — BP 125/77 | HR 75 | Temp 97.6°F | Ht 65.0 in | Wt 161.2 lb

## 2024-01-06 DIAGNOSIS — F419 Anxiety disorder, unspecified: Secondary | ICD-10-CM | POA: Diagnosis not present

## 2024-01-06 DIAGNOSIS — G43809 Other migraine, not intractable, without status migrainosus: Secondary | ICD-10-CM

## 2024-01-06 DIAGNOSIS — E559 Vitamin D deficiency, unspecified: Secondary | ICD-10-CM

## 2024-01-06 DIAGNOSIS — E7800 Pure hypercholesterolemia, unspecified: Secondary | ICD-10-CM | POA: Diagnosis not present

## 2024-01-06 DIAGNOSIS — Z0000 Encounter for general adult medical examination without abnormal findings: Secondary | ICD-10-CM

## 2024-01-06 DIAGNOSIS — G43709 Chronic migraine without aura, not intractable, without status migrainosus: Secondary | ICD-10-CM

## 2024-01-06 DIAGNOSIS — Z6379 Other stressful life events affecting family and household: Secondary | ICD-10-CM | POA: Diagnosis not present

## 2024-01-06 DIAGNOSIS — Z0001 Encounter for general adult medical examination with abnormal findings: Secondary | ICD-10-CM

## 2024-01-06 MED ORDER — SUMATRIPTAN SUCCINATE 100 MG PO TABS: ORAL_TABLET | ORAL | 12 refills | Status: DC

## 2024-01-06 MED ORDER — EMGALITY 120 MG/ML ~~LOC~~ SOAJ: 120.0000 mg | SUBCUTANEOUS | 11 refills | Status: AC

## 2024-01-06 MED ORDER — ESCITALOPRAM OXALATE 20 MG PO TABS
20.0000 mg | ORAL_TABLET | Freq: Every day | ORAL | 3 refills | Status: AC
Start: 2024-01-06 — End: ?

## 2024-01-06 NOTE — Progress Notes (Signed)
 Carolyn Cooper is a 68 y.o. female presents to office today for annual physical exam examination.    Patient reports that overall things have been stable from a health standpoint.  She had a migraine headache that was refractory to her homemigraine medications and she feels like it was likely stress-induced as she has had quite a bit of social issues going on with her children.  Her daughter apparently had some type of domestic disturbance with her spouse and they are now separated.  She has been tried to get back into her normal routine in efforts to alleviate some of the stress.  She is compliant with Lexapro  and continues to be involved with church and Meals on Wheels.  She will be going to [place] at the end of January for a mission trip for a week and is getting prepared for that with vaccine etc.  Occupation: Retired, Marital status: Married, Substance use: none There are no preventive care reminders to display for this patient.  Immunization History  Administered Date(s) Administered   Fluad Quad(high Dose 65+) 01/04/2022   INFLUENZA, HIGH DOSE SEASONAL PF 12/23/2023   Influenza Split 01/16/2013   Influenza,inj,Quad PF,6+ Mos 01/14/2014, 01/14/2015, 01/05/2016, 01/03/2017, 12/30/2017, 12/28/2018, 12/29/2018, 01/24/2020   Influenza-Unspecified 01/14/2014, 01/14/2015, 01/05/2016, 01/13/2021, 01/26/2021   MMR 08/13/2009   Moderna Sars-Covid-2 Vaccination 04/06/2019, 05/11/2019   PNEUMOCOCCAL CONJUGATE-20 01/19/2022   Td 08/04/2009   Tdap 08/04/2009, 04/10/2019   Zoster Recombinant(Shingrix ) 12/01/2016, 07/08/2017   Zoster, Live 01/29/2015   Past Medical History:  Diagnosis Date   Allergy    seasonal   Anxiety    Barrett's esophagus    Breast cancer (HCC) 01/2020   left breast ILC   Cataract    had surgery   Depression    Family history of breast cancer    Family history of kidney cancer    Family history of ovarian cancer    Family history of stomach cancer    GERD  (gastroesophageal reflux disease)    IBS (irritable bowel syndrome)    Migraines    Osteopenia 2019   T score -1.5 FRAX 8% / 0.7%   Personal history of radiation therapy    Social History   Socioeconomic History   Marital status: Married    Spouse name: Not on file   Number of children: Not on file   Years of education: Not on file   Highest education level: Associate degree: occupational, Scientist, product/process development, or vocational program  Occupational History   Not on file  Tobacco Use   Smoking status: Never   Smokeless tobacco: Never  Vaping Use   Vaping status: Never Used  Substance and Sexual Activity   Alcohol use: Not Currently    Alcohol/week: 0.0 standard drinks of alcohol   Drug use: No   Sexual activity: Not Currently    Partners: Male    Birth control/protection: Post-menopausal    Comment: 1st intercourse 68 yo-Fewer than 5 partners (husband diagnosed with multiple myeloma)  Other Topics Concern   Not on file  Social History Narrative   Not on file   Social Drivers of Health   Financial Resource Strain: Low Risk  (11/17/2023)   Overall Financial Resource Strain (CARDIA)    Difficulty of Paying Living Expenses: Not hard at all  Food Insecurity: No Food Insecurity (11/17/2023)   Hunger Vital Sign    Worried About Running Out of Food in the Last Year: Never true    Ran Out of Food in the  Last Year: Never true  Transportation Needs: No Transportation Needs (11/17/2023)   PRAPARE - Administrator, Civil Service (Medical): No    Lack of Transportation (Non-Medical): No  Physical Activity: Inactive (11/17/2023)   Exercise Vital Sign    Days of Exercise per Week: 0 days    Minutes of Exercise per Session: 0 min  Stress: No Stress Concern Present (11/17/2023)   [employer] of Occupational Health - Occupational Stress Questionnaire    Feeling of Stress: Not at all  Social Connections: Socially Integrated (11/17/2023)   Social Connection and Isolation Panel     Frequency of Communication with Friends and Family: Twice a week    Frequency of Social Gatherings with Friends and Family: Twice a week    Attends Religious Services: More than 4 times per year    Active Member of [employer] or Organizations: Yes    Attends Banker Meetings: More than 4 times per year    Marital Status: Married  Catering manager Violence: Not At Risk (11/17/2023)   Humiliation, Afraid, Rape, and Kick questionnaire    Fear of Current or Ex-Partner: No    Emotionally Abused: No    Physically Abused: No    Sexually Abused: No   Past Surgical History:  Procedure Laterality Date   BREAST LUMPECTOMY Left 2021   BREAST LUMPECTOMY WITH RADIOACTIVE SEED AND SENTINEL LYMPH NODE BIOPSY Left 02/14/2020   Procedure: LEFT BREAST LUMPECTOMY WITH RADIOACTIVE SEED AND SENTINEL LYMPH NODE MAPPING;  Surgeon: Vanderbilt Ned, MD;  Location: [facility];  Service: General;  Laterality: Left;  PECTORAL BLOCK   CATARACT EXTRACTION, BILATERAL  2018   CERVICAL CONE BIOPSY  1996   COLONOSCOPY  07/21/2007   CYSTOSCOPY     EYE SURGERY  Retina surgery bilateral and cataract bilat   MOUTH SURGERY  1978   WISDOM TEETH   Family History  Problem Relation Age of Onset   Diverticulitis Mother    Hypertension Mother    Colon cancer Mother    Cancer - Colon Mother    Heart disease Father    Kidney cancer Father 53   Cancer Father    Hypertension Father    ADD / ADHD Daughter    ADD / ADHD Son    Alcohol abuse Son    Depression Son    Drug abuse Son    Ovarian cancer Maternal Grandmother 31   Stomach cancer Maternal Grandfather        dx 13s   Breast cancer Other        dx >50, maternal great-aunt   Esophageal cancer Neg Hx    Rectal cancer Neg Hx     Current Outpatient Medications:    anastrozole  (ARIMIDEX ) 1 MG tablet, Take 1 tablet (1 mg total) by mouth daily., Disp: 90 tablet, Rfl: 3   Calcium Citrate-Vitamin D  (CALCIUM + D PO), Take by mouth daily. 1200 -  1000 mg tablet, Disp: , Rfl:    omeprazole  (PRILOSEC) 40 MG capsule, TAKE 1 CAPSULE (40 MG TOTAL) BY MOUTH DAILY., Disp: 90 capsule, Rfl: 1   escitalopram  (LEXAPRO ) 20 MG tablet, Take 1 tablet (20 mg total) by mouth daily., Disp: 90 tablet, Rfl: 3   Galcanezumab -gnlm (EMGALITY ) 120 MG/ML SOAJ, Inject 120 mg into the skin every 30 (thirty) days., Disp: 3 mL, Rfl: 11   SUMAtriptan  (IMITREX ) 100 MG tablet, May repeat in 2 hours if headache persists or recurs., Disp: 9 tablet, Rfl: 12  Allergies  Allergen Reactions   Sulfa Antibiotics Hives     ROS: Review of Systems Pertinent items noted in HPI and remainder of comprehensive ROS otherwise negative.    Physical exam BP 125/77   Pulse 75   Temp 97.6 F (36.4 C)   Ht 5' 5 (1.651 m)   Wt 161 lb 4 oz (73.1 kg)   SpO2 94%   BMI 26.83 kg/m  General appearance: alert, cooperative, appears stated age, and no distress Head: Normocephalic, without obvious abnormality, atraumatic Eyes: negative findings: lids and lashes normal, conjunctivae and sclerae normal, corneas clear, and pupils equal, round, reactive to light and accomodation Ears: normal TM's and external ear canals both ears Nose: Nares normal. Septum midline. Mucosa normal. No drainage or sinus tenderness. Throat: lips, mucosa, and tongue normal; teeth and gums normal Neck: no adenopathy, no carotid bruit, supple, symmetrical, trachea midline, and thyroid  not enlarged, symmetric, no tenderness/mass/nodules Back: symmetric, no curvature. ROM normal. No CVA tenderness. Lungs: clear to auscultation bilaterally Heart: regular rate and rhythm, S1, S2 normal, no murmur, click, rub or gallop Abdomen: soft, non-tender; bowel sounds normal; no masses,  no organomegaly Extremities: extremities normal, atraumatic, no cyanosis or edema Pulses: 2+ and symmetric Skin: Skin color, texture, turgor normal. No rashes or lesions Lymph nodes: Cervical, supraclavicular, and axillary nodes  normal. Neurologic: Grossly normal      01/06/2024    1:06 PM 12/23/2023    8:38 AM 11/17/2023    9:30 AM  Depression screen PHQ 2/9  Decreased Interest 0 0 0  Down, Depressed, Hopeless 0 0 3  PHQ - 2 Score 0 0 3  Altered sleeping 0 0 0  Tired, decreased energy 0 0 2  Change in appetite 0 0   Feeling bad or failure about yourself  0 0 0  Trouble concentrating 0 0 0  Moving slowly or fidgety/restless 0 0 0  Suicidal thoughts 0 0 0  PHQ-9 Score 0 0 5  Difficult doing work/chores Not difficult at all Not difficult at all Somewhat difficult      01/06/2024    1:07 PM 12/23/2023    8:38 AM 03/08/2023    3:25 PM 07/13/2022   10:01 AM  GAD 7 : Generalized Anxiety Score  Nervous, Anxious, on Edge 2 1 0 0  Control/stop worrying 1 0 0 0  Worry too much - different things 1 0 0 0  Trouble relaxing 2 0 0 0  Restless 1 0 0 0  Easily annoyed or irritable 0 0 0 0  Afraid - awful might happen 2 0 0 0  Total GAD 7 Score 9 1 0 0  Anxiety Difficulty Not difficult at all Not difficult at all  Not difficult at all    No results found for this or any previous visit (from the past 2160 hours).   Assessment/ Plan: Adrien Nelwyn Kiss here for annual physical exam.   Annual physical exam  Pure hypercholesterolemia - Plan: CMP14+EGFR, TSH, Lipid panel  Chronic migraine without aura without status migrainosus, not intractable - Plan: CMP14+EGFR, CBC, SUMAtriptan  (IMITREX ) 100 MG tablet  Vitamin D  deficiency - Plan: CMP14+EGFR, VITAMIN D  25 Hydroxy (Vit-D Deficiency, Fractures)  Anxiety - Plan: escitalopram  (LEXAPRO ) 20 MG tablet  Other migraine without status migrainosus, not intractable - Plan: Galcanezumab -gnlm (EMGALITY ) 120 MG/ML SOAJ   Fasting labs ordered.  She will return at a later date.  All medications renewed.  Chronic issues are mostly stable except for migraine headaches.  Will continue to  monitor this.  Will offer referral for counseling services should she desire in the future  but she is well supported by her church.  Continue SSRI  Counseled on healthy lifestyle choices, including diet (rich in fruits, vegetables and lean meats and low in salt and simple carbohydrates) and exercise (at least 30 minutes of moderate physical activity daily).  Patient to follow up 1 year for CPE  Sephiroth Mcluckie M. Jolinda, DO

## 2024-01-09 ENCOUNTER — Other Ambulatory Visit (HOSPITAL_COMMUNITY): Payer: Self-pay

## 2024-01-09 ENCOUNTER — Other Ambulatory Visit: Payer: Self-pay | Admitting: Family Medicine

## 2024-01-09 ENCOUNTER — Telehealth: Payer: Self-pay

## 2024-01-09 ENCOUNTER — Telehealth: Payer: Self-pay | Admitting: Family Medicine

## 2024-01-09 DIAGNOSIS — G43809 Other migraine, not intractable, without status migrainosus: Secondary | ICD-10-CM

## 2024-01-09 NOTE — Telephone Encounter (Signed)
 PA request has been Started. New Encounter has been or will be created for follow up. For additional info see Pharmacy Prior Auth telephone encounter from 01/09/24.

## 2024-01-09 NOTE — Telephone Encounter (Signed)
 Pharmacy Patient Advocate Encounter   Received notification from Pt Calls Messages that prior authorization for Emgality  120MG /ML auto-injectors (migraine)  is required/requested.   Insurance verification completed.   The patient is insured through [facility]   Per test claim:  AIMOVIG , DIVALPROEX, TOPIRAMATE , PROPRANOLOL, NURTEC is preferred by the insurance.  If suggested medication is appropriate, Please send in a new RX and discontinue this one. If not, please advise as to why it's not appropriate so that we may request a Prior Authorization. Please note, some preferred medications may still require a PA.  If the suggested medications have not been trialed and there are no contraindications to their use, the PA will not be submitted, as it will not be approved.   KEY: [id number]

## 2024-01-09 NOTE — Telephone Encounter (Signed)
 AIMOVIG  70 MG/ML SOAJ        Changed from: Galcanezumab -gnlm (EMGALITY ) 120 MG/ML SOAJ   Pharmacy comment: Alternative Requested:THE PRESCRIBED MEDICATION IS NOT COVERED BY INSURANCE. PLEASE CONSIDER CHANGING TO ONE OF THE SUGGESTED COVERED ALTERNATIVES.

## 2024-01-09 NOTE — Telephone Encounter (Signed)
 Copied from CRM #[id number]. Topic: Clinical - Prescription Issue >> Jan 09, 2024 11:29 AM Mercer PEDLAR wrote: Reason for CRM: Mitzie is calling from [facility] regarding prescription for Galcanezumab -gnlm (EMGALITY ) 120 MG/ML SOAJ. She stated that this medication is not covered under patient's plan and the alternative medication is Aimovig . Mitzie stated that a new prescription is needed for the alternative medication if approved by provider as well as a prior auth.    Callback: [phone]

## 2024-01-09 NOTE — Telephone Encounter (Signed)
 Trial of Emgality . Samples provided and instructions for use discussed. I would like her to see CCM, clinical pharmacist in the next couple of weeks for recheck and discussion of patient assistance program for either Emgality  or Nurtec. Patient has failed multiple treatments in the past including various triptans, aimovig , Ajovy . Cannot take tricyclic antidepressant because using SSRI for anxiety and depression.    The above is from OV notes 01/2022-please try for PA of Emgality 

## 2024-01-09 NOTE — Telephone Encounter (Signed)
 Do you all know if she is getting this from patient assistance?

## 2024-01-09 NOTE — Telephone Encounter (Signed)
 I believe she may be getting this product from patient assistance.

## 2024-01-10 NOTE — Telephone Encounter (Signed)
 Please see separate note. This has already been addressed.  She is on PAP for shot w. julie

## 2024-01-11 ENCOUNTER — Ambulatory Visit: Admitting: Gastroenterology

## 2024-01-11 ENCOUNTER — Encounter: Payer: Self-pay | Admitting: Gastroenterology

## 2024-01-11 ENCOUNTER — Telehealth: Payer: Self-pay

## 2024-01-11 ENCOUNTER — Other Ambulatory Visit (HOSPITAL_COMMUNITY): Payer: Self-pay

## 2024-01-11 VITALS — BP 122/86 | HR 71 | Ht 65.0 in | Wt 160.0 lb

## 2024-01-11 DIAGNOSIS — K219 Gastro-esophageal reflux disease without esophagitis: Secondary | ICD-10-CM | POA: Diagnosis not present

## 2024-01-11 DIAGNOSIS — Z800 Family history of malignant neoplasm of digestive organs: Secondary | ICD-10-CM | POA: Diagnosis not present

## 2024-01-11 DIAGNOSIS — K589 Irritable bowel syndrome without diarrhea: Secondary | ICD-10-CM

## 2024-01-11 DIAGNOSIS — K580 Irritable bowel syndrome with diarrhea: Secondary | ICD-10-CM

## 2024-01-11 MED ORDER — DICYCLOMINE HCL 10 MG PO CAPS: 10.0000 mg | ORAL_CAPSULE | Freq: Every day | ORAL | 3 refills | Status: AC | PRN

## 2024-01-11 NOTE — Progress Notes (Signed)
 Carolyn Cooper    [id number]    1956-04-02  Primary Care Physician:Gottschalk, Norene HERO, DO  Referring Physician: Jolinda Norene HERO, DO [address] [place],  [place] [address]   Chief complaint:  IBS  Discussed the use of AI scribe software for clinical note transcription with the patient, who gave verbal consent to proceed.  History of Present Illness Carolyn Cooper is a 68 year old female with irritable bowel syndrome who presents for follow-up of her gastrointestinal symptoms.  Altered bowel habits and abdominal symptoms - Intermittent symptoms of irritable bowel syndrome, including cramping and urgent need to defecate - Symptoms typically occur in the morning after coffee consumption - Bowel movements are fragmented, requiring multiple trips to the bathroom - Occasional episodes of nausea and vomiting with a 'horrible taste' - Episodes occur a few times per year - Frequency of episodes has decreased with daily fiber supplementation  Dietary and lifestyle factors - Daily fiber supplement use, perceived as beneficial for symptoms - Low water intake throughout the day, possibly affecting symptoms - Stress appears to improve symptoms, which is atypical for her usual pattern  Travel-related concerns - Planning a mission trip to [place] at the end of January - Concerned about managing irritable bowel syndrome symptoms while traveling, especially with dietary restrictions and need to avoid local water  Unintentional weight loss - Recent weight loss of five pounds, attributed to stress related to her children - Does not consider the weight loss significant   Colonoscopy 11/23/22 - Hemorrhoids found on perianal exam. - Diverticulosis in the sigmoid colon and in the descending colon. - Non- bleeding prolapsed external and internal hemorrhoids. - The examination was otherwise normal.  EGD 06/27/2019 - No endoscopic esophageal abnormality to explain  patient' s dysphagia. Biopsied. - Gastroesophageal flap valve classified as Hill Grade II ( fold present, opens with respiration) . - Normal stomach. - Normal examined duodenum.  Colonoscopy 10/24/2017: 8mm sessile serrated polyp in ascending colon, diverticulosis and internal/external hemorrhoids  Outpatient Encounter Medications as of 01/11/2024  Medication Sig   anastrozole  (ARIMIDEX ) 1 MG tablet Take 1 tablet (1 mg total) by mouth daily.   Calcium Citrate-Vitamin D  (CALCIUM + D PO) Take by mouth daily. 1200 - 1000 mg tablet   escitalopram  (LEXAPRO ) 20 MG tablet Take 1 tablet (20 mg total) by mouth daily.   Galcanezumab -gnlm (EMGALITY ) 120 MG/ML SOAJ Inject 120 mg into the skin every 30 (thirty) days.   omeprazole  (PRILOSEC) 40 MG capsule TAKE 1 CAPSULE (40 MG TOTAL) BY MOUTH DAILY.   SUMAtriptan  (IMITREX ) 100 MG tablet May repeat in 2 hours if headache persists or recurs.   No facility-administered encounter medications on file as of 01/11/2024.    Allergies as of 01/11/2024 - Review Complete 01/11/2024  Allergen Reaction Noted   Sulfa antibiotics Hives 09/29/2010    Past Medical History:  Diagnosis Date   Allergy    seasonal   Anxiety    Barrett's esophagus    Breast cancer (HCC) 01/2020   left breast ILC   Cataract    had surgery   Depression    Family history of breast cancer    Family history of kidney cancer    Family history of ovarian cancer    Family history of stomach cancer    GERD (gastroesophageal reflux disease)    IBS (irritable bowel syndrome)    Migraines    Osteopenia 2019   T  score -1.5 FRAX 8% / 0.7%   Personal history of radiation therapy     Past Surgical History:  Procedure Laterality Date   BREAST LUMPECTOMY Left 2021   BREAST LUMPECTOMY WITH RADIOACTIVE SEED AND SENTINEL LYMPH NODE BIOPSY Left 02/14/2020   Procedure: LEFT BREAST LUMPECTOMY WITH RADIOACTIVE SEED AND SENTINEL LYMPH NODE MAPPING;  Surgeon: Vanderbilt Ned, MD;  Location: [facility];  Service: General;  Laterality: Left;  PECTORAL BLOCK   CATARACT EXTRACTION, BILATERAL  2018   CERVICAL CONE BIOPSY  1996   COLONOSCOPY  07/21/2007   CYSTOSCOPY     EYE SURGERY  Retina surgery bilateral and cataract bilat   MOUTH SURGERY  1978   WISDOM TEETH    Family History  Problem Relation Age of Onset   Diverticulitis Mother    Hypertension Mother    Colon cancer Mother    Cancer - Colon Mother    Heart disease Father    Kidney cancer Father 25   Cancer Father    Hypertension Father    ADD / ADHD Daughter    ADD / ADHD Son    Alcohol abuse Son    Depression Son    Drug abuse Son    Ovarian cancer Maternal Grandmother 72   Stomach cancer Maternal Grandfather        dx 83s   Breast cancer Other        dx >50, maternal great-aunt   Esophageal cancer Neg Hx    Rectal cancer Neg Hx     Social History   Socioeconomic History   Marital status: Married    Spouse name: Not on file   Number of children: Not on file   Years of education: Not on file   Highest education level: Associate degree: occupational, Scientist, product/process development, or vocational program  Occupational History   Not on file  Tobacco Use   Smoking status: Never   Smokeless tobacco: Never  Vaping Use   Vaping status: Never Used  Substance and Sexual Activity   Alcohol use: Not Currently    Alcohol/week: 0.0 standard drinks of alcohol   Drug use: No   Sexual activity: Not Currently    Partners: Male    Birth control/protection: Post-menopausal    Comment: 1st intercourse 68 yo-Fewer than 5 partners (husband diagnosed with multiple myeloma)  Other Topics Concern   Not on file  Social History Narrative   Not on file   Social Drivers of Health   Financial Resource Strain: Low Risk  (11/17/2023)   Overall Financial Resource Strain (CARDIA)    Difficulty of Paying Living Expenses: Not hard at all  Food Insecurity: No Food Insecurity (11/17/2023)   Hunger Vital Sign    Worried About Running  Out of Food in the Last Year: Never true    Ran Out of Food in the Last Year: Never true  Transportation Needs: No Transportation Needs (11/17/2023)   PRAPARE - Administrator, Civil Service (Medical): No    Lack of Transportation (Non-Medical): No  Physical Activity: Inactive (11/17/2023)   Exercise Vital Sign    Days of Exercise per Week: 0 days    Minutes of Exercise per Session: 0 min  Stress: No Stress Concern Present (11/17/2023)   [employer] of Occupational Health - Occupational Stress Questionnaire    Feeling of Stress: Not at all  Social Connections: Socially Integrated (11/17/2023)   Social Connection and Isolation Panel    Frequency of Communication with Friends  and Family: Twice a week    Frequency of Social Gatherings with Friends and Family: Twice a week    Attends Religious Services: More than 4 times per year    Active Member of [employer] or Organizations: Yes    Attends Engineer, structural: More than 4 times per year    Marital Status: Married  Catering manager Violence: Not At Risk (11/17/2023)   Humiliation, Afraid, Rape, and Kick questionnaire    Fear of Current or Ex-Partner: No    Emotionally Abused: No    Physically Abused: No    Sexually Abused: No      Review of systems: All other review of systems negative except as mentioned in the HPI.   Physical Exam: Vitals:   01/11/24 1044  BP: 122/86  Pulse: 71   Body mass index is 26.63 kg/m. Gen:      No acute distress HEENT:  sclera anicteric CV: s1s2 rrr, no murmur Lungs: B/l clear. Abd:      soft, non-tender; no palpable masses, no distension Ext:    No edema Neuro: alert and oriented x 3 Psych: normal mood and affect  Data Reviewed:  Reviewed labs, radiology imaging, old records and pertinent past GI work up   Assessment and Plan Assessment & Plan Irritable bowel syndrome without diarrhea IBS with symptoms of cramping, nausea, and vomiting, primarily in the morning.  Symptoms improve with fiber supplementation and paradoxically with stress. Current fiber intake is one capsule daily, with some improvement. Symptoms include cramping, fragmented bowel movements, and occasional vomiting. - Increase fiber intake to twice daily with breakfast and dinner. - Increase water intake to eight cups daily to aid fiber effectiveness. - Prescribe antispasmodic medication (dicyclomine) for use as needed, up to twice daily. - Advise on dietary precautions during travel to [place], including consuming cooked foods and avoiding local water. - Avoid probiotic use prior to travel.  Family history of malignant neoplasm of colon Will change her recall colonoscopy to August 2029, her mother was diagnosed with colon cancer  GERD: Continue omeprazole  and antireflux measures  Return in 1 year or sooner if needed   The patient was provided an opportunity to ask questions and all were answered. The patient agreed with the plan and demonstrated an understanding of the instructions.  LOIS Wilkie Mcgee , MD    CC: Jolinda Norene HERO, DO

## 2024-01-11 NOTE — Telephone Encounter (Signed)
 Pharmacy Patient Advocate Encounter   Received notification from CoverMyMeds that prior authorization for Dicyclomine HCl 10MG  capsules is required/requested.   Insurance verification completed.   The patient is insured through [facility] Medicare.   Per test claim: PA required; PA submitted to above mentioned insurance via Latent Key/confirmation #/EOC [id number] Status is pending

## 2024-01-11 NOTE — Patient Instructions (Addendum)
 VISIT SUMMARY:  Today, we discussed your ongoing irritable bowel syndrome (IBS) symptoms and made plans to help manage them, especially with your upcoming trip to [place]. We also reviewed your family history of colon cancer and adjusted your colonoscopy schedule accordingly.  YOUR PLAN:  IRRITABLE BOWEL SYNDROME (IBS): You have IBS with symptoms like cramping, nausea, and vomiting, especially in the morning. Your symptoms have improved with daily fiber supplements and stress management. -Increase fiber intake to twice daily with breakfast and dinner. -Increase water intake to eight cups daily to help the fiber work better. -Prescribe antispasmodic medication (dicyclomine) for use as needed, up to twice daily. -During your trip to [place], consume only cooked foods and avoid local water. -Avoid using probiotics before your trip.  FAMILY HISTORY OF COLON CANCER: Your mother was recently diagnosed with colon cancer, which means you need to have colonoscopies more frequently. -Schedule your next colonoscopy for 2029.  _______________________________________________________  If your blood pressure at your visit was 140/90 or greater, please contact your primary care physician to follow up on this.  _______________________________________________________  If you are age 67 or older, your body mass index should be between 23-30. Your Body mass index is 26.63 kg/m. If this is out of the aforementioned range listed, please consider follow up with your Primary Care Provider.  If you are age 67 or younger, your body mass index should be between 19-25. Your Body mass index is 26.63 kg/m. If this is out of the aformentioned range listed, please consider follow up with your Primary Care Provider.   ________________________________________________________  The [facility] providers would like to encourage you to use MYCHART to communicate with providers for non-urgent requests or questions.  Due  to long hold times on the telephone, sending your provider a message by [facility] may be a faster and more efficient way to get a response.  Please allow 48 business hours for a response.  Please remember that this is for non-urgent requests.  _______________________________________________________  [facility] is using a team-based approach to care.  Your team is made up of your doctor and two to three APPS. Our APPS (Nurse Practitioners and Physician Assistants) work with your physician to ensure care continuity for you. They are fully qualified to address your health concerns and develop a treatment plan. They communicate directly with your gastroenterologist to care for you. Seeing the Advanced Practice Practitioners on your physician's team can help you by facilitating care more promptly, often allowing for earlier appointments, access to diagnostic testing, procedures, and other specialty referrals.    I appreciate the  opportunity to care for you  Thank You   Kavitha Nandigam , MD

## 2024-01-11 NOTE — Telephone Encounter (Signed)
 Pharmacy Patient Advocate Encounter  Received notification from [facility] that Prior Authorization for Dicyclomine HCl 10MG  capsules has been APPROVED from 01-11-2024 to 04-04-2024. Ran test claim, Copay is $1.48 for 30 day supply. This test claim was processed through [facility]- copay amounts may vary at other pharmacies due to pharmacy/plan contracts, or as the patient moves through the different stages of their insurance plan.   PA #/Case ID/Reference #: [id number]

## 2024-01-31 ENCOUNTER — Other Ambulatory Visit

## 2024-01-31 DIAGNOSIS — E559 Vitamin D deficiency, unspecified: Secondary | ICD-10-CM | POA: Diagnosis not present

## 2024-01-31 DIAGNOSIS — G43709 Chronic migraine without aura, not intractable, without status migrainosus: Secondary | ICD-10-CM | POA: Diagnosis not present

## 2024-01-31 DIAGNOSIS — E7800 Pure hypercholesterolemia, unspecified: Secondary | ICD-10-CM

## 2024-02-01 ENCOUNTER — Ambulatory Visit: Payer: Self-pay | Admitting: Family Medicine

## 2024-02-01 ENCOUNTER — Other Ambulatory Visit: Payer: Self-pay | Admitting: Family Medicine

## 2024-02-01 DIAGNOSIS — E7800 Pure hypercholesterolemia, unspecified: Secondary | ICD-10-CM

## 2024-02-01 DIAGNOSIS — E663 Overweight: Secondary | ICD-10-CM

## 2024-02-01 LAB — CMP14+EGFR
ALT: 16 IU/L (ref 0–32)
AST: 20 IU/L (ref 0–40)
Albumin: 4.4 g/dL (ref 3.9–4.9)
Alkaline Phosphatase: 102 IU/L (ref 49–135)
BUN/Creatinine Ratio: 16 (ref 12–28)
BUN: 14 mg/dL (ref 8–27)
Bilirubin Total: 0.5 mg/dL (ref 0.0–1.2)
CO2: 24 mmol/L (ref 20–29)
Calcium: 9.1 mg/dL (ref 8.7–10.3)
Chloride: 102 mmol/L (ref 96–106)
Creatinine, Ser: 0.85 mg/dL (ref 0.57–1.00)
Globulin, Total: 2.2 g/dL (ref 1.5–4.5)
Glucose: 90 mg/dL (ref 70–99)
Potassium: 3.7 mmol/L (ref 3.5–5.2)
Sodium: 141 mmol/L (ref 134–144)
Total Protein: 6.6 g/dL (ref 6.0–8.5)
eGFR: 75 mL/min/1.73 (ref >59)

## 2024-02-01 LAB — VITAMIN D 25 HYDROXY (VIT D DEFICIENCY, FRACTURES): Vit D, 25-Hydroxy: 53.9 ng/mL (ref 30.0–100.0)

## 2024-02-01 LAB — LIPID PANEL
Chol/HDL Ratio: 2.8 ratio (ref 0.0–4.4)
Cholesterol, Total: 202 mg/dL — ABNORMAL HIGH (ref 100–199)
HDL: 73 mg/dL (ref >39)
LDL Chol Calc (NIH): 116 mg/dL — ABNORMAL HIGH (ref 0–99)
Triglycerides: 73 mg/dL (ref 0–149)
VLDL Cholesterol Cal: 13 mg/dL (ref 5–40)

## 2024-02-01 LAB — CBC
Hematocrit: 37.1 % (ref 34.0–46.6)
Hemoglobin: 12.7 g/dL (ref 11.1–15.9)
MCH: 34 pg — ABNORMAL HIGH (ref 26.6–33.0)
MCHC: 34.2 g/dL (ref 31.5–35.7)
MCV: 100 fL — ABNORMAL HIGH (ref 79–97)
Platelets: 260 x10E3/uL (ref 150–450)
RBC: 3.73 x10E6/uL — ABNORMAL LOW (ref 3.77–5.28)
RDW: 11.8 % (ref 11.7–15.4)
WBC: 5.4 x10E3/uL (ref 3.4–10.8)

## 2024-02-01 LAB — TSH: TSH: 3.2 u[IU]/mL (ref 0.450–4.500)

## 2024-02-14 ENCOUNTER — Other Ambulatory Visit (HOSPITAL_BASED_OUTPATIENT_CLINIC_OR_DEPARTMENT_OTHER)

## 2024-02-21 ENCOUNTER — Ambulatory Visit (HOSPITAL_BASED_OUTPATIENT_CLINIC_OR_DEPARTMENT_OTHER)

## 2024-02-23 ENCOUNTER — Other Ambulatory Visit: Payer: Self-pay | Admitting: Family Medicine

## 2024-02-23 DIAGNOSIS — G43809 Other migraine, not intractable, without status migrainosus: Secondary | ICD-10-CM

## 2024-03-06 ENCOUNTER — Ambulatory Visit (HOSPITAL_BASED_OUTPATIENT_CLINIC_OR_DEPARTMENT_OTHER)
Admission: RE | Admit: 2024-03-06 | Discharge: 2024-03-06 | Disposition: A | Payer: Self-pay | Source: Ambulatory Visit | Attending: Family Medicine

## 2024-03-06 DIAGNOSIS — E7800 Pure hypercholesterolemia, unspecified: Secondary | ICD-10-CM | POA: Insufficient documentation

## 2024-03-06 DIAGNOSIS — E663 Overweight: Secondary | ICD-10-CM | POA: Insufficient documentation

## 2024-03-13 ENCOUNTER — Ambulatory Visit: Payer: Self-pay | Admitting: Family Medicine

## 2024-03-21 ENCOUNTER — Telehealth: Payer: Self-pay | Admitting: Hematology and Oncology

## 2024-03-21 NOTE — Telephone Encounter (Signed)
 I spoke w pt regarding 10/08/24 appt being rescheduled go 10/09/24. Patient is aware of new appt date and time.

## 2024-03-25 ENCOUNTER — Encounter: Payer: Self-pay | Admitting: Family Medicine

## 2024-03-25 DIAGNOSIS — G43809 Other migraine, not intractable, without status migrainosus: Secondary | ICD-10-CM

## 2024-03-26 ENCOUNTER — Other Ambulatory Visit: Payer: Self-pay | Admitting: Gastroenterology

## 2024-03-27 ENCOUNTER — Encounter: Payer: Self-pay | Admitting: Family Medicine

## 2024-03-27 MED ORDER — RIZATRIPTAN BENZOATE 10 MG PO TABS: 10.0000 mg | ORAL_TABLET | ORAL | 0 refills | Status: DC | PRN

## 2024-03-27 NOTE — Telephone Encounter (Signed)
 Called patient to advise the medication has now been sent over to their preferred pharmacy. Patient verbalized understanding.

## 2024-05-08 ENCOUNTER — Other Ambulatory Visit: Payer: Self-pay | Admitting: Family Medicine

## 2024-05-08 DIAGNOSIS — G43709 Chronic migraine without aura, not intractable, without status migrainosus: Secondary | ICD-10-CM

## 2024-05-14 ENCOUNTER — Encounter: Payer: Medicare HMO | Admitting: Nurse Practitioner

## 2024-10-08 ENCOUNTER — Other Ambulatory Visit

## 2024-10-08 ENCOUNTER — Ambulatory Visit: Admitting: Hematology and Oncology

## 2024-10-09 ENCOUNTER — Inpatient Hospital Stay

## 2024-10-09 ENCOUNTER — Inpatient Hospital Stay: Admitting: Hematology and Oncology

## 2024-11-19 ENCOUNTER — Ambulatory Visit

## 2025-01-11 ENCOUNTER — Encounter: Payer: Self-pay | Admitting: Family Medicine
